# Patient Record
Sex: Female | Born: 1955
Health system: Southern US, Community
[De-identification: ages and names within clinical notes are randomized; demographics above are authoritative.]

## PROBLEM LIST (undated history)

## (undated) DIAGNOSIS — R569 Unspecified convulsions: Secondary | ICD-10-CM

## (undated) DIAGNOSIS — R0683 Snoring: Secondary | ICD-10-CM

## (undated) DIAGNOSIS — R112 Nausea with vomiting, unspecified: Secondary | ICD-10-CM

## (undated) DIAGNOSIS — Z98811 Dental restoration status: Secondary | ICD-10-CM

## (undated) DIAGNOSIS — I10 Essential (primary) hypertension: Secondary | ICD-10-CM

## (undated) DIAGNOSIS — F32A Depression, unspecified: Secondary | ICD-10-CM

## (undated) DIAGNOSIS — M26609 Unspecified temporomandibular joint disorder, unspecified side: Secondary | ICD-10-CM

## (undated) DIAGNOSIS — F329 Major depressive disorder, single episode, unspecified: Secondary | ICD-10-CM

## (undated) DIAGNOSIS — J302 Other seasonal allergic rhinitis: Secondary | ICD-10-CM

## (undated) DIAGNOSIS — Z9889 Other specified postprocedural states: Secondary | ICD-10-CM

## (undated) DIAGNOSIS — Z860100 Personal history of colon polyps, unspecified: Secondary | ICD-10-CM

## (undated) DIAGNOSIS — K219 Gastro-esophageal reflux disease without esophagitis: Secondary | ICD-10-CM

## (undated) DIAGNOSIS — F419 Anxiety disorder, unspecified: Secondary | ICD-10-CM

## (undated) DIAGNOSIS — G5601 Carpal tunnel syndrome, right upper limb: Secondary | ICD-10-CM

## (undated) DIAGNOSIS — Z973 Presence of spectacles and contact lenses: Secondary | ICD-10-CM

## (undated) DIAGNOSIS — M8580 Other specified disorders of bone density and structure, unspecified site: Secondary | ICD-10-CM

## (undated) HISTORY — DX: Personal history of colon polyps, unspecified: Z86.0100

## (undated) HISTORY — DX: Depression, unspecified: F32.A

## (undated) HISTORY — DX: Other specified disorders of bone density and structure, unspecified site: M85.80

## (undated) HISTORY — PX: LUMBAR DISC SURGERY: SHX700

## (undated) HISTORY — PX: TONSILLECTOMY AND ADENOIDECTOMY: SUR1326

## (undated) HISTORY — PX: CARDIAC CATHETERIZATION: SHX172

## (undated) HISTORY — PX: WISDOM TOOTH EXTRACTION: SHX21

## (undated) HISTORY — DX: Personal history of colonic polyps: Z86.010

## (undated) HISTORY — PX: SPINE SURGERY: SHX786

## (undated) HISTORY — DX: Gastro-esophageal reflux disease without esophagitis: K21.9

## (undated) HISTORY — DX: Major depressive disorder, single episode, unspecified: F32.9

## (undated) HISTORY — DX: Anxiety disorder, unspecified: F41.9

---

## 1992-07-31 HISTORY — PX: TUBAL LIGATION: SHX77

## 1998-05-03 ENCOUNTER — Other Ambulatory Visit: Admission: RE | Admit: 1998-05-03 | Discharge: 1998-05-03 | Payer: Self-pay | Admitting: Obstetrics and Gynecology

## 1999-07-07 ENCOUNTER — Other Ambulatory Visit: Admission: RE | Admit: 1999-07-07 | Discharge: 1999-07-07 | Payer: Self-pay | Admitting: Obstetrics and Gynecology

## 2000-10-22 ENCOUNTER — Other Ambulatory Visit: Admission: RE | Admit: 2000-10-22 | Discharge: 2000-10-22 | Payer: Self-pay | Admitting: Obstetrics and Gynecology

## 2001-12-06 ENCOUNTER — Other Ambulatory Visit: Admission: RE | Admit: 2001-12-06 | Discharge: 2001-12-06 | Payer: Self-pay | Admitting: Obstetrics and Gynecology

## 2002-07-17 ENCOUNTER — Emergency Department (HOSPITAL_COMMUNITY): Admission: EM | Admit: 2002-07-17 | Discharge: 2002-07-17 | Payer: Self-pay | Admitting: Emergency Medicine

## 2002-07-18 ENCOUNTER — Emergency Department (HOSPITAL_COMMUNITY): Admission: EM | Admit: 2002-07-18 | Discharge: 2002-07-19 | Payer: Self-pay | Admitting: Emergency Medicine

## 2002-11-29 ENCOUNTER — Encounter (INDEPENDENT_AMBULATORY_CARE_PROVIDER_SITE_OTHER): Payer: Self-pay | Admitting: Internal Medicine

## 2002-11-29 LAB — CONVERTED CEMR LAB

## 2002-12-08 ENCOUNTER — Other Ambulatory Visit: Admission: RE | Admit: 2002-12-08 | Discharge: 2002-12-08 | Payer: Self-pay | Admitting: Obstetrics and Gynecology

## 2003-04-27 ENCOUNTER — Encounter: Admission: RE | Admit: 2003-04-27 | Discharge: 2003-04-27 | Payer: Self-pay | Admitting: Family Medicine

## 2003-04-27 ENCOUNTER — Encounter: Payer: Self-pay | Admitting: Family Medicine

## 2003-05-15 ENCOUNTER — Ambulatory Visit (HOSPITAL_COMMUNITY): Admission: RE | Admit: 2003-05-15 | Discharge: 2003-05-15 | Payer: Self-pay | Admitting: Gastroenterology

## 2003-05-15 ENCOUNTER — Encounter (INDEPENDENT_AMBULATORY_CARE_PROVIDER_SITE_OTHER): Payer: Self-pay | Admitting: Specialist

## 2003-05-15 HISTORY — PX: ESOPHAGEAL DILATION: SHX303

## 2003-10-14 ENCOUNTER — Encounter: Admission: RE | Admit: 2003-10-14 | Discharge: 2003-10-14 | Payer: Self-pay | Admitting: Allergy and Immunology

## 2003-12-29 ENCOUNTER — Other Ambulatory Visit: Admission: RE | Admit: 2003-12-29 | Discharge: 2003-12-29 | Payer: Self-pay | Admitting: Obstetrics and Gynecology

## 2004-07-08 ENCOUNTER — Emergency Department (HOSPITAL_COMMUNITY): Admission: EM | Admit: 2004-07-08 | Discharge: 2004-07-08 | Payer: Self-pay | Admitting: Family Medicine

## 2004-07-14 ENCOUNTER — Ambulatory Visit: Payer: Self-pay | Admitting: Family Medicine

## 2004-09-12 ENCOUNTER — Ambulatory Visit: Payer: Self-pay | Admitting: Family Medicine

## 2004-10-19 ENCOUNTER — Ambulatory Visit: Payer: Self-pay | Admitting: Family Medicine

## 2004-10-25 ENCOUNTER — Encounter: Admission: RE | Admit: 2004-10-25 | Discharge: 2005-01-23 | Payer: Self-pay | Admitting: Family Medicine

## 2005-02-09 ENCOUNTER — Other Ambulatory Visit: Admission: RE | Admit: 2005-02-09 | Discharge: 2005-02-09 | Payer: Self-pay | Admitting: Obstetrics and Gynecology

## 2005-07-06 ENCOUNTER — Emergency Department (HOSPITAL_COMMUNITY): Admission: EM | Admit: 2005-07-06 | Discharge: 2005-07-06 | Payer: Self-pay | Admitting: Family Medicine

## 2005-07-07 ENCOUNTER — Encounter (INDEPENDENT_AMBULATORY_CARE_PROVIDER_SITE_OTHER): Payer: Self-pay | Admitting: Specialist

## 2005-07-07 ENCOUNTER — Inpatient Hospital Stay (HOSPITAL_COMMUNITY): Admission: EM | Admit: 2005-07-07 | Discharge: 2005-07-08 | Payer: Self-pay | Admitting: Emergency Medicine

## 2005-07-07 ENCOUNTER — Encounter: Payer: Self-pay | Admitting: Family Medicine

## 2005-07-07 HISTORY — PX: CHOLECYSTECTOMY: SHX55

## 2005-09-19 ENCOUNTER — Ambulatory Visit: Payer: Self-pay | Admitting: Family Medicine

## 2005-10-18 ENCOUNTER — Ambulatory Visit: Payer: Self-pay | Admitting: Family Medicine

## 2005-11-27 ENCOUNTER — Ambulatory Visit: Payer: Self-pay | Admitting: Family Medicine

## 2006-03-03 ENCOUNTER — Ambulatory Visit: Payer: Self-pay | Admitting: Internal Medicine

## 2006-03-03 ENCOUNTER — Observation Stay (HOSPITAL_COMMUNITY): Admission: EM | Admit: 2006-03-03 | Discharge: 2006-03-04 | Payer: Self-pay | Admitting: Emergency Medicine

## 2006-03-05 ENCOUNTER — Ambulatory Visit: Payer: Self-pay | Admitting: Family Medicine

## 2006-03-21 ENCOUNTER — Ambulatory Visit: Payer: Self-pay

## 2006-08-29 ENCOUNTER — Ambulatory Visit: Payer: Self-pay | Admitting: Family Medicine

## 2006-09-19 ENCOUNTER — Ambulatory Visit: Payer: Self-pay | Admitting: Family Medicine

## 2007-02-21 ENCOUNTER — Encounter (INDEPENDENT_AMBULATORY_CARE_PROVIDER_SITE_OTHER): Payer: Self-pay | Admitting: Internal Medicine

## 2007-02-21 DIAGNOSIS — F410 Panic disorder [episodic paroxysmal anxiety] without agoraphobia: Secondary | ICD-10-CM | POA: Insufficient documentation

## 2007-02-21 DIAGNOSIS — Z860100 Personal history of colon polyps, unspecified: Secondary | ICD-10-CM | POA: Insufficient documentation

## 2007-02-21 DIAGNOSIS — K219 Gastro-esophageal reflux disease without esophagitis: Secondary | ICD-10-CM | POA: Insufficient documentation

## 2007-02-21 DIAGNOSIS — F17200 Nicotine dependence, unspecified, uncomplicated: Secondary | ICD-10-CM | POA: Insufficient documentation

## 2007-02-27 ENCOUNTER — Ambulatory Visit: Payer: Self-pay | Admitting: Family Medicine

## 2007-02-27 DIAGNOSIS — I10 Essential (primary) hypertension: Secondary | ICD-10-CM | POA: Insufficient documentation

## 2007-02-28 LAB — CONVERTED CEMR LAB
BUN: 15 mg/dL (ref 6–23)
CO2: 33 meq/L — ABNORMAL HIGH (ref 19–32)
Calcium: 9.7 mg/dL (ref 8.4–10.5)
Chloride: 102 meq/L (ref 96–112)
Cholesterol: 186 mg/dL (ref 0–200)
Creatinine, Ser: 0.7 mg/dL (ref 0.4–1.2)
GFR calc Af Amer: 113 mL/min
GFR calc non Af Amer: 93 mL/min
Glucose, Bld: 92 mg/dL (ref 70–99)
HDL: 26.9 mg/dL — ABNORMAL LOW (ref >39.0)
LDL Cholesterol: 136 mg/dL — ABNORMAL HIGH (ref 0–99)
Potassium: 3.9 meq/L (ref 3.5–5.1)
Sodium: 140 meq/L (ref 135–145)
TSH: 1.17 microintl units/mL (ref 0.35–5.50)
Total CHOL/HDL Ratio: 6.9
Triglycerides: 115 mg/dL (ref 0–149)
VLDL: 23 mg/dL (ref 0–40)

## 2007-03-05 ENCOUNTER — Encounter (INDEPENDENT_AMBULATORY_CARE_PROVIDER_SITE_OTHER): Payer: Self-pay | Admitting: Internal Medicine

## 2007-03-14 ENCOUNTER — Telehealth (INDEPENDENT_AMBULATORY_CARE_PROVIDER_SITE_OTHER): Payer: Self-pay | Admitting: *Deleted

## 2007-05-08 HISTORY — PX: CARPAL TUNNEL RELEASE: SHX101

## 2007-05-22 ENCOUNTER — Encounter (INDEPENDENT_AMBULATORY_CARE_PROVIDER_SITE_OTHER): Payer: Self-pay | Admitting: Internal Medicine

## 2007-09-11 ENCOUNTER — Ambulatory Visit: Payer: Self-pay | Admitting: Family Medicine

## 2007-09-11 DIAGNOSIS — E782 Mixed hyperlipidemia: Secondary | ICD-10-CM | POA: Insufficient documentation

## 2007-09-12 LAB — CONVERTED CEMR LAB
AST: 19 units/L (ref 0–37)
BUN: 11 mg/dL (ref 6–23)
CO2: 32 meq/L (ref 19–32)
Calcium: 9.8 mg/dL (ref 8.4–10.5)
Chloride: 101 meq/L (ref 96–112)
Cholesterol: 175 mg/dL (ref 0–200)
Creatinine, Ser: 0.7 mg/dL (ref 0.4–1.2)
HDL: 35.7 mg/dL — ABNORMAL LOW (ref >39.0)
LDL Cholesterol: 121 mg/dL — ABNORMAL HIGH (ref 0–99)
Potassium: 3.9 meq/L (ref 3.5–5.1)
Sodium: 139 meq/L (ref 135–145)
Triglycerides: 92 mg/dL (ref 0–149)
VLDL: 18 mg/dL (ref 0–40)

## 2008-03-09 ENCOUNTER — Ambulatory Visit: Payer: Self-pay | Admitting: Family Medicine

## 2008-03-12 LAB — CONVERTED CEMR LAB
BUN: 12 mg/dL (ref 6–23)
Calcium: 9.4 mg/dL (ref 8.4–10.5)
Chloride: 104 meq/L (ref 96–112)
Cholesterol: 194 mg/dL (ref 0–200)
Creatinine, Ser: 0.7 mg/dL (ref 0.4–1.2)
GFR calc Af Amer: 112 mL/min
GFR calc non Af Amer: 93 mL/min
Glucose, Bld: 83 mg/dL (ref 70–99)
HDL: 33.3 mg/dL — ABNORMAL LOW (ref >39.0)
LDL Cholesterol: 145 mg/dL — ABNORMAL HIGH (ref 0–99)
Sodium: 141 meq/L (ref 135–145)
Total CHOL/HDL Ratio: 5.8
Triglycerides: 79 mg/dL (ref 0–149)

## 2008-09-01 ENCOUNTER — Ambulatory Visit: Payer: Self-pay | Admitting: Family Medicine

## 2008-09-03 ENCOUNTER — Telehealth (INDEPENDENT_AMBULATORY_CARE_PROVIDER_SITE_OTHER): Payer: Self-pay | Admitting: Internal Medicine

## 2008-09-03 LAB — CONVERTED CEMR LAB
Cholesterol: 158 mg/dL (ref 0–200)
LDL Cholesterol: 109 mg/dL — ABNORMAL HIGH (ref 0–99)
Total CHOL/HDL Ratio: 5.4
Triglycerides: 101 mg/dL (ref 0–149)
VLDL: 20 mg/dL (ref 0–40)

## 2008-09-15 ENCOUNTER — Ambulatory Visit: Payer: Self-pay | Admitting: Family Medicine

## 2008-09-15 DIAGNOSIS — H04129 Dry eye syndrome of unspecified lacrimal gland: Secondary | ICD-10-CM | POA: Insufficient documentation

## 2009-03-08 ENCOUNTER — Ambulatory Visit: Payer: Self-pay | Admitting: Family Medicine

## 2009-03-10 LAB — CONVERTED CEMR LAB
Cholesterol: 154 mg/dL (ref 0–200)
HDL: 30.9 mg/dL — ABNORMAL LOW (ref >39.00)
LDL Cholesterol: 107 mg/dL — ABNORMAL HIGH (ref 0–99)
Total CHOL/HDL Ratio: 5
Triglycerides: 79 mg/dL (ref 0.0–149.0)
VLDL: 15.8 mg/dL (ref 0.0–40.0)

## 2009-03-16 ENCOUNTER — Ambulatory Visit: Payer: Self-pay | Admitting: Family Medicine

## 2009-03-16 DIAGNOSIS — Z780 Asymptomatic menopausal state: Secondary | ICD-10-CM | POA: Insufficient documentation

## 2009-03-23 ENCOUNTER — Encounter: Payer: Self-pay | Admitting: Family Medicine

## 2009-05-06 LAB — HM MAMMOGRAPHY

## 2009-05-14 ENCOUNTER — Encounter (INDEPENDENT_AMBULATORY_CARE_PROVIDER_SITE_OTHER): Payer: Self-pay | Admitting: Internal Medicine

## 2009-05-14 LAB — HM COLONOSCOPY: HM Colonoscopy: NORMAL

## 2009-05-14 LAB — CONVERTED CEMR LAB: Pap Smear: NORMAL

## 2009-05-14 LAB — HM PAP SMEAR

## 2009-05-27 ENCOUNTER — Encounter (INDEPENDENT_AMBULATORY_CARE_PROVIDER_SITE_OTHER): Payer: Self-pay | Admitting: Internal Medicine

## 2009-07-31 HISTORY — PX: CERVICAL DISC SURGERY: SHX588

## 2009-09-06 ENCOUNTER — Ambulatory Visit: Payer: Self-pay | Admitting: Family Medicine

## 2009-09-06 ENCOUNTER — Encounter: Payer: Self-pay | Admitting: Family Medicine

## 2009-09-07 LAB — CONVERTED CEMR LAB
ALT: 26 units/L (ref 0–35)
AST: 22 units/L (ref 0–37)
CO2: 32 meq/L (ref 19–32)
Calcium: 9.3 mg/dL (ref 8.4–10.5)
Chloride: 106 meq/L (ref 96–112)
Cholesterol: 180 mg/dL (ref 0–200)
Creatinine, Ser: 0.7 mg/dL (ref 0.4–1.2)
GFR calc non Af Amer: 92 mL/min (ref >60)
Glucose, Bld: 86 mg/dL (ref 70–99)
HDL: 39.7 mg/dL (ref >39.00)
Potassium: 3.4 meq/L — ABNORMAL LOW (ref 3.5–5.1)
Sodium: 142 meq/L (ref 135–145)
TSH: 2.24 microintl units/mL (ref 0.35–5.50)
Triglycerides: 105 mg/dL (ref 0.0–149.0)
VLDL: 21 mg/dL (ref 0.0–40.0)
Vit D, 25-Hydroxy: 44 ng/mL (ref 30–89)

## 2009-09-08 ENCOUNTER — Ambulatory Visit: Payer: Self-pay | Admitting: Family Medicine

## 2009-11-23 ENCOUNTER — Ambulatory Visit: Payer: Self-pay | Admitting: Family Medicine

## 2009-12-03 ENCOUNTER — Emergency Department (HOSPITAL_COMMUNITY): Admission: EM | Admit: 2009-12-03 | Discharge: 2009-12-03 | Payer: Self-pay | Admitting: Emergency Medicine

## 2010-02-20 ENCOUNTER — Emergency Department (HOSPITAL_COMMUNITY): Admission: EM | Admit: 2010-02-20 | Discharge: 2010-02-21 | Payer: Self-pay | Admitting: Emergency Medicine

## 2010-02-21 ENCOUNTER — Ambulatory Visit: Payer: Self-pay | Admitting: Cardiology

## 2010-02-21 LAB — CONVERTED CEMR LAB: POC INR: 4.5

## 2010-02-24 ENCOUNTER — Ambulatory Visit: Payer: Self-pay | Admitting: Family Medicine

## 2010-02-24 DIAGNOSIS — M5412 Radiculopathy, cervical region: Secondary | ICD-10-CM | POA: Insufficient documentation

## 2010-02-26 ENCOUNTER — Encounter: Admission: RE | Admit: 2010-02-26 | Discharge: 2010-02-26 | Payer: Self-pay | Admitting: Family Medicine

## 2010-02-28 ENCOUNTER — Ambulatory Visit: Payer: Self-pay | Admitting: Cardiovascular Disease

## 2010-02-28 ENCOUNTER — Encounter: Payer: Self-pay | Admitting: Family Medicine

## 2010-02-28 LAB — CONVERTED CEMR LAB: POC INR: 2.8

## 2010-03-03 ENCOUNTER — Encounter (INDEPENDENT_AMBULATORY_CARE_PROVIDER_SITE_OTHER): Payer: Self-pay | Admitting: *Deleted

## 2010-03-07 ENCOUNTER — Ambulatory Visit: Payer: Self-pay | Admitting: Cardiovascular Disease

## 2010-03-07 LAB — CONVERTED CEMR LAB: POC INR: 3.1

## 2010-03-16 ENCOUNTER — Ambulatory Visit: Payer: Self-pay | Admitting: Cardiology

## 2010-03-16 LAB — CONVERTED CEMR LAB: POC INR: 2.6

## 2010-04-06 ENCOUNTER — Ambulatory Visit: Payer: Self-pay | Admitting: Cardiovascular Disease

## 2010-04-06 LAB — CONVERTED CEMR LAB: POC INR: 1.8

## 2010-04-20 ENCOUNTER — Ambulatory Visit: Payer: Self-pay

## 2010-04-20 ENCOUNTER — Ambulatory Visit: Payer: Self-pay | Admitting: Cardiology

## 2010-04-20 ENCOUNTER — Encounter: Payer: Self-pay | Admitting: Family Medicine

## 2010-04-20 LAB — CONVERTED CEMR LAB: POC INR: 2.4

## 2010-04-22 ENCOUNTER — Telehealth: Payer: Self-pay | Admitting: Family Medicine

## 2010-05-02 ENCOUNTER — Ambulatory Visit: Payer: Self-pay | Admitting: Family Medicine

## 2010-05-02 ENCOUNTER — Encounter: Payer: Self-pay | Admitting: Family Medicine

## 2010-05-23 ENCOUNTER — Ambulatory Visit: Payer: Self-pay | Admitting: Internal Medicine

## 2010-05-23 LAB — CONVERTED CEMR LAB: POC INR: 2.2

## 2010-05-30 ENCOUNTER — Encounter: Admission: RE | Admit: 2010-05-30 | Discharge: 2010-05-30 | Payer: Self-pay | Admitting: Neurosurgery

## 2010-06-13 ENCOUNTER — Ambulatory Visit: Payer: Self-pay | Admitting: Internal Medicine

## 2010-06-13 LAB — CONVERTED CEMR LAB: POC INR: 2.5

## 2010-07-11 ENCOUNTER — Ambulatory Visit: Payer: Self-pay | Admitting: Cardiology

## 2010-07-11 LAB — CONVERTED CEMR LAB: POC INR: 2.8

## 2010-08-02 ENCOUNTER — Telehealth: Payer: Self-pay | Admitting: Family Medicine

## 2010-08-08 ENCOUNTER — Ambulatory Visit: Admission: RE | Admit: 2010-08-08 | Discharge: 2010-08-08 | Payer: Self-pay | Source: Home / Self Care

## 2010-08-08 LAB — CONVERTED CEMR LAB: INR: 2.7

## 2010-09-01 NOTE — Progress Notes (Signed)
Summary: Off Coumadin for surgery   ---- Converted from flag ---- ---- 04/20/2010 9:34 AM, Ruthe Mannan MD wrote: From my standpoint she would be ok off coumadin, but please check with cards too. [name]  ---- 04/20/2010 9:30 AM, Weston Brass PharmD wrote: Dr. Dayton Martes, Taylor Stanley is scheduled to have neck surgery on 05/12/10 and needs to be off of Coumadin x 5 days prior to surgery. Pt is on Coumadin for DVT in June, 2011. Is pt cleared to be off of Coumadin? Does she need bridging with Lovenox? ------------------------------  Phone Note Outgoing Call   Call placed by: Weston Brass PharmD,  April 22, 2010 9:30 AM Call placed to: Patient Summary of Call: Spoke with pt.  She is aware that it is okay to hold her Coumadin.  She has instructions from neurologist.  Will restart Coumadin at MDs discretion at the normal dose.  She is scheduled for f/u INR testing on 10/18.  Initial call taken by: Weston Brass PharmD,  April 22, 2010 9:31 AM

## 2010-09-01 NOTE — Assessment & Plan Note (Signed)
Summary: 30 min apptROA XFER FROM [facility]   Vital Signs:  Patient profile:   55 year old female Height:      60.75 inches Weight:      136.25 pounds BMI:     26.05 Temp:     98.3 degrees F oral Pulse rate:   88 / minute Pulse rhythm:   regular BP sitting:   120 / 72  (left arm) Cuff size:   regular  Vitals Entered By: Delilah Shan CMA (AAMA) (September 08, 2009 10:01 AM) CC: 30 minute appointment (BDB)   History of Present Illness: Taylor yo here to establish care with me ([facility] of [facility]).  Tobacco abuse- has smoke 1 ppd for 35 years.  Quit cold [place] years ago, only lasted 5 weeks. Really ready to quit now, taking care of 4 year old grandson, knows its bad for him to be around smoke.  Tried Chantix, made her feel "crazy."  No SI or HI, had strange dreams and jitteryness. Has never tried patches or gums.  Anxiety/depression- on Zoloft 50 mg daily, feels like its working well, takes the edge off. Also trying to walk more to reduce stress.    HLD- FLP this week- LDL 119 (inc from 107), HDL improved to 39, TG stable. Still taking Lovaza 1 gm daily.  No issues with it, admits to eating very fatty foods over past several months during holidays.  Wants to do better.  wil start walking more as well.  Osteopenia- Dexa in 10/10 showed osteopenia (-1.1 in left femoral neck), otherwise normal. Vit D normal, continues to take Vit D 1000 units daily.  HTN- stable on Benicar- HCT 40-12.5 mg daily.  No CP, SOB, LE edema, blurred vision.  Well woman- UTD colonscopy. Mammogram, DEXA, pap all in 10/10 (Dr. Huntley Dec).  Current Medications (verified): 1)  Clarinex 5 Mg Tabs (Desloratadine) .... Take 1 Tablet By Mouth Once A Day 2)  Benicar Hct 40-12.5 Mg  Tabs (Olmesartan Medoxomil-Hctz) .... Take One By Mouth Once A Day 3)  Nitrostat 0.4 Mg  Subl (Nitroglycerin) .... As Needed 4)  Asmanex 60 Metered Doses 220 Mcg/inh  Aepb (Mometasone Furoate) .Marland Kitchen.. 1 Puff  Once Daily 5)  Nasonex 50 Mcg/act   Susp (Mometasone Furoate) .Marland Kitchen.. 1 Spray in Each Nostril Once Daily 6)  Nexium 40 Mg  Cpdr (Esomeprazole Magnesium) .... Take 1 Tablet By Mouth Once A Day 7)  Lovaza 1 Gm Caps (Omega-3-Acid Ethyl Esters) .... Take 4 Caps Once Daily--May Do in Divided Dose 8)  Zoloft 100 Mg Tabs (Sertraline Hcl) .... Take One Half  By Mouth Daily 9)  Restasis 0.05 % Emul (Cyclosporine) .Marland Kitchen.. 1 Drop Two Times A Day Both Eyes 10)  Vitamin E 600 Unit  Caps (Vitamin E) .... 200 International Units Daily 11)  Vitamin D 1000 Unit  Tabs (Cholecalciferol) .... Once Daily 12)  Wellbutrin Sr 150 Mg Xr12h-Tab (Bupropion Hcl) .Marland Kitchen.. 1 Tab Po Once Daily For 3 Days; Increase To 1 Tab Po  Twice Daily;  Continue For 7-12 Weeks  Allergies: 1)  ! Penicillin V Potassium (Penicillin V Potassium) 2)  Lodine  Review of Systems      See HPI General:  Denies chills, fever, and malaise. Eyes:  Denies blurring. ENT:  Denies difficulty swallowing. CV:  Denies chest pain or discomfort, difficulty breathing at night, difficulty breathing while lying down, leg cramps with exertion, and lightheadness. Resp:  Denies cough and shortness of breath. GI:  Denies abdominal pain,  bloody stools, and change in bowel habits. GU:  Denies abnormal vaginal bleeding and discharge. MS:  Denies joint pain, joint redness, and joint swelling. Derm:  Denies rash. Neuro:  Denies visual disturbances and weakness. Psych:  Denies anxiety and depression. Endo:  Denies cold intolerance and heat intolerance.  Physical Exam  General:  alert, well-developed, well-nourished, and well-hydrated.   Eyes:  pupils equal, pupils round, and no injection.   Mouth:  Oral mucosa and oropharynx without lesions or exudates.  Teeth in good repair. Lungs:  normal respiratory effort, no intercostal retractions, no accessory muscle use, and normal breath sounds.   Heart:  normal rate, regular rhythm, and no murmur.   Extremities:  no edema Skin:  turgor normal and color normal.    Psych:  normally interactive, flat affect, and subdued.     Impression & Recommendations:  Problem # 1:  HYPERLIPIDEMIA (ICD-272.2) Assessment Deteriorated Deteriorated slightly.  WIll not change meds at this time, still within goal of <130.  Discussed diet and exercise.  Recheck in August. Her updated medication list for this problem includes:    Lovaza 1 Gm Caps (Omega-3-acid ethyl esters) .Marland Kitchen... Take 4 caps once daily--may do in divided dose  Problem # 2:  TOBACCO ABUSE (ICD-305.1) Assessment: Unchanged Ready to quit!  Time spent with patient 35 minutes, more than 50% of this time was spent counseling patient on tobacco cessation. Will try Zyban in addition to gums/patches.  Set a quit date for 4 weeks from today.  She will f/u with me in 2 months.  Problem # 3:  OSTEOPENIA-DEXA (ICD-733.90) Assessment: Unchanged Advised to add 1000 mgs daily of Calcium to her current Vit D supplementation. Her updated medication list for this problem includes:    Vitamin D 1000 Unit Tabs (Cholecalciferol) ..... Once daily  Problem # 4:  HYPERTENSION, BENIGN ESSENTIAL, CONTROLLED (ICD-401.1) Assessment: Unchanged Controlled.  Continue current meds. Her updated medication list for this problem includes:    Benicar Hct 40-12.5 Mg Tabs (Olmesartan medoxomil-hctz) .Marland Kitchen... Take one by mouth once a day  Complete Medication List: 1)  Clarinex 5 Mg Tabs (Desloratadine) .... Take 1 tablet by mouth once a day 2)  Benicar Hct 40-12.5 Mg Tabs (Olmesartan medoxomil-hctz) .... Take one by mouth once a day 3)  Nitrostat 0.4 Mg Subl (Nitroglycerin) .... As needed 4)  Asmanex 60 Metered Doses 220 Mcg/inh Aepb (Mometasone furoate) .Marland Kitchen.. 1 puff  once daily 5)  Nasonex 50 Mcg/act Susp (Mometasone furoate) .Marland Kitchen.. 1 spray in each nostril once daily 6)  Nexium 40 Mg Cpdr (Esomeprazole magnesium) .... Take 1 tablet by mouth once a day 7)  Lovaza 1 Gm Caps (Omega-3-acid ethyl esters) .... Take 4 caps once daily--may do in  divided dose 8)  Zoloft 100 Mg Tabs (Sertraline hcl) .... Take one half  by mouth daily 9)  Restasis 0.05 % Emul (Cyclosporine) .Marland Kitchen.. 1 drop two times a day both eyes 10)  Vitamin E 600 Unit Caps (Vitamin e) .... 200 international units daily 11)  Vitamin D 1000 Unit Tabs (Cholecalciferol) .... Once daily 12)  Wellbutrin Sr 150 Mg Xr12h-tab (Bupropion hcl) .Marland Kitchen.. 1 tab po once daily for 3 days; increase to 1 tab po  twice daily;  continue for 7-12 weeks  Patient Instructions: 1)  Nice to meet you, Ms.Seckinger. 2)  Please come back to see me in 2 months to discuss your smoking. 3)  Stop smoking tips: Choose a quit date. Cut down before the quit date. Decide what  you will do as a substitute when you feel the urge to smoke(gum, toothpick, exercise).  4)  Make a lab appointment on the way out to recheck your cholesterol. Prescriptions: WELLBUTRIN SR 150 MG XR12H-TAB (BUPROPION HCL) 1 tab po once daily for 3 days; increase to 1 tab po  twice daily;  continue for 7-12 weeks  #60 x 3   Entered and Authorized by:   Ruthe Mannan MD   Signed by:   Ruthe Mannan MD on 09/08/2009   Method used:   [facility]        [facility] [address] [phone]* (retail)       [address]       [place], [place]  [address]       Ph: [phone] or [phone]       Fax: [phone]   RxID:   [id number]   Current Allergies (reviewed today): ! PENICILLIN V POTASSIUM (PENICILLIN V POTASSIUM) LODINE  Flex Sig Next Due:  Not Indicated Hemoccult Next Due:  Not Indicated Last PAP:  Done/GYN (11/29/2002 10:22:47 AM) PAP Result Date:  05/14/2009 PAP Result:  normal PAP Next Due:  2 yr Last Mammogram:  Done/GYN (01/28/2006 10:22:47 AM) Mammogram Result Date:  05/06/2009 Mammogram Result:  normal Mammogram Next Due:  1 yr

## 2010-09-01 NOTE — Progress Notes (Signed)
Summary: LOVAZA   Phone Note Refill Request Message from:  [phone] on August 02, 2010 2:18 PM  Refills Requested: Medication #1:  LOVAZA 1 GM CAPS take 4 caps once daily--may do in divided dose [BMN]   Last Refilled: 08/18/2009 ok to refill?    Method Requested: Electronic Initial call taken by: Mervin Hack CMA (AAMA),  August 02, 2010 2:18 PM    Prescriptions: LOVAZA 1 GM CAPS (OMEGA-3-ACID ETHYL ESTERS) take 4 caps once daily--may do in divided dose Brand medically necessary #360 x 1   Entered and Authorized by:   Ruthe Mannan MD   Signed by:   Ruthe Mannan MD on 08/02/2010   Method used:   [facility]        [facility] [address] [phone]* (retail)       [address]       [place], [place]  [address]       Ph: [phone] or [phone]       Fax: [phone]   RxID:   [id number]

## 2010-09-01 NOTE — Medication Information (Signed)
Summary: Taylor Stanley  Anticoagulant Therapy  Managed by: Bethena Midget, RN, BSN PCP: Clifton Custard MD, Virgilio Belling MD: Eden Emms MD,Peter Indication 1: DVT Lab Used: [facility] [place] Site: [address] INR POC 1.8 INR RANGE 2.0-3.0  Dietary changes: no    Health status changes: no    Bleeding/hemorrhagic complications: no    Recent/future hospitalizations: no    Any changes in medication regimen? no    Recent/future dental: no  Any missed doses?: yes     Details: missed 1/2 yesterday  Is patient compliant with meds? yes       Allergies: 1)  ! Penicillin V Potassium (Penicillin V Potassium) 2)  Lodine  Anticoagulation Management History:      The patient is taking warfarin and comes in today for a routine follow up visit.  Negative risk factors for bleeding include an age less than 71 years old.  The bleeding index is 'low risk'.  Positive CHADS2 values include History of HTN.  Negative CHADS2 values include Age > 29 years old.  Anticoagulation responsible provider: Eden Emms MD,Peter.  INR POC: 1.8.  Cuvette Lot#: [id number].  Exp: 05/2011.    Anticoagulation Management Assessment/Plan:      The patient's current anticoagulation dose is Coumadin 5 mg tabs: take as directed by [facility].  The target INR is 2.0-3.0.  The next INR is due 04/20/2010.  Anticoagulation instructions were given to patient.  Results were reviewed/authorized by Bethena Midget, RN, BSN.         Prior Anticoagulation Instructions: INR 2.6  Continue taking 1 tablet (5mg ) every day except take 1.5 tablets (7.5mg ) on Tuesdays, Thursdays, and Saturdays.  Recheck in 4 weeks.   Current Anticoagulation Instructions: INR 1.8 Since you missed the 1/2 tablet yesterday, take 1.5 tablets today. Then go back to regular schedule. We'll check again in 2 weeks to see if you're back in range.

## 2010-09-01 NOTE — Medication Information (Signed)
Summary: rov/ewj  Anticoagulant Therapy  Managed by: Lyna Poser, PharmD PCP: Clifton Custard MD, Virgilio Belling MD: Ladona Ridgel MD, Sharlot Gowda Indication 1: DVT Lab Used: [facility] [place] Site: [address] INR POC 2.5 INR RANGE 2.0-3.0  Dietary changes: no    Health status changes: yes       Details: back surgery on october 12th  Bleeding/hemorrhagic complications: no    Recent/future hospitalizations: no    Any changes in medication regimen? no    Recent/future dental: yes     Details: wisdom tooth needs to be taken out but hasn't scheduled   Any missed doses?: no       Is patient compliant with meds? yes       Allergies: 1)  ! Penicillin V Potassium (Penicillin V Potassium) 2)  Lodine  Anticoagulation Management History:      The patient is taking warfarin and comes in today for a routine follow up visit.  Negative risk factors for bleeding include an age less than 95 years old.  The bleeding index is 'low risk'.  Positive CHADS2 values include History of HTN.  Negative CHADS2 values include Age > 46 years old.  Anticoagulation responsible Derald Lorge: Ladona Ridgel MD, Sharlot Gowda.  INR POC: 2.5.  Cuvette Lot#: [id number].  Exp: 06/2011.    Anticoagulation Management Assessment/Plan:      The patient's current anticoagulation dose is Coumadin 5 mg tabs: take as directed by Coumadin Clinic.  The target INR is 2.0-3.0.  The next INR is due 07/11/2010.  Anticoagulation instructions were given to patient.  Results were reviewed/authorized by Lyna Poser, PharmD.         Prior Anticoagulation Instructions: INR 2.2  Continue on same dosage 1 tablet daily except 1/2 tablet on Tuesdays, Thursdays, and Saturdays.  Recheck in 3 weeks.    Current Anticoagulation Instructions: INR 2.5 continue taking a half tablet on tuesday, thursday, and saturday. Recheck in 4 weeks.

## 2010-09-01 NOTE — Medication Information (Signed)
Summary: rov/tm  Anticoagulant Therapy  Managed by: Bethena Midget, RN, BSN PCP: Clifton Custard MD, Virgilio Belling MD: Clifton James MD, Cristal Deer Indication 1: DVT Lab Used: [facility] [place] Site: [address] INR POC 3.1 INR RANGE 2.0-3.0  Dietary changes: no    Health status changes: no    Bleeding/hemorrhagic complications: no    Recent/future hospitalizations: no    Any changes in medication regimen? no    Recent/future dental: no  Any missed doses?: no       Is patient compliant with meds? yes       Allergies: 1)  ! Penicillin V Potassium (Penicillin V Potassium) 2)  Lodine  Anticoagulation Management History:      The patient is taking warfarin and comes in today for a routine follow up visit.  Negative risk factors for bleeding include an age less than 6 years old.  The bleeding index is 'low risk'.  Positive CHADS2 values include History of HTN.  Negative CHADS2 values include Age > 13 years old.  Anticoagulation responsible Jehu Mccauslin: Clifton James MD, Cristal Deer.  INR POC: 3.1.  Cuvette Lot#: [id number].  Exp: 05/2011.    Anticoagulation Management Assessment/Plan:      The patient's current anticoagulation dose is Coumadin 5 mg tabs: take as directed by [facility]in Clinic.  The target INR is 2.0-3.0.  The next INR is due 03/16/2010.  Anticoagulation instructions were given to patient.  Results were reviewed/authorized by Bethena Midget, RN, BSN.  She was notified by Bethena Midget, RN, BSN.         Prior Anticoagulation Instructions: INR 2.8 Continue 5mg s everyday except 2.5mg s on Tuesdays and Saturdays. Recheck in one week.   Current Anticoagulation Instructions: INR 3.1 Change dose 1 tablet everyday except 1/2 tablet on Tuesdays, Thursdays and Saturdays. Recheck in 10 days

## 2010-09-01 NOTE — Medication Information (Signed)
Summary: rov/tm  Anticoagulant Therapy  Managed by: Bethena Midget, RN, BSN PCP: Clifton Custard MD, Virgilio Belling MD: Juanda Chance MD, Bruce Indication 1: DVT Lab Used: [facility] [place] Site: [address] INR POC 2.6 INR RANGE 2.0-3.0  Dietary changes: no    Health status changes: no    Bleeding/hemorrhagic complications: no    Recent/future hospitalizations: no    Any changes in medication regimen? no    Recent/future dental: no  Any missed doses?: yes     Details: Might of taken a 1/2 instead of a whole tablet one day last Monday.    Is patient compliant with meds? yes       Allergies: 1)  ! Penicillin V Potassium (Penicillin V Potassium) 2)  Lodine  Anticoagulation Management History:      The patient is taking warfarin and comes in today for a routine follow up visit.  Negative risk factors for bleeding include an age less than 30 years old.  The bleeding index is 'low risk'.  Positive CHADS2 values include History of HTN.  Negative CHADS2 values include Age > 3 years old.  Anticoagulation responsible provider: Juanda Chance MD, Smitty Cords.  INR POC: 2.6.  Cuvette Lot#: [id number].  Exp: 05/2011.    Anticoagulation Management Assessment/Plan:      The patient's current anticoagulation dose is Coumadin 5 mg tabs: take as directed by [facility]in Clinic.  The target INR is 2.0-3.0.  The next INR is due 04/06/2010.  Anticoagulation instructions were given to patient.  Results were reviewed/authorized by Bethena Midget, RN, BSN.  She was notified by Gweneth Fritter, PharmD Candidate.         Prior Anticoagulation Instructions: INR 3.1 Change dose 1 tablet everyday except 1/2 tablet on Tuesdays, Thursdays and Saturdays. Recheck in 10 days  Current Anticoagulation Instructions: INR 2.6  Continue taking 1 tablet (5mg ) every day except take 1.5 tablets (7.5mg ) on Tuesdays, Thursdays, and Saturdays.  Recheck in 4 weeks.

## 2010-09-01 NOTE — Miscellaneous (Signed)
Summary: ER visit  Zeriyah, Wain MRN: [id number] Acct#: [id number] PHYSICIAN DOCUMENTATION SHEET Wed Jul 27 13:38:04 EDT 2011 Eligha Bridegroom. [facility] [address] [place], [place] [address] PHONE: [phone] MRN: [id number] Account #: [id number] Name: Taylor Stanley, Taylor Stanley Sex: F Age: 55 DOB: Jan 23, 1956 Complaint: Shoulder pain Primary Diagnosis: Cervical radiculopathy Arrival Time: 02/20/2010 22:37 Discharge Time: 02/21/2010 04:11 All Providers: Dr. Leonette Most "Italy" Bernette Mayers - MD PROVIDER: Dr. Leonette Most "Italy" Bernette Mayers - MD HPI: The patient is a 55 year old female who presents with a chief complaint of shoulder pain. The history was provided by the patient. Pt reports several days of worsening moderate to severe aching pain in R shoulder/neck and scapula radiating down her R arm and associated with numbness in that arm. She has known bulging disks in her neck but also recently diagnosed with DVT in R popliteal area. She has finished lovenox and now taking coumadin. Unsure what her last INR was. She reports some occasional mild chest pains and difficulty breathing. She is concerned about possible PE. 00:08 02/21/2010 by Charles "Italy" Sheldon - MD, Dr. Linus Orn: Statement: all systems negative except as marked or noted in the HPI 00:08 02/21/2010 by Leonette Most "Italy" Bernette Mayers - MD, Dr. [facility]: Documentation: physician reviewed/amended Historian: patient Patient's Current Physicians Patient's Current Physicians (please list PCP first) [facility], - General Internal [facility] Past medical history: hypertension, anxiety, DVT Surgical History: back surgery, caesarean section, cholecystectomy NOTE - L hand surgery Social History: non-drinker, no drug abuse, cigarette smoker Travel History: no recent air travel Contraception: nothing Immunization status: tetanus unknown Special Needs: no barriers to learning 1 Akaisha, Truman  MRN: [id number] Acct#: [id number] Allergies Drug Reaction Allergy Note Penicillins localized swelling 00:08 02/21/2010 by Charles "Italy" Sheldon - MD, Dr. Physical examination: Vital signs and O2 SAT: reviewed Constitutional: well developed, well nourished, in no acute distress Head and Face: normocephalic, atraumatic Eyes: EOMI, PERRL ENMT: ears, nose and throat normal, mouth and pharynx normal Neck: supple, full range of motion, no lymphadenopathy Spine: cervical spine non-tender, cervical paraspinal tenderness Cardiovascular: regular rate and rhythm Respiratory: no rales, rhonchi, wheezes, or rub, normal respiratory effort/excursion, breath sounds clear and equal bilaterally Abdomen: soft, nontender, nondistended, normal bowel sounds Extremities: full range of motion, pulses normal, no edema Neuro: AA&Ox3, Cranial Nerves II-XII intact, motor intact in all extremities, sensation normal Skin: no rash Psychiatric: no abnormalities of mood or affect, AA&Ox3 00:08 02/21/2010 by Charles "Italy" Sheldon - MD, Dr. Reviewed result: Result Type: Cleda Daub: [id number] Step Type: LAB Procedure Name: I STAT CHM 8 PANEL Procedure: I STAT CHM 8 PANEL Result: SODIUM 141 mEq/L [135-145] POTASSIUM 3.4 mEq/L [3.5-5.1] L CHLORIDE 104 mEq/L [96-112] BUN 16 mg/dL [6-43] CREATININE 0.8 mg/dL [3.2-9.5] GLUCOSE 188 mg/dL [41-66] H CALCIUM, IONIZED 1.15 mmol/L [1.12-1.32] TCO2 28 mmol/L [0-100] HEMOGLOBIN 12.6 g/dL [06.3-01.6] HEMATOCRIT 37.0 % [36.0-46.0] 00:28 02/21/2010 by Charles "Italy" Sheldon - MD, Dr. Reviewed result: Result Type: Cleda Daub: [id number] Step Type: LAB Procedure Name: PROTHROMBIN TIME 2 Tinia, Oravec MRN: [id number] Acct#: [id number] Procedure: PROTHROMBIN TIME Result: PROTHROMBIN TIME 33.7 seconds [11.6-15.2] H INR 3.35 [0.00-1.49] H 00:46 02/21/2010 by Charles "Italy" Sheldon - MD, Dr. Reviewed result: Result Type: Cleda Daub:  [id number] Step Type: XRAY Procedure Name: CT ANGIO CHEST W/CM &/OR WO/CM Procedure: CT ANGIO CHEST W/CM &/OR WO/CM Result: Clinical Data: Shortness of breath, back pain. History of deep venous thrombosis diagnosed 2 weeks ago. CT ANGIOGRAPHY CHEST WITH CONTRAST  Technique: Multidetector CT imaging of the chest was performed using the standard protocol during bolus administration of intravenous contrast. Multiplanar CT image reconstructions including MIPs were obtained to evaluate the vascular anatomy. Contrast: 100 ml Omniscan 350 IV contrast Comparison: Chest radiograph 03/03/2006 Findings: Apparent peripheral filling defect within a left upper lobe subsegmental pulmonary artery is compatible with volume averaging with adjacent bronchus with bronchial wall thickening. The study is of adequate technical quality for evaluation for pulmonary embolism up to and including the 3rd order pulmonary arteries. No focal filling defect is seen to suggest acute pulmonary embolism. Areas of central bronchial wall thinning and subsegmental bronchial mucoid impaction noted. Heart size is normal. No pericardial or pleural effusion. No lymphadenopathy. Several small right hilar nodes are identified, representative right hilar lymph node measuring 0.7 cm on image 38. Mild motion artifact is noted at the bases with subpleural atelectasis noted. Central airways are patent. No acute bony finding. Review of the MIP images confirms the above findings. 3 Sicilia, Killough MRN: [id number] Acct#: [id number] IMPRESSION: No CT evidence for acute pulmonary embolism. Mild central bronchial wall thickening and a few tiny areas of mucoid impaction may indicate bronchitis, reactive airways disease, or atypical infection. Borderline prominent right hilar lymph nodes, possibly reactive. 02:21 02/21/2010 by Charles "Italy" Sheldon - MD, Dr. ED Course: Comments: CTA neg for PE. Likely cervical radiculopathy.  Neurosurgery followup. 02:30 02/21/2010 by Charles "Italy" Sheldon - MD, Dr. Patient disposition: Patient disposition: Disch - Home Primary Diagnosis: cervical radiculopathy 02:30 02/21/2010 by Charles "Italy" Sheldon - MD, Dr. Prescriptions: Prescription Medication Dispense Sig Line Flexeril 10 mg Tab #15 1 by mouth three times a day as needed for muscle spasm Percocet 5 mg-325 mg Tab Twenty (20) One-two every six hours as needed for pain. Drug interactions: Drug interactions Moderate Zoloft Oral Flexeril Oral SSRI'S; SELECTED SNRIS/TRICYCLIC COMPOUNDS; TRAZODONE Moderate Coumadin Oral Percocet Oral SELECTED ANTICOAGULANTS/ ACETAMINOPHEN 02:30 02/21/2010 by Charles "Italy" Sheldon - MD, Dr. Medication disposition: 4 Jyssica, Rief MRN: [id number] Acct#: [id number] Medications Medication [Medication] Dosage Frequency Last Dose Medication disposition PCP contact Asmanex Twisthaler Inhl continue Benicar Oral continue Claritin Oral continue Coumadin Oral continue Lovaza Oral continue NexIUM Oral continue Wellbutrin Oral continue Zoloft Oral continue 02:30 02/21/2010 by Charles "Italy" Sheldon - MD, Dr. Discharge: Discharge Instructions: herniated disk Referral/Appointment Refer Patient To: Phone Number: Follow-up in Appointment Details: Mila [facility], [facility] [phone] Doctor, Your Own Drug Instructions: pain acetaminophen oxycodone, pain muscle relaxant flexeril 02:31 02/21/2010 by Charles "Italy" Sheldon - MD, Dr. Chart electronically signed by Responsible Physician 23:19 02/21/2010 by Charles "Italy" Sheldon - MD, Dr. REVIEWER: Karleen Hampshire - Reviewer Review completed: Documentation completed 13:38 02/23/2010 by Karleen Hampshire - Reviewer 5Clinical Lists Changes

## 2010-09-01 NOTE — Assessment & Plan Note (Signed)
Summary: 2 M F/U DLO   Vital Signs:  Patient profile:   55 year old female Height:      60.75 inches Weight:      139.50 pounds BMI:     26.67 Temp:     98.2 degrees F oral Pulse rate:   80 / minute Pulse rhythm:   regular BP sitting:   146 / 90  (left arm) Cuff size:   regular  Vitals Entered By: Delilah Shan CMA Duncan Dull) (November 23, 2009 9:11 AM) CC: 2 months follow up   History of Present Illness: 55 yo here for follow up smoking cessation.   Tobacco abuse- has smoke 1 ppd for 35 years, started Buproprion 150 mg bid 2 months ago.  Has cut back to 6 cigs a day, she really thinks it is helping.  Often forgets to take the second dose.  No issues with insomnia since starting bid dosing.  Quit cold [place] years ago, only lasted 5 weeks. Really ready to quit now, taking care of 37 year old grandson all summer, knows its bad for him to be around smoke.  Tried Chantix, made her feel "crazy."  No SI or HI, had strange dreams and jitteryness. Has never tried patches or gums.    Current Medications (verified): 1)  Clarinex 5 Mg Tabs (Desloratadine) .... Take 1 Tablet By Mouth Once A Day 2)  Benicar Hct 40-12.5 Mg  Tabs (Olmesartan Medoxomil-Hctz) .... Take One By Mouth Once A Day 3)  Nitrostat 0.4 Mg  Subl (Nitroglycerin) .... As Needed 4)  Asmanex 60 Metered Doses 220 Mcg/inh  Aepb (Mometasone Furoate) .Marland Kitchen.. 1 Puff  Once Daily 5)  Nasonex 50 Mcg/act  Susp (Mometasone Furoate) .Marland Kitchen.. 1 Spray in Each Nostril Once Daily 6)  Nexium 40 Mg  Cpdr (Esomeprazole Magnesium) .... Take 1 Tablet By Mouth Once A Day 7)  Lovaza 1 Gm Caps (Omega-3-Acid Ethyl Esters) .... Take 4 Caps Once Daily--May Do in Divided Dose 8)  Zoloft 100 Mg Tabs (Sertraline Hcl) .... Take One Half  By Mouth Daily 9)  Vitamin E 600 Unit  Caps (Vitamin E) .... 200 International Units Daily 10)  Vitamin D 1000 Unit  Tabs (Cholecalciferol) .... Once Daily 11)  Wellbutrin Sr 150 Mg Xr12h-Tab (Bupropion Hcl) .Marland Kitchen.. 1 Tab Po  Twice  Daily  Allergies: 1)  ! Penicillin V Potassium (Penicillin V Potassium) 2)  Lodine  Review of Systems      See HPI Psych:  Denies anxiety and depression.  Physical Exam  General:  alert, well-developed, well-nourished, and well-hydrated.   Psych:  normally interactive, flat affect, and subdued.     Impression & Recommendations:  Problem # 1:  TOBACCO ABUSE (ICD-305.1) Assessment Improved Time spent with patient 25 minutes, more than 50% of this time was spent counseling patient on smoking cessation. She is really motivated now, often forgets second dose of Wellbutrin. discussed strategies to help, she will try taking it at lunch instead of before bedtime. Also will start walking in the evenings since she finds she smokes the most in the evening. Follow up in 1-2 months.  Complete Medication List: 1)  Clarinex 5 Mg Tabs (Desloratadine) .... Take 1 tablet by mouth once a day 2)  Benicar Hct 40-12.5 Mg Tabs (Olmesartan medoxomil-hctz) .... Take one by mouth once a day 3)  Nitrostat 0.4 Mg Subl (Nitroglycerin) .... As needed 4)  Asmanex 60 Metered Doses 220 Mcg/inh Aepb (Mometasone furoate) .Marland Kitchen.. 1 puff  once daily 5)  Nasonex 50 Mcg/act Susp (Mometasone furoate) .Marland Kitchen.. 1 spray in each nostril once daily 6)  Nexium 40 Mg Cpdr (Esomeprazole magnesium) .... Take 1 tablet by mouth once a day 7)  Lovaza 1 Gm Caps (Omega-3-acid ethyl esters) .... Take 4 caps once daily--may do in divided dose 8)  Zoloft 100 Mg Tabs (Sertraline hcl) .... Take one half  by mouth daily 9)  Vitamin E 600 Unit Caps (Vitamin e) .... 200 international units daily 10)  Vitamin D 1000 Unit Tabs (Cholecalciferol) .... Once daily 11)  Wellbutrin Sr 150 Mg Xr12h-tab (Bupropion hcl) .Marland Kitchen.. 1 tab po  twice daily Prescriptions: WELLBUTRIN SR 150 MG XR12H-TAB (BUPROPION HCL) 1 tab po  twice daily  #90 x 3   Entered and Authorized by:   Ruthe Mannan MD   Signed by:   Ruthe Mannan MD on 11/23/2009   Method used:    Electronically to        [facility] [address] [phone]* (retail)       [address]       [place], [place]  [address]       Ph: [phone] or [phone]       Fax: [phone]   RxID:   [id number]   Current Allergies (reviewed today): ! PENICILLIN V POTASSIUM (PENICILLIN V POTASSIUM) LODINE

## 2010-09-01 NOTE — Medication Information (Signed)
Summary: rov/sp  Anticoagulant Therapy  Managed by: Louann Sjogren, PharmD PCP: Clifton Custard MD, Virgilio Belling MD: Jens Som MD, Arlys John Indication 1: DVT Lab Used: [facility] [place] Site: [address] INR POC 2.7 INR RANGE 2.0-3.0  Dietary changes: no    Health status changes: no    Bleeding/hemorrhagic complications: no    Recent/future hospitalizations: no    Any changes in medication regimen? no    Recent/future dental: no  Any missed doses?: no       Is patient compliant with meds? yes       Allergies: 1)  ! Penicillin V Potassium (Penicillin V Potassium) 2)  Lodine  Anticoagulation Management History:      The patient is taking warfarin and comes in today for a routine follow up visit.  Negative risk factors for bleeding include an age less than 28 years old.  The bleeding index is 'low risk'.  Positive CHADS2 values include History of HTN.  Negative CHADS2 values include Age > 46 years old.  Today's INR is 2.7.  Anticoagulation responsible provider: Jens Som MD, Arlys John.  INR POC: 2.7.  Cuvette Lot#: [id number].  Exp: 08/2011.    Anticoagulation Management Assessment/Plan:      The patient's current anticoagulation dose is Coumadin 5 mg tabs: take as directed by [facility]in Clinic.  The target INR is 2.0-3.0.  The next INR is due 09/05/2010.  Anticoagulation instructions were given to patient.  Results were reviewed/authorized by Louann Sjogren, PharmD.  She was notified by Louann Sjogren PharmD.         Prior Anticoagulation Instructions: INR 2.8  Continue same dose of 1 tablet every day except 1/2 tablet on Tuesday, Thursday and Saturday.  Recheck INR in 4 weeks.   Current Anticoagulation Instructions: INR 2.7 (goal 2-3) Continue taking same dosing schedule below. Next appointment:  Monday, Feb. 6th at 9:00AM.

## 2010-09-01 NOTE — Medication Information (Signed)
Summary: new to coumadin/right calf dvt  Anticoagulant Therapy  Managed by: Bethena Midget, RN, BSN PCP: Clifton Custard MD, Virgilio Belling MD: Riley Kill MD, Maisie Fus Indication 1: DVT Lab Used: [facility] [place] Site: [address] INR POC 4.5 INR RANGE 2.0-3.0  Dietary changes: no    Health status changes: no    Bleeding/hemorrhagic complications: no    Recent/future hospitalizations: no    Any changes in medication regimen? no    Recent/future dental: no  Any missed doses?: yes     Details: started coumadin approximately 3 weeks ago  Is patient compliant with meds? yes      Comments: Dx with DVT at [facility] by Dr Milus Glazier. Took 10 Lovenox injections two times a day along with coumadin, started apporx 3  weeks ago has had it checked at [facility].  Pt to be on coumadin for 6 months. Pt was educated on coumadin use, drug interactions, dietary interactions, and complications to watch for.  Current Medications (verified): 1)  Clarinex 5 Mg Tabs (Desloratadine) .... Take 1 Tablet By Mouth Once A Day 2)  Benicar Hct 40-12.5 Mg  Tabs (Olmesartan Medoxomil-Hctz) .... Take One By Mouth Once A Day 3)  Nitrostat 0.4 Mg  Subl (Nitroglycerin) .... As Needed 4)  Asmanex 60 Metered Doses 220 Mcg/inh  Aepb (Mometasone Furoate) .Marland Kitchen.. 1 Puff  Once Daily 5)  Nasonex 50 Mcg/act  Susp (Mometasone Furoate) .Marland Kitchen.. 1 Spray in Each Nostril Once Daily 6)  Nexium 40 Mg  Cpdr (Esomeprazole Magnesium) .... Take 1 Tablet By Mouth Once A Day 7)  Lovaza 1 Gm Caps (Omega-3-Acid Ethyl Esters) .... Take 4 Caps Once Daily--May Do in Divided Dose 8)  Zoloft 100 Mg Tabs (Sertraline Hcl) .... Take One Half  By Mouth Daily 9)  Vitamin E 600 Unit  Caps (Vitamin E) .... 200 International Units Daily 10)  Vitamin D 1000 Unit  Tabs (Cholecalciferol) .... Once Daily 11)  Wellbutrin Sr 150 Mg Xr12h-Tab (Bupropion Hcl) .Marland Kitchen.. 1 Tab Po  Twice Daily 12)  Oxycodone-Acetaminophen 5-500 Mg Caps  (Oxycodone-Acetaminophen) .... As Needed  Allergies (verified): 1)  ! Penicillin V Potassium (Penicillin V Potassium) 2)  Lodine  Anticoagulation Management History:      The patient comes in today for her initial visit for anticoagulation therapy.  Negative risk factors for bleeding include an age less than 71 years old.  The bleeding index is 'low risk'.  Positive CHADS2 values include History of HTN.  Negative CHADS2 values include Age > 42 years old.  Anticoagulation responsible provider: Riley Kill MD, Maisie Fus.  INR POC: 4.5.  Cuvette Lot#: [id number].  Exp: 05/2011.    Anticoagulation Management Assessment/Plan:      The target INR is 2.0-3.0.  The next INR is due 02/28/2010.  Results were reviewed/authorized by Bethena Midget, RN, BSN.  She was notified by Dillard Cannon.         Current Anticoagulation Instructions: INR 4.5  Hold coumadin today.  Then decrease to 1 tab daily except for 1/2 tab on Tuesday and Saturday.  Re-check in 1 week.  Appended Document: new to coumadin/right calf dvt Apparently started on coumadin and followed in coumadin clinic.  TS

## 2010-09-01 NOTE — Medication Information (Signed)
Summary: rov/mw  Anticoagulant Therapy  Managed by: Weston Brass, PharmD PCP: Clifton Custard MD, Virgilio Belling MD: Shirlee Latch MD, Dalton Indication 1: DVT Lab Used: [facility] [place] Site: [address] INR POC 2.4 INR RANGE 2.0-3.0  Dietary changes: no    Health status changes: no    Bleeding/hemorrhagic complications: no    Recent/future hospitalizations: no    Any changes in medication regimen? no    Recent/future dental: no  Any missed doses?: no       Is patient compliant with meds? yes      Comments: Pt has scheduled back surgery on 10/13 and needs to be off of Coumadin for 5 days (last dose will be 10/7). Dr. Gerlene Fee is doing the back surgery.   Allergies: 1)  ! Penicillin V Potassium (Penicillin V Potassium) 2)  Lodine  Anticoagulation Management History:      The patient is taking warfarin and comes in today for a routine follow up visit.  Negative risk factors for bleeding include an age less than 61 years old.  The bleeding index is 'low risk'.  Positive CHADS2 values include History of HTN.  Negative CHADS2 values include Age > 81 years old.  Anticoagulation responsible provider: Shirlee Latch MD, Dalton.  INR POC: 2.4.  Cuvette Lot#: [id number].  Exp: 06/2011.    Anticoagulation Management Assessment/Plan:      The patient's current anticoagulation dose is Coumadin 5 mg tabs: take as directed by Coumadin Clinic.  The target INR is 2.0-3.0.  The next INR is due 05/17/2010.  Anticoagulation instructions were given to patient.  Results were reviewed/authorized by Weston Brass, PharmD.  She was notified by Harrel Carina, PharmD candidate.         Prior Anticoagulation Instructions: INR 1.8 Since you missed the 1/2 tablet yesterday, take 1.5 tablets today. Then go back to regular schedule. We'll check again in 2 weeks to see if you're back in range.  Current Anticoagulation Instructions: INR 2.4  Continue taking 1 tablet everyday except 1/2 tablet on Tuesdays,  Thursdays, and Saturdays. We will let you know if you are cleared to be off of Coumadin by Dr. Clifton Custard and if she decides to do Lovenox injections while you are off of the Coumadin. If so, your last dose of Coumadin prior to surgery will be 05/06/10. We will call you with further instructions about the Lovenox and how to restart the Coumadin following surgery. Re-check INR on 05/17/10

## 2010-09-01 NOTE — Medication Information (Signed)
Summary: rov/ln  Anticoagulant Therapy  Managed by: Bethena Midget, RN, BSN PCP: Clifton Custard MD, Virgilio Belling MD: Eden Emms MD, Theron Arista Indication 1: DVT Lab Used: [facility] [place] Site: [address] INR POC 2.8 INR RANGE 2.0-3.0  Dietary changes: no    Health status changes: no    Bleeding/hemorrhagic complications: no    Recent/future hospitalizations: no    Any changes in medication regimen? no    Recent/future dental: no  Any missed doses?: no       Is patient compliant with meds? yes       Allergies: 1)  ! Penicillin V Potassium (Penicillin V Potassium) 2)  Lodine  Anticoagulation Management History:      The patient is taking warfarin and comes in today for a routine follow up visit.  Negative risk factors for bleeding include an age less than 60 years old.  The bleeding index is 'low risk'.  Positive CHADS2 values include History of HTN.  Negative CHADS2 values include Age > 58 years old.  Anticoagulation responsible provider: Eden Emms MD, Theron Arista.  INR POC: 2.8.  Cuvette Lot#: [id number].  Exp: 05/2011.    Anticoagulation Management Assessment/Plan:      The patient's current anticoagulation dose is Coumadin 5 mg tabs: take as directed by Coumadin Clinic.  The target INR is 2.0-3.0.  The next INR is due 03/07/2010.  Anticoagulation instructions were given to patient.  Results were reviewed/authorized by Bethena Midget, RN, BSN.  She was notified by Bethena Midget, RN, BSN.         Prior Anticoagulation Instructions: INR 4.5  Hold coumadin today.  Then decrease to 1 tab daily except for 1/2 tab on Tuesday and Saturday.  Re-check in 1 week.  Current Anticoagulation Instructions: INR 2.8 Continue 5mg s everyday except 2.5mg s on Tuesdays and Saturdays. Recheck in one week.

## 2010-09-01 NOTE — Miscellaneous (Signed)
Summary: Orders Update  Clinical Lists Changes  Orders: Added new Referral order of Neurosurgeon Referral (Neurosurgeon) - Signed 

## 2010-09-01 NOTE — Medication Information (Signed)
Summary: Taylor Stanley  Anticoagulant Therapy  Managed by: Cloyde Reams, RN, BSN PCP: Clifton Custard MD, Virgilio Belling MD: Johney Frame MD, Fayrene Fearing Indication 1: DVT Lab Used: [facility] [place] Site: [address] INR POC 2.2 INR RANGE 2.0-3.0  Dietary changes: no    Health status changes: no    Bleeding/hemorrhagic complications: no    Recent/future hospitalizations: yes       Details: Sugery on 05/11/10, off coumadin 6 days prior to.   Any changes in medication regimen? no    Recent/future dental: no  Any missed doses?: yes     Details: Resumed Coumadin post surgery on 05/14/10.    Is patient compliant with meds? yes       Allergies: 1)  ! Penicillin V Potassium (Penicillin V Potassium) 2)  Lodine  Anticoagulation Management History:      Negative risk factors for bleeding include an age less than 72 years old.  The bleeding index is 'low risk'.  Positive CHADS2 values include History of HTN.  Negative CHADS2 values include Age > 57 years old.  Anticoagulation responsible provider: Kaisen Ackers MD, Fayrene Fearing.  INR POC: 2.2.  Exp: 06/2011.    Anticoagulation Management Assessment/Plan:      The patient's current anticoagulation dose is Coumadin 5 mg tabs: take as directed by Coumadin Clinic.  The target INR is 2.0-3.0.  The next INR is due 06/13/2010.  Anticoagulation instructions were given to patient.  Results were reviewed/authorized by Cloyde Reams, RN, BSN.  She was notified by Cloyde Reams RN.         Prior Anticoagulation Instructions: INR 2.4  Continue taking 1 tablet everyday except 1/2 tablet on Tuesdays, Thursdays, and Saturdays. We will let you know if you are cleared to be off of Coumadin by Dr. Clifton Custard and if she decides to do Lovenox injections while you are off of the Coumadin. If so, your last dose of Coumadin prior to surgery will be 05/06/10. We will call you with further instructions about the Lovenox and how to restart the Coumadin following surgery. Re-check INR  on 05/17/10  Current Anticoagulation Instructions: INR 2.2  Continue on same dosage 1 tablet daily except 1/2 tablet on Tuesdays, Thursdays, and Saturdays.  Recheck in 3 weeks.

## 2010-09-01 NOTE — Letter (Signed)
Summary: Taylor Stanley letter  [facility]  [address] [place], [place] [address]   Phone: [phone]  Fax: [phone]       03/03/2010 MRN: [id number]  [facility] [address] [place], [place]  [address]  Dear Ms. Mellone,  [facility], and [place] announce the retirement of Arta Silence, M.D., from full-time practice at the [facility] office effective January 27, 2010 and his plans of returning part-time.  It is important to Dr. Hetty Ely and to our practice that you understand that [facility] has seven physicians in our office for your health care needs.  We will continue to offer the same exceptional care that you have today.    Dr. Hetty Ely has spoken to many of you about his plans for retirement and returning part-time in the fall.   We will continue to work with you through the transition to schedule appointments for you in the office and meet the high standards that [facility] is committed to.   Again, it is with great pleasure that we share the news that Dr. Hetty Ely will return to [facility] at [facility] in October of 2011 with a reduced schedule.    If you have any questions, or would like to request an appointment with one of our physicians, please call us at [phone] and press the option for Scheduling an appointment.  We take pleasure in providing you with excellent patient care and look forward to seeing you at your next office visit.  Our [facility] Physicians are:  Tillman Abide, M.D. Laurita Quint, M.D. Roxy Manns, M.D. Kerby Nora, M.D. Hannah Beat, M.D. Ruthe Mannan, M.D. We proudly welcomed Raechel Ache, M.D. and Eustaquio Boyden, M.D. to the practice in July/August 2011.  Sincerely,  [facility]

## 2010-09-01 NOTE — Medication Information (Signed)
Summary: rov/mw  Anticoagulant Therapy  Managed by: Weston Brass, PharmD PCP: Clifton Custard MD, Virgilio Belling MD: Jens Som MD, Arlys John Indication 1: DVT Lab Used: [facility] [place] Site: [address] INR POC 2.8 INR RANGE 2.0-3.0  Dietary changes: no    Health status changes: no    Bleeding/hemorrhagic complications: no    Recent/future hospitalizations: no    Any changes in medication regimen? no    Recent/future dental: no  Any missed doses?: no       Is patient compliant with meds? yes       Allergies: 1)  ! Penicillin V Potassium (Penicillin V Potassium) 2)  Lodine  Anticoagulation Management History:      The patient is taking warfarin and comes in today for a routine follow up visit.  Negative risk factors for bleeding include an age less than 37 years old.  The bleeding index is 'low risk'.  Positive CHADS2 values include History of HTN.  Negative CHADS2 values include Age > 36 years old.  Anticoagulation responsible provider: Jens Som MD, Arlys John.  INR POC: 2.8.  Cuvette Lot#: [id number].  Exp: 08/2011.    Anticoagulation Management Assessment/Plan:      The patient's current anticoagulation dose is Coumadin 5 mg tabs: take as directed by [facility]in Clinic.  The target INR is 2.0-3.0.  The next INR is due 08/08/2010.  Anticoagulation instructions were given to patient.  Results were reviewed/authorized by Weston Brass, PharmD.  She was notified by Weston Brass PharmD.         Prior Anticoagulation Instructions: INR 2.5 continue taking a half tablet on tuesday, thursday, and saturday. Recheck in 4 weeks.    Current Anticoagulation Instructions: INR 2.8  Continue same dose of 1 tablet every day except 1/2 tablet on Tuesday, Thursday and Saturday.  Recheck INR in 4 weeks.

## 2010-09-01 NOTE — Assessment & Plan Note (Signed)
Summary: hospital follow up/alc   Vital Signs:  Patient profile:   55 year old female Height:      60.75 inches Weight:      137.13 pounds BMI:     26.22 Temp:     98.1 degrees F oral Pulse rate:   68 / minute Pulse rhythm:   regular BP sitting:   140 / 92  (left arm) Cuff size:   regular  Vitals Entered By: Linde Gillis CMA Duncan Dull) (February 24, 2010 12:11 PM) CC: hospital follow up   History of Present Illness: 55 yo here for ER visit follow up.  Went to ER on 02/20/2010 for worsening right sided neck pain with radiulopathy down arm into finger tips.  Right arm is also becoming much weaker since this started. Went to ER because she was diagnosed wtih a right popliteal DVT weeks before and wanted to make sure she was not having a PE. CT angio neg for PE.  Has h/o L5 surgery for herniated disc in past.  Current Medications (verified): 1)  Clarinex 5 Mg Tabs (Desloratadine) .... Take 1 Tablet By Mouth Once A Day 2)  Benicar Hct 40-12.5 Mg  Tabs (Olmesartan Medoxomil-Hctz) .... Take One By Mouth Once A Day 3)  Nitrostat 0.4 Mg  Subl (Nitroglycerin) .... As Needed 4)  Asmanex 60 Metered Doses 220 Mcg/inh  Aepb (Mometasone Furoate) .Marland Kitchen.. 1 Puff  Once Daily 5)  Nasonex 50 Mcg/act  Susp (Mometasone Furoate) .Marland Kitchen.. 1 Spray in Each Nostril Once Daily 6)  Nexium 40 Mg  Cpdr (Esomeprazole Magnesium) .... Take 1 Tablet By Mouth Once A Day 7)  Lovaza 1 Gm Caps (Omega-3-Acid Ethyl Esters) .... Take 4 Caps Once Daily--May Do in Divided Dose 8)  Zoloft 100 Mg Tabs (Sertraline Hcl) .... Take One Half  By Mouth Daily 9)  Vitamin E 600 Unit  Caps (Vitamin E) .... 200 International Units Daily 10)  Vitamin D 1000 Unit  Tabs (Cholecalciferol) .... Once Daily 11)  Wellbutrin Sr 150 Mg Xr12h-Tab (Bupropion Hcl) .Marland Kitchen.. 1 Tab Po  Twice Daily 12)  Oxycodone-Acetaminophen 5-500 Mg Caps (Oxycodone-Acetaminophen) .Marland Kitchen.. 1 Tab Every 6 Hours As Needed For Pain 13)  Coumadin 5 Mg Tabs (Warfarin Sodium) .... Take As  Directed By Coumadin Clinic  Allergies: 1)  ! Penicillin V Potassium (Penicillin V Potassium) 2)  Lodine  Past History:  Past Medical History: Last updated: 02/21/2007 Colonic polyps, hx of GERD Osteopenia  Past Surgical History: Last updated: 05/27/2009 BTL, S/P C-Section 10/94 Lumbar disc 0990's MRI, DDD, Spondylosis, bulging C4/5, C5/6, C6/7, mild compromise 03/25/01 Carotid U/S, wnl, 03/25/01,  negative RAS via aortic U/S 01/04 Cardiolite wnl, EF 58%, + C. P., negative O/W 01/04,--  08/07 Negative Cholecystectomy, lap 07/07/05 EMG 05/05 MRI neck, T spine, L/S spine- degenerative, changes C + T spine, mild disc bulg L4/5 EGD/Colonoscopy 10/04 24h urine--5HAA, metas - L carpal tunnel surg... colonoscopy--05/14/2009--neg  Family History: Last updated: 09/11/2007 Father: 78--Alzheimers, HBP, hypothyroid, pacemaker, hypothyroid Mother: 77--colon Ca, HBP, obese, boarderline DM, DVT, pacemaker, emphysemia, thyroid disease Siblings: 3 sis--1 DM, obese, HBP, murmur, COPD, hypothyroid                         1--thyroid disease, fibromyalgia                          1--bipolar/suicidal and hypothyroid  1 br--L&W  Social History: Last updated: 09/11/2007 Marital Status: Married Children: 2--14, 39 Occupation: hairdresser  Risk Factors: Caffeine Use: 1 (03/16/2009) Exercise: no (03/16/2009)  Risk Factors: Smoking Status: current (03/16/2009) Packs/Day: 0.5 (03/16/2009) Cigars/wk: 0 (03/16/2009) Pipe Use/wk: 0 (03/16/2009) Cans of tobacco/wk: 0 (03/16/2009) Passive Smoke Exposure: no (03/16/2009)  Review of Systems      See HPI CV:  Denies chest pain or discomfort. MS:  Complains of muscle weakness.  Physical Exam  General:  alert, well-developed, well-nourished, and well-hydrated.   Msk:  Neck:  FROM and strength. Arc sign negative, neg empty can test. mildly decreased grip strength on right. Normal sensation Neurologic:  alert & oriented X3 and  gait normal.     Impression & Recommendations:  Problem # 1:  CERVICAL RADICULOPATHY, RIGHT (ICD-723.4) Assessment New Consistent with cervical pathology. Will refer for immediate MRI, this is not consistent with rotator cuff or impingement syndrome. Percocet as needed pain. Orders: Radiology Referral (Radiology)  Complete Medication List: 1)  Clarinex 5 Mg Tabs (Desloratadine) .... Take 1 tablet by mouth once a day 2)  Benicar Hct 40-12.5 Mg Tabs (Olmesartan medoxomil-hctz) .... Take one by mouth once a day 3)  Nitrostat 0.4 Mg Subl (Nitroglycerin) .... As needed 4)  Asmanex 60 Metered Doses 220 Mcg/inh Aepb (Mometasone furoate) .Marland Kitchen.. 1 puff  once daily 5)  Nasonex 50 Mcg/act Susp (Mometasone furoate) .Marland Kitchen.. 1 spray in each nostril once daily 6)  Nexium 40 Mg Cpdr (Esomeprazole magnesium) .... Take 1 tablet by mouth once a day 7)  Lovaza 1 Gm Caps (Omega-3-acid ethyl esters) .... Take 4 caps once daily--may do in divided dose 8)  Zoloft 100 Mg Tabs (Sertraline hcl) .... Take one half  by mouth daily 9)  Vitamin E 600 Unit Caps (Vitamin e) .... 200 international units daily 10)  Vitamin D 1000 Unit Tabs (Cholecalciferol) .... Once daily 11)  Wellbutrin Sr 150 Mg Xr12h-tab (Bupropion hcl) .Marland Kitchen.. 1 tab po  twice daily 12)  Oxycodone-acetaminophen 5-500 Mg Caps (Oxycodone-acetaminophen) .Marland Kitchen.. 1 tab every 6 hours as needed for pain 13)  Coumadin 5 Mg Tabs (Warfarin sodium) .... Take as directed by coumadin clinic  Patient Instructions: 1)  Please stop by to see Aram Beecham on your way out. Prescriptions: OXYCODONE-ACETAMINOPHEN 5-500 MG CAPS (OXYCODONE-ACETAMINOPHEN) 1 tab every 6 hours as needed for pain  #60 x 0   Entered and Authorized by:   Ruthe Mannan MD   Signed by:   Ruthe Mannan MD on 02/24/2010   Method used:   Print then Give to Patient   RxID:   [id number]   Current Allergies (reviewed today): ! PENICILLIN V POTASSIUM (PENICILLIN V POTASSIUM) LODINE

## 2010-09-01 NOTE — Assessment & Plan Note (Signed)
Summary: CONSULT BEFORE SURGERY/CLE   Vital Signs:  Patient profile:   55 year old female Height:      60.75 inches Weight:      138 pounds BMI:     26.38 Temp:     98.2 degrees F oral Pulse rate:   76 / minute Pulse rhythm:   regular BP sitting:   120 / 80  (left arm) Cuff size:   regular  Vitals Entered By: Linde Gillis CMA Duncan Dull) (May 02, 2010 12:19 PM) CC: consult before surgery   History of Present Illness: 55 yo here for consult before surgery.  Scheduled to have cervical fusion next week by Dr. Aliene Beams. Has been on general anesthesia multiple times in past, never had any issues with sedation or other aspects of anesthesia.  Still smoking but has cut back to 5-8 cig/day.  H/o calf DVT on coumadin.  Will plan on stopping her coumadin 6 days prior to surgery.  Current Medications (verified): 1)  Clarinex 5 Mg Tabs (Desloratadine) .... Take 1 Tablet By Mouth Once A Day 2)  Benicar Hct 40-12.5 Mg  Tabs (Olmesartan Medoxomil-Hctz) .... Take One By Mouth Once A Day 3)  Nitrostat 0.4 Mg  Subl (Nitroglycerin) .... As Needed 4)  Asmanex 60 Metered Doses 220 Mcg/inh  Aepb (Mometasone Furoate) .Marland Kitchen.. 1 Puff  Once Daily 5)  Nasonex 50 Mcg/act  Susp (Mometasone Furoate) .Marland Kitchen.. 1 Spray in Each Nostril Once Daily 6)  Nexium 40 Mg  Cpdr (Esomeprazole Magnesium) .... Take 1 Tablet By Mouth Once A Day 7)  Lovaza 1 Gm Caps (Omega-3-Acid Ethyl Esters) .... Take 4 Caps Once Daily--May Do in Divided Dose 8)  Zoloft 100 Mg Tabs (Sertraline Hcl) .... Take One Half  By Mouth Daily 9)  Vitamin E 600 Unit  Caps (Vitamin E) .... 200 International Units Daily 10)  Vitamin D 1000 Unit  Tabs (Cholecalciferol) .... Once Daily 11)  Wellbutrin Sr 150 Mg Xr12h-Tab (Bupropion Hcl) .Marland Kitchen.. 1 Tab Po  Twice Daily 12)  Coumadin 5 Mg Tabs (Warfarin Sodium) .... Take As Directed By Coumadin Clinic  Allergies: 1)  ! Penicillin V Potassium (Penicillin V Potassium) 2)  Lodine  Past History:  Past  Medical History: Last updated: 02/21/2007 Colonic polyps, hx of GERD Osteopenia  Past Surgical History: Last updated: 05/27/2009 BTL, S/P C-Section 10/94 Lumbar disc 0990's MRI, DDD, Spondylosis, bulging C4/5, C5/6, C6/7, mild compromise 03/25/01 Carotid U/S, wnl, 03/25/01,  negative RAS via aortic U/S 01/04 Cardiolite wnl, EF 58%, + C. P., negative O/W 01/04,--  08/07 Negative Cholecystectomy, lap 07/07/05 EMG 05/05 MRI neck, T spine, L/S spine- degenerative, changes C + T spine, mild disc bulg L4/5 EGD/Colonoscopy 10/04 24h urine--5HAA, metas - L carpal tunnel surg... colonoscopy--05/14/2009--neg  Family History: Last updated: 09/11/2007 Father: 78--Alzheimers, HBP, hypothyroid, pacemaker, hypothyroid Mother: 77--colon Ca, HBP, obese, boarderline DM, DVT, pacemaker, emphysemia, thyroid disease Siblings: 3 sis--1 DM, obese, HBP, murmur, COPD, hypothyroid                         1--thyroid disease, fibromyalgia                          1--bipolar/suicidal and hypothyroid               1 br--L&W  Social History: Last updated: 09/11/2007 Marital Status: Married Children: 2--14, 29 Occupation: hairdresser  Risk Factors: Caffeine Use: 1 (03/16/2009) Exercise: no (03/16/2009)  Risk Factors: Smoking Status: current (03/16/2009) Packs/Day: 0.5 (03/16/2009) Cigars/wk: 0 (03/16/2009) Pipe Use/wk: 0 (03/16/2009) Cans of tobacco/wk: 0 (03/16/2009) Passive Smoke Exposure: no (03/16/2009)  Review of Systems      See HPI General:  Denies malaise. Eyes:  Denies blurring. CV:  Denies chest pain or discomfort, palpitations, and shortness of breath with exertion. Resp:  Denies shortness of breath. GI:  Denies abdominal pain and change in bowel habits.  Physical Exam  General:  alert, well-developed, well-nourished, and well-hydrated.   Mouth:  Oral mucosa and oropharynx without lesions or exudates.  Teeth in good repair. Lungs:  normal respiratory effort, no intercostal  retractions, no accessory muscle use, and normal breath sounds.   Heart:  normal rate, regular rhythm, and no murmur.   Extremities:  no edema Psych:  normally interactive, flat affect, and subdued.     Impression & Recommendations:  Problem # 1:  UNSPECIFIED PRE-OPERATIVE EXAMINATION (ICD-V72.84) EKG- NSR, no acute changes from prior. Cleared medically for surgery from my standpoint.  I did strongly encourage her to quit smoking prior to surgery. Orders: EKG w/ Interpretation (93000)  Complete Medication List: 1)  Clarinex 5 Mg Tabs (Desloratadine) .... Take 1 tablet by mouth once a day 2)  Benicar Hct 40-12.5 Mg Tabs (Olmesartan medoxomil-hctz) .... Take one by mouth once a day 3)  Nitrostat 0.4 Mg Subl (Nitroglycerin) .... As needed 4)  Asmanex 60 Metered Doses 220 Mcg/inh Aepb (Mometasone furoate) .Marland Kitchen.. 1 puff  once daily 5)  Nasonex 50 Mcg/act Susp (Mometasone furoate) .Marland Kitchen.. 1 spray in each nostril once daily 6)  Nexium 40 Mg Cpdr (Esomeprazole magnesium) .... Take 1 tablet by mouth once a day 7)  Lovaza 1 Gm Caps (Omega-3-acid ethyl esters) .... Take 4 caps once daily--may do in divided dose 8)  Zoloft 100 Mg Tabs (Sertraline hcl) .... Take one half  by mouth daily 9)  Vitamin E 600 Unit Caps (Vitamin e) .... 200 international units daily 10)  Vitamin D 1000 Unit Tabs (Cholecalciferol) .... Once daily 11)  Wellbutrin Sr 150 Mg Xr12h-tab (Bupropion hcl) .Marland Kitchen.. 1 tab po  twice daily 12)  Coumadin 5 Mg Tabs (Warfarin sodium) .... Take as directed by coumadin clinic  Other Orders: Flu Vaccine 37yrs + (52841) Admin 1st Vaccine (32440)  Current Allergies (reviewed today): ! PENICILLIN V POTASSIUM (PENICILLIN V POTASSIUM) LODINE Last HDL:  39.70 (09/06/2009 10:32:24 AM) HDL Next Due:  6 mo Last LDL:  119 (09/06/2009 10:32:24 AM) LDL Next Due:  6 mo   Immunizations Administered:  Influenza Vaccine # 1:    Vaccine Type: Fluvax 3+    Site: right deltoid    Mfr: GlaxoSmithKline     Dose: 0.5 ml    Route: IM    Given by: Linde Gillis CMA (AAMA)    Exp. Date: 01/28/2011    Lot #: [id number]    VIS given: 02/22/10 version given May 02, 2010.

## 2010-09-05 ENCOUNTER — Encounter (INDEPENDENT_AMBULATORY_CARE_PROVIDER_SITE_OTHER): Payer: BC Managed Care – PPO

## 2010-09-05 ENCOUNTER — Encounter: Payer: Self-pay | Admitting: Cardiology

## 2010-09-05 DIAGNOSIS — I80299 Phlebitis and thrombophlebitis of other deep vessels of unspecified lower extremity: Secondary | ICD-10-CM

## 2010-09-05 DIAGNOSIS — Z7901 Long term (current) use of anticoagulants: Secondary | ICD-10-CM

## 2010-09-05 LAB — CONVERTED CEMR LAB: POC INR: 1.9

## 2010-09-13 DIAGNOSIS — I82409 Acute embolism and thrombosis of unspecified deep veins of unspecified lower extremity: Secondary | ICD-10-CM

## 2010-09-15 NOTE — Medication Information (Signed)
Summary: Coumadin Clinic  Anticoagulant Therapy  Managed by: Windell Hummingbird, RN PCP: Clifton Custard MD, Virgilio Belling MD: Myrtis Ser MD, Tinnie Gens Indication 1: DVT Lab Used: [facility] [place] Site: [address] INR POC 1.9 INR RANGE 2.0-3.0  Dietary changes: yes       Details: Has been eating a lot of lettuce  Health status changes: no    Bleeding/hemorrhagic complications: no    Recent/future hospitalizations: no    Any changes in medication regimen? no    Recent/future dental: no  Any missed doses?: no       Is patient compliant with meds? yes       Allergies: 1)  ! Penicillin V Potassium (Penicillin V Potassium) 2)  Lodine  Anticoagulation Management History:      The patient is taking warfarin and comes in today for a routine follow up visit.  Negative risk factors for bleeding include an age less than 67 years old.  The bleeding index is 'low risk'.  Positive CHADS2 values include History of HTN.  Negative CHADS2 values include Age > 35 years old.  Her last INR was 2.7.  Anticoagulation responsible provider: Myrtis Ser MD, Tinnie Gens.  INR POC: 1.9.  Cuvette Lot#: [id number].  Exp: 08/2011.    Anticoagulation Management Assessment/Plan:      The patient's current anticoagulation dose is Coumadin 5 mg tabs: take as directed by Coumadin Clinic.  The target INR is 2.0-3.0.  The next INR is due 10/03/2010.  Anticoagulation instructions were given to patient.  Results were reviewed/authorized by Windell Hummingbird, RN.  She was notified by Windell Hummingbird, RN.         Prior Anticoagulation Instructions: INR 2.7 (goal 2-3) Continue taking same dosing schedule below. Next appointment:  Monday, Feb. 6th at 9:00AM.  Current Anticoagulation Instructions: INR 1.9 Today take 1.5 tablets, then resume taking 1 tablet every day except take .5 tablet on Tuesdays, Thursdays, and Saturdays. Recheck in 4 weeks.

## 2010-09-29 ENCOUNTER — Telehealth: Payer: Self-pay | Admitting: Family Medicine

## 2010-10-03 ENCOUNTER — Encounter: Payer: Self-pay | Admitting: Cardiology

## 2010-10-03 ENCOUNTER — Encounter (INDEPENDENT_AMBULATORY_CARE_PROVIDER_SITE_OTHER): Payer: BC Managed Care – PPO

## 2010-10-03 DIAGNOSIS — I8010 Phlebitis and thrombophlebitis of unspecified femoral vein: Secondary | ICD-10-CM

## 2010-10-03 DIAGNOSIS — Z7901 Long term (current) use of anticoagulants: Secondary | ICD-10-CM

## 2010-10-06 NOTE — Progress Notes (Signed)
Summary: regarding coumadin  Phone Note Call from Patient Call back at Home Phone [phone]   Caller: Patient Call For: Ruthe Mannan MD Summary of Call: Pt is asking when she should have her lipids checked again, she says Billie usually checked it every 6 months.  Also, she is on coumadin for a clot she had last June.  She is asking when she can come off coumadin, she has this checked at the clinic.                Lowella Petties CMA, AAMA  September 29, 2010 4:00 PM   Follow-up for Phone Call        Let's have her come in for a complete physical as it looks like she is do.  WE can recheck lipids prior to her visit and discuss her coumadin. Ruthe Mannan MD  September 29, 2010 4:28 PM  Patient advised as instructed via telephone, CPX scheduled for 10/10/10 at 8:30.    Follow-up by: Linde Gillis CMA Duncan Dull),  September 29, 2010 4:38 PM

## 2010-10-10 ENCOUNTER — Encounter: Payer: Self-pay | Admitting: Family Medicine

## 2010-10-10 ENCOUNTER — Encounter (INDEPENDENT_AMBULATORY_CARE_PROVIDER_SITE_OTHER): Payer: BC Managed Care – PPO | Admitting: Family Medicine

## 2010-10-10 ENCOUNTER — Other Ambulatory Visit: Payer: Self-pay | Admitting: Family Medicine

## 2010-10-10 DIAGNOSIS — R5383 Other fatigue: Secondary | ICD-10-CM | POA: Insufficient documentation

## 2010-10-10 DIAGNOSIS — Z0000 Encounter for general adult medical examination without abnormal findings: Secondary | ICD-10-CM

## 2010-10-10 DIAGNOSIS — I10 Essential (primary) hypertension: Secondary | ICD-10-CM

## 2010-10-10 DIAGNOSIS — I82409 Acute embolism and thrombosis of unspecified deep veins of unspecified lower extremity: Secondary | ICD-10-CM | POA: Insufficient documentation

## 2010-10-10 DIAGNOSIS — E039 Hypothyroidism, unspecified: Secondary | ICD-10-CM

## 2010-10-10 DIAGNOSIS — R5381 Other malaise: Secondary | ICD-10-CM | POA: Insufficient documentation

## 2010-10-10 DIAGNOSIS — E782 Mixed hyperlipidemia: Secondary | ICD-10-CM

## 2010-10-10 LAB — BASIC METABOLIC PANEL
CO2: 28 mEq/L (ref 19–32)
Calcium: 8.9 mg/dL (ref 8.4–10.5)
GFR: 118.56 mL/min (ref >60.00)
Potassium: 4 mEq/L (ref 3.5–5.1)
Sodium: 139 mEq/L (ref 135–145)

## 2010-10-10 LAB — HEPATIC FUNCTION PANEL
ALT: 29 U/L (ref 0–35)
Bilirubin, Direct: 0 mg/dL (ref 0.0–0.3)
Total Bilirubin: 0.3 mg/dL (ref 0.3–1.2)

## 2010-10-10 LAB — LIPID PANEL
HDL: 34.6 mg/dL — ABNORMAL LOW (ref >39.00)
Total CHOL/HDL Ratio: 5

## 2010-10-11 LAB — CONVERTED CEMR LAB: Vit D, 25-Hydroxy: 28 ng/mL — ABNORMAL LOW (ref 30–89)

## 2010-10-11 NOTE — Medication Information (Signed)
Summary: rov/tm  Anticoagulant Therapy  Managed by: Bayard Hugger, PharmD PCP: Clifton Custard MD, Virgilio Belling MD: Daleen Squibb MD, Maisie Fus Indication 1: DVT Lab Used: [facility] [place] Site: [address] INR POC 2.2 INR RANGE 2.0-3.0  Dietary changes: no    Health status changes: no    Bleeding/hemorrhagic complications: no    Recent/future hospitalizations: no    Any changes in medication regimen? no    Recent/future dental: no  Any missed doses?: yes     Details: Missed Saturday's dose (1/2 tablet)  Is patient compliant with meds? yes      Comments: Pt will see PCP next week to determine length of coumadin therapy. No rov scheduled for coumadin clinic today. If pt continue on coumadin, she will call to make an appointment  Allergies: 1)  ! Penicillin V Potassium (Penicillin V Potassium) 2)  Lodine  Anticoagulation Management History:      Negative risk factors for bleeding include an age less than 35 years old.  The bleeding index is 'low risk'.  Positive CHADS2 values include History of HTN.  Negative CHADS2 values include Age > 80 years old.  Her last INR was 2.7.  Anticoagulation responsible provider: Daleen Squibb MD, Maisie Fus.  INR POC: 2.2.  Cuvette Lot#: [id number].  Exp: 08/2011.    Anticoagulation Management Assessment/Plan:      The patient's current anticoagulation dose is Coumadin 5 mg tabs: take as directed by Coumadin Clinic.  The target INR is 2.0-3.0.  The next INR is due 10/03/2010.  Anticoagulation instructions were given to patient.  Results were reviewed/authorized by Bayard Hugger, PharmD.         Prior Anticoagulation Instructions: INR 1.9 Today take 1.5 tablets, then resume taking 1 tablet every day except take .5 tablet on Tuesdays, Thursdays, and Saturdays. Recheck in 4 weeks.   Current Anticoagulation Instructions: INR 2.2 continue current dose: 1 tablet Sun, Mon, Wed, Fri 1/2 tablet Tue, Tue, Sat

## 2010-10-15 LAB — POCT I-STAT, CHEM 8
Chloride: 104 mEq/L (ref 96–112)
HCT: 37 % (ref 36.0–46.0)
Potassium: 3.4 mEq/L — ABNORMAL LOW (ref 3.5–5.1)

## 2010-10-15 LAB — PROTIME-INR
INR: 3.35 — ABNORMAL HIGH (ref 0.00–1.49)
Prothrombin Time: 33.7 seconds — ABNORMAL HIGH (ref 11.6–15.2)

## 2010-10-18 NOTE — Assessment & Plan Note (Signed)
Summary: CPX, discuss coumadin/nt   Vital Signs:  Patient profile:   55 year old female Height:      60.75 inches Weight:      143.50 pounds BMI:     27.44 Temp:     98.0 degrees F oral Pulse rate:   61 / minute Pulse rhythm:   regular BP sitting:   130 / 70  (left arm) Cuff size:   regular  Vitals Entered By: Linde Gillis CMA Duncan Dull) (October 10, 2010 8:19 AM) CC: complete physicial no pap, discuss coumadin   History of Present Illness: 55 yo here for CPX.  Had her cervical fusion surgery,  Dr. Aliene Beams. Doing well.  Quit smoking the day of her surgery and feels so much better. A little tired, otherwise doing well.    H/o right popliteal DVT in 01/2010.  Has been on coumadin since. No h/o bleeding. Pt has no h/o cancer. No h/o previous DVTs.  Current Medications (verified): 1)  Clarinex 5 Mg Tabs (Desloratadine) .... Take 1 Tablet By Mouth Once A Day 2)  Benicar Hct 40-12.5 Mg  Tabs (Olmesartan Medoxomil-Hctz) .... Take One By Mouth Once A Day 3)  Nitrostat 0.4 Mg  Subl (Nitroglycerin) .... As Needed 4)  Asmanex 60 Metered Doses 220 Mcg/inh  Aepb (Mometasone Furoate) .Marland Kitchen.. 1 Puff  Once Daily 5)  Nasonex 50 Mcg/act  Susp (Mometasone Furoate) .Marland Kitchen.. 1 Spray in Each Nostril Once Daily 6)  Nexium 40 Mg  Cpdr (Esomeprazole Magnesium) .... Take 1 Tablet By Mouth Once A Day 7)  Lovaza 1 Gm Caps (Omega-3-Acid Ethyl Esters) .... Take 4 Caps Once Daily--May Do in Divided Dose 8)  Zoloft 100 Mg Tabs (Sertraline Hcl) .... Take One Half  By Mouth Daily  Allergies: 1)  ! Penicillin V Potassium (Penicillin V Potassium) 2)  Lodine  Past History:  Past Medical History: Last updated: 02/21/2007 Colonic polyps, hx of GERD Osteopenia  Past Surgical History: Last updated: 05/27/2009 BTL, S/P C-Section 10/94 Lumbar disc 0990's MRI, DDD, Spondylosis, bulging C4/5, C5/6, C6/7, mild compromise 03/25/01 Carotid U/S, wnl, 03/25/01,  negative RAS via aortic U/S 01/04 Cardiolite wnl, EF  58%, + C. P., negative O/W 01/04,--  08/07 Negative Cholecystectomy, lap 07/07/05 EMG 05/05 MRI neck, T spine, L/S spine- degenerative, changes C + T spine, mild disc bulg L4/5 EGD/Colonoscopy 10/04 24h urine--5HAA, metas - L carpal tunnel surg... colonoscopy--05/14/2009--neg  Family History: Last updated: 09/11/2007 Father: 78--Alzheimers, HBP, hypothyroid, pacemaker, hypothyroid Mother: 77--colon Ca, HBP, obese, boarderline DM, DVT, pacemaker, emphysemia, thyroid disease Siblings: 3 sis--1 DM, obese, HBP, murmur, COPD, hypothyroid                         1--thyroid disease, fibromyalgia                          1--bipolar/suicidal and hypothyroid               1 br--L&W  Social History: Last updated: 09/11/2007 Marital Status: Married Children: 2--14, 29 Occupation: hairdresser  Risk Factors: Caffeine Use: 1 (03/16/2009) Exercise: no (03/16/2009)  Risk Factors: Smoking Status: current (03/16/2009) Packs/Day: 0.5 (03/16/2009) Cigars/wk: 0 (03/16/2009) Pipe Use/wk: 0 (03/16/2009) Cans of tobacco/wk: 0 (03/16/2009) Passive Smoke Exposure: no (03/16/2009)  Review of Systems      See HPI General:  Complains of fatigue; denies malaise. Eyes:  Denies blurring. ENT:  Denies difficulty swallowing. CV:  Denies chest pain or  discomfort. Resp:  Denies shortness of breath. GI:  Denies abdominal pain, bloody stools, and change in bowel habits. GU:  Denies dysuria. MS:  Denies joint pain, joint redness, and joint swelling. Derm:  Denies rash. Neuro:  Denies headaches. Psych:  Denies anxiety and depression. Endo:  Denies cold intolerance and heat intolerance. Heme:  Denies abnormal bruising and bleeding.  Physical Exam  General:  alert, well-developed, well-nourished, and well-hydrated.   Head:  normocephalic and atraumatic.  normocephalic and atraumatic.   Eyes:  vision grossly intact, pupils equal, pupils round, and pupils reactive to light.  vision grossly intact, pupils  equal, pupils round, and pupils reactive to light.   Ears:  R ear normal and L ear normal.  R ear normal and L ear normal.   Nose:  nose piercing noted.  nose piercing noted.   Mouth:  good dentition.  good dentition.   Neck:  well healed surgical scar Lungs:  normal respiratory effort, no intercostal retractions, no accessory muscle use, and normal breath sounds.   Heart:  normal rate, regular rhythm, and no murmur.   Abdomen:  Bowel sounds positive,abdomen soft and non-tender without masses, organomegaly or hernias noted. Msk:  No deformity or scoliosis noted of thoracic or lumbar spine.   Extremities:  no edema Neurologic:  alert & oriented X3 and gait normal.  alert & oriented X3 and gait normal.   Skin:  Intact without suspicious lesions or rashes Psych:  Cognition and judgment appear intact. Alert and cooperative with normal attention span and concentration. No apparent delusions, illusions, hallucinations   Impression & Recommendations:  Problem # 1:  Preventive Health Care (ICD-V70.0) Reviewed preventive care protocols, scheduled due services, and updated immunizations Discussed nutrition, exercise, diet, and healthy lifestyle.  Pap smear and mammogram scheduled. Stool cards, FLP, BMET today.  Problem # 2:  DEEP VENOUS THROMBOPHLEBITIS, LEG, RIGHT (ICD-453.40) Assessment: New Low risk, will d/c coumadin. Start ASA 81 mg daily.  Complete Medication List: 1)  Clarinex 5 Mg Tabs (Desloratadine) .... Take 1 tablet by mouth once a day 2)  Benicar Hct 40-12.5 Mg Tabs (Olmesartan medoxomil-hctz) .... Take one by mouth once a day 3)  Nitrostat 0.4 Mg Subl (Nitroglycerin) .... As needed 4)  Asmanex 60 Metered Doses 220 Mcg/inh Aepb (Mometasone furoate) .Marland Kitchen.. 1 puff  once daily 5)  Nasonex 50 Mcg/act Susp (Mometasone furoate) .Marland Kitchen.. 1 spray in each nostril once daily 6)  Nexium 40 Mg Cpdr (Esomeprazole magnesium) .... Take 1 tablet by mouth once a day 7)  Lovaza 1 Gm Caps (Omega-3-acid  ethyl esters) .... Take 4 caps once daily--may do in divided dose 8)  Zoloft 100 Mg Tabs (Sertraline hcl) .... Take one half  by mouth daily  Other Orders: Venipuncture (95284) TLB-Lipid Panel (80061-LIPID) TLB-BMP (Basic Metabolic Panel-BMET) (80048-METABOL) TLB-TSH (Thyroid Stimulating Hormone) (84443-TSH) Specimen Handling (13244) T-Vitamin D (25-Hydroxy) (01027-25366) TLB-Hepatic/Liver Function Pnl (80076-HEPATIC)  Patient Instructions: 1)  Great to see you. 2)  Please STOP taking your Coumadin. 3)  Start taking an aspirin 81 mg daily.  4)  You can restart your Vitamin E and Vitamin D. Prescriptions: BENICAR HCT 40-12.5 MG  TABS (OLMESARTAN MEDOXOMIL-HCTZ) Take one by mouth once a day  #90 x 3   Entered and Authorized by:   Ruthe Mannan MD   Signed by:   Ruthe Mannan MD on 10/10/2010   Method used:   Electronically to        [facility] [address] [phone]* (retail)  [address]       [place], [place]  [address]       Ph: [phone] or [phone]       Fax: [phone]   RxID:   [id number]    Orders Added: 1)  Venipuncture [64403] 2)  TLB-Lipid Panel [80061-LIPID] 3)  TLB-BMP (Basic Metabolic Panel-BMET) [80048-METABOL] 4)  TLB-TSH (Thyroid Stimulating Hormone) [84443-TSH] 5)  Specimen Handling [99000] 6)  T-Vitamin D (25-Hydroxy) [47425-95638] 7)  TLB-Hepatic/Liver Function Pnl [80076-HEPATIC] 8)  Est. Patient 40-64 years [75643]    Current Allergies (reviewed today): ! PENICILLIN V POTASSIUM (PENICILLIN V POTASSIUM) LODINE

## 2010-10-18 NOTE — Letter (Signed)
Summary: [facility]: Immunoassay Fecal Occult Blood (iFOB) Order Form  [place] [facility]  [address] [place], [place] [address]   Phone: [phone]  Fax: [phone]      [facility]: Immunoassay Fecal Occult Blood (iFOB) Order Form   October 10, 2010 MRN: [id number]   NORENE Spieth 08-22-1955   Physicican Name:__________[name] Aron_______________  Diagnosis Code:__________792.1________________      Ruthe Mannan MD

## 2010-10-25 ENCOUNTER — Other Ambulatory Visit (INDEPENDENT_AMBULATORY_CARE_PROVIDER_SITE_OTHER): Payer: BC Managed Care – PPO | Admitting: Family Medicine

## 2010-10-25 ENCOUNTER — Other Ambulatory Visit: Payer: BC Managed Care – PPO

## 2010-10-25 DIAGNOSIS — Z1211 Encounter for screening for malignant neoplasm of colon: Secondary | ICD-10-CM

## 2010-10-26 ENCOUNTER — Encounter: Payer: Self-pay | Admitting: *Deleted

## 2010-11-30 ENCOUNTER — Ambulatory Visit: Payer: Self-pay | Admitting: Cardiovascular Disease

## 2010-12-16 NOTE — H&P (Signed)
NAME:  ADDIS, Taylor Stanley   MEDICAL RECORD NO.:  [id number]          PATIENT TYPE:  INP   LOCATION:  1830                         FACILITY:  [facility]   PHYSICIAN:  Angelia Mould. Derrell Lolling, M.D.DATE OF BIRTH:  1955-11-28   DATE OF ADMISSION:  07/07/2005  DATE OF DISCHARGE:                                HISTORY & PHYSICAL   HISTORY OF PRESENT ILLNESS:  Ms. Ebrahim is a 55 year old female who  complains of a several-year-history of right upper quadrant pain.  Several  years ago she was told that she had a cloudy gallbladder.  She has now had  increased pain and was seen at the Urgent Center yesterday and had an  ultrasound.  The ultrasound showed a 1 cm stone in the gallbladder neck.  She is now being admitted for a cholecystectomy.   REVIEW OF SYSTEMS:  As above.  She is nauseated.  She has had shortness of  breath, otherwise negative.   ALLERGIES:  none listed   MEDICATIONS:  1.  Zoloft.  2.  Clarinex.  3.  Nexium.  4.  Pulmicort.  5.  Benicar.   FAMILY HISTORY:  non contributory.   SOCIAL HISTORY:  She is a Social worker.  She is married with two kids.  Smokes cigarettes.  No alcohol use.   PAST MEDICAL/SURGICAL HISTORY:  1.  Anxiety.  2.  Reflux.  3.  Hypertension.  4.  History of back surgery.   LABORATORY DATA:  Electrocardiogram and chest x-ray are pending.   PHYSICAL EXAMINATION:  VITAL SIGNS:  Blood pressure 19/87, pulse 60,  respirations 20.  HEENT:  Grossly normal.  Sclerae clear.  Conjunctivae normal.  Nares without  drainage.  NECK:  No carotid or supraclavicular bruits.  No jugular venous distention  or thyromegaly.  CHEST:  Clear to auscultation bilaterally.  No rales or rhonchi.  HEART:  A regular rate and rhythm.  No gross murmur.  ABDOMEN:  Positive for right upper quadrant tenderness.  Good bowel sounds.  EXTREMITIES:  No peripheral edema.  NEUROLOGIC:  Grossly intact.  SKIN:  Warm and dry.   ASSESSMENT:  1.  Symptomatic  cholelithiasis.  2.  Hypertension.  3.  Anxiety.  4.  Smoker.   PLAN:  This patient will be kept n.p.o.  Start her on IV fluids.  Place her  on IV antibiotics.  Plans for the gallbladder to be removed today by Dr.  Angelia Mould. Lorelle Formosa, P.A.      Angelia Mould. Derrell Lolling, M.D.  Electronically Signed    LB/MEDQ  D:  07/07/2005  T:  07/07/2005  Job:  [id number]

## 2010-12-16 NOTE — Discharge Summary (Signed)
NAME:  Taylor Stanley, SPONSEL NO.:  [id number]   MEDICAL RECORD NO.:  [id number]          PATIENT TYPE:  INP   LOCATION:  5735                         FACILITY:  [facility]   PHYSICIAN:  Angelia Mould. Derrell Lolling, M.D.DATE OF BIRTH:  14-Aug-1955   DATE OF ADMISSION:  07/07/2005  DATE OF DISCHARGE:  07/08/2005                                 DISCHARGE SUMMARY   DISCHARGE DIAGNOSES:  1.  Symptomatic cholelithiasis/cholecystectomy.  2.  Hypertension.  3.  Anxiety.  4.  Gastroesophageal reflux disease.  5.  History of microscopic tubal ligation.   HOSPITAL COURSE:  [name] is a 55 year old female who has a long history  of intermittent right upper quadrant pain.  She has recently had increase in  pain, and, her ultrasound in the emergency room shows 1 cm stone in the  gallbladder neck.  She was then taken to the operating for laparoscopic  cholecystectomy.  This was performed by Dr. Claud Kelp without  difficulty.  She remained in the hospital overnight and the next day she was  felt to be ready for discharge to home.   MEDICATIONS:  She was discharged to home on her current medications  including Zoloft, Clarinex, Nexium, Pulmicort, and Benicar.   FOLLOW UP:  She was instructed to return to the office in three weeks to see  Dr. Derrell Lolling.  She was given Vicodin for pain.  She was given a home care  instruction sheet regarding laparoscopic cholecystectomy.   CONDITION ON DISCHARGE:  The patient was discharged home in stable  condition.      Guy Franco, P.A.      Angelia Mould. Derrell Lolling, M.D.  Electronically Signed    LB/MEDQ  D:  09/11/2005  T:  09/11/2005  Job:  [id number]   cc:   Angelia Mould. Derrell Lolling, M.D.  [address], [address]  [place]  [place] [address]

## 2010-12-16 NOTE — Op Note (Signed)
   NAME:  Taylor Stanley, Taylor Stanley                        ACCOUNT NO.:  [id number]   MEDICAL RECORD NO.:  [id number]                   PATIENT TYPE:  AMB   LOCATION:  ENDO                                 FACILITY:  [facility]   PHYSICIAN:  John C. Madilyn Fireman, M.D.                 DATE OF BIRTH:  11-13-1955   DATE OF PROCEDURE:  05/15/2003  DATE OF DISCHARGE:                                 OPERATIVE REPORT   PROCEDURE:  Esophagogastroduodenoscopy with esophageal dilatation.   INDICATIONS FOR PROCEDURE:  Chronic reflux symptoms and intermittent solid  food dysphagia.   DESCRIPTION OF PROCEDURE:  The patient was placed in the left lateral  decubitus position then placed on the pulse monitor with continuous low flow  oxygen delivered by nasal cannula. She was sedated with 62.5 mcg IV fentanyl  and 7 mg IV Versed. The Olympus video endoscope was advanced under direct  vision into the oropharynx and esophagus. The esophagus was straight and of  normal caliber with the squamocolumnar line at 38 cm. There was no visible  hiatal hernia, ring, stricture or other abnormality of the GE junction. The  stomach was entered and a small amount of liquid secretions were suctioned  from the fundus. Retroflexed view of the cardia was unremarkable. The  fundus, body, antrum and pylorus all appeared normal. The duodenum was  entered and both the bulb and second portion are well inspected and appear  to be within normal limits. The Savary guidewire was placed through the  endoscope channel and the scope withdrawn. A single 17 mm Savary dilator was  passed over the guidewire with mild resistance and no blood seen on  withdrawal. The dilator was removed together with the wire and the patient  returned to the recovery room in stable condition.  She tolerated the  procedure well and there were no immediate complications.   IMPRESSION:  Basically normal endoscopy status post empiric esophageal  dilatation due to dysphagia.   PLAN:  Advance diet and observe response to dilatation.                                               John C. Madilyn Fireman, M.D.    JCH/MEDQ  D:  05/15/2003  T:  05/15/2003  Job:  [id number]   cc:   Guy Sandifer. Arleta Creek, M.D.  [address]  [place]  [place] [address]  Fax: [phone]

## 2010-12-16 NOTE — Discharge Summary (Signed)
NAME:  Taylor Stanley, Taylor Stanley NO.:  [id number]   MEDICAL RECORD NO.:  [id number]          PATIENT TYPE:  INP   LOCATION:  3707                         FACILITY:  [facility]   PHYSICIAN:  Willow Ora, MD           DATE OF BIRTH:  [birth year]   DATE OF ADMISSION:  03/03/2006  DATE OF DISCHARGE:  03/04/2006                                 DISCHARGE SUMMARY   ADMITTING DIAGNOSES:  Chest pain.   DISCHARGE DIAGNOSES:  Chest pain with negative enzymes and normal EKG.  The  patient to be discharged for outpatient Cardiolite.   HOSPITAL COURSE:  The patient was admitted to [facility] with  atypical chest pain.  Her hospital stay was uneventful.  EKGs were stable.  All cardiac enzymes were negative.  She will be discharged with the  following instructions;  1. Resume all home medications including Ativan, Benicar, Clarinex.  2. Start aspirin 325 mg one p.o. daily.  3. Start nitroglycerin sublingual p.r.n. chest pain.  4. Go to the emergency room if the chest pain is severe or prolonged.  5. See primary care physician within 10 days.  6. Will arrange for outpatient Cardiolite with the cardiology service.      Willow Ora, MD  Electronically Signed     JP/MEDQ  D:  03/04/2006  T:  03/04/2006  Job:  [id number]

## 2010-12-16 NOTE — Op Note (Signed)
NAME:  Taylor Stanley, Taylor Stanley NO.:  [id number]   MEDICAL RECORD NO.:  [id number]          PATIENT TYPE:  INP   LOCATION:  5735                         FACILITY:  [facility]   PHYSICIAN:  Angelia Mould. Derrell Lolling, M.D.DATE OF BIRTH:  August 15, 1955   DATE OF PROCEDURE:  07/07/2005  DATE OF DISCHARGE:  07/08/2005                                 OPERATIVE REPORT   PREOPERATIVE DIAGNOSIS:  Symptomatic gallstones.   POSTOPERATIVE DIAGNOSIS:  Symptomatic gallstones.   OPERATION PERFORMED:  Laparoscopic cholecystectomy with intraoperative  cholangiogram.   SURGEON:  Angelia Mould. Derrell Lolling, M.D.   FIRST ASSISTANT:  Gita Kudo, M.D.   OPERATIVE INDICATIONS:  This is a 55 year old white female who has had  repeated bouts of epigastric and right upper quadrant pain over the past few  months.  Last night she had a severe episode, although she did not vomit.  She had to come to the emergency room.  She she returned this morning and  had an ultrasound, which showed gallstones.  She did not have any wall  thickening and did not have any dilatation of her bile ducts.  Her lipase,  white blood cell count liver function tests were normal.  When evaluated  this afternoon, she was still tender in the right upper quadrant.  We  offered cholecystectomy at this time and she said that she wanted to go  ahead and have that done.  She is operated upon semi-urgently.   OPERATIVE FINDINGS:  The gallbladder was a little bit edematous but not  severely inflamed.  It contained a large gallstone.  The liver, stomach,  duodenum, small intestine, large intestine and peritoneal surfaces were  normal.  There were some omental adhesions the lower abdomen from her  previous laparoscopic tubal ligation.  The cholangiogram was normal, showing  normal intrahepatic and extrahepatic biliary anatomy, no filling defects,  and good flow of contrast into the duodenum.   OPERATIVE TECHNIQUE:  Following induction of general  endotracheal  anesthesia, the patient's abdomen was prepped and draped in a sterile  fashion.  Marcaine 0.5% with epinephrine was used as a local infiltration  anesthetic.  A vertically-oriented incision was made at the lower rim of the  umbilicus.  The fascia was incised in the midline and the abdominal cavity  entered under direct vision.  A 10 mm Hasson trocar was inserted and secured  with a pursestring suture of 0 Vicryl.  Pneumoperitoneum was created.  The  video camera was inserted with visualization and findings as described  above.  A 10 mm trocar was placed in the subxiphoid region and two 5 mm  trocars placed in the right midabdomen.  The gallbladder fundus was  identified and elevated.  There were moderate adhesions to the lower body  and infundibulum of the gallbladder, and these were dissected away  carefully.  We dissected out the cystic duct and the cystic artery.  I  isolated the cystic artery as it went onto the wall of the gallbladder,  secured it with multiple metal clips and divided it.  This created a nice  window behind the  gallbladder and allowed a critical view of the anatomy.  A  cholangiogram catheter was inserted into the cystic duct and a cholangiogram  obtained using the C-arm.  This showed normal anatomy, no filling defect and  no obstruction.  The cholangiogram catheter was removed.  The cystic duct  was secured with multiple metal clips and divided.  The gallbladder was  dissected from its bed with electrocautery and was placed in a specimen bag  and removed.  The operative field was copiously irrigated with saline.  Hemostasis was excellent.  At the completion of the case there was no  bleeding and no bile leak whatsoever.   The trocars were removed under direct vision and there was no bleeding from  the trocar sites.  Pneumoperitoneum was released.  The fascia at the  umbilicus was closed with 0 Vicryl sutures.  The skin incision closed with   subcuticular suture of 4-0 Monocryl and Steri-Strips.  Clean bandages were  placed.  The patient was taken to the recovery room in stable condition.  Estimated blood loss was 10 mL.  Complications:  None.  Sponge, needle and  instrument counts were correct.      Angelia Mould. Derrell Lolling, M.D.  Electronically Signed     HMI/MEDQ  D:  07/07/2005  T:  07/08/2005  Job:  [id number]   cc:   Corinda Gubler at [facility]

## 2010-12-16 NOTE — Op Note (Signed)
   NAME:  Taylor Stanley, Taylor Stanley                        ACCOUNT NO.:  [id number]   MEDICAL RECORD NO.:  [id number]                   PATIENT TYPE:  AMB   LOCATION:  ENDO                                 FACILITY[facility]  [facility]   PHYSICIAN:  John C. Madilyn Fireman, M.D.                 DATE OF BIRTH:  09-02-1955   DATE OF PROCEDURE:  05/15/2003  DATE OF DISCHARGE:                                 OPERATIVE REPORT   PROCEDURE:  Colonoscopy.   INDICATIONS FOR PROCEDURE:  Family history of colon cancer in a first degree  relative.   DESCRIPTION OF PROCEDURE:  The patient was placed in the left lateral  decubitus position then placed on the pulse monitor with continuous low flow  oxygen delivered by nasal cannula. She was sedated with 25 mcg IV fentanyl  and 2 mg IV Versed in addition to the medicine given for the previous EGD.  The Olympus video colonoscope was inserted into the rectum and advanced to  the cecum, confirmed by transillumination at McBurney's point and  visualization of the ileocecal valve and appendiceal orifice. The prep was  excellent. The cecum, ascending, transverse, descending and sigmoid colon  all appeared normal with no masses, polyps, diverticula or other mucosal  abnormalities. Within the rectum there was seen a small 8 mm sessile polyp  which was fulgurated by hot biopsy. The remainder of the rectum appeared  normal. The scope was then withdrawn and the patient returned to the  recovery room in stable condition. She tolerated the procedure well and  there were no immediate complications.   IMPRESSION:  Small rectal polyp otherwise normal study.   PLAN:  Await biopsy results to determine interval for next colonoscopy.                                               John C. Madilyn Fireman, M.D.    JCH/MEDQ  D:  05/15/2003  T:  05/15/2003  Job:  [id number]   cc:   Guy Sandifer. Arleta Creek, M.D.  [address]  [place]  [place] [address]  Fax: [phone]

## 2010-12-16 NOTE — H&P (Signed)
NAME:  Taylor, Stanley NO.:  [id number]   MEDICAL RECORD NO.:  [id number]          PATIENT TYPE:  EMS   LOCATION:  MAJO                         FACILITY:  [facility]   PHYSICIAN:  Willow Ora, MD           DATE OF BIRTH:  06/18/1956   DATE OF ADMISSION:  03/03/2006  DATE OF DISCHARGE:                                HISTORY & PHYSICAL   CHIEF COMPLAINT:  Chest pain.   HISTORY OF PRESENT ILLNESS:  Taylor Stanley is a 55 year old white female who  came to the ER due to chest pain.  She woke up today at 7:30 and felt  tightening  in the anterior lower chest.  At the same time, she felt hot  and flushed, had some nausea.  This episode lasted about 15 minutes.  EMS  was called and on her way to the hospital, she received nitroglycerin with  some relief of the pain.  There was a questionable radiation to the left  shoulder.  The patient really cannot comment, states if there was any clear  radiation.  She says that she has had similar feelings before, but they were  not as intense, and they did not have any nausea associated with it.  At  this point, the tightness is gone.  She is slightly sore at the left  shoulder.   PAST MEDICAL HISTORY:  1. Cholecystectomy in December 2006.  2. Hypertension.  3. Anxiety.  4. GERD status post EGD on October 2004 which was negative and a      colonoscopy at the same time that showed a rectal polyp.      Gastroenterology, Dr. Madilyn Fireman.  5. Tubal ligation.  6. She was told her cholesterol was a little off.  Apparently the good      cholesterol was low, and the triglyceride was high.   FAMILY HISTORY:  1. Sister has obesity and CHF.  No history of MI.  2. Father has a pacemaker , dementia,but no acute MI.  3. Mother had prior diagnosis of angina, and she has a pacemaker as well.   SOCIAL HISTORY:  The patient is married, has 2 children.  One of her  daughters is at bedside.  There is no drink and smoke a pack a day.   REVIEW OF SYSTEMS:   Denies any fever, admits to some cough, occasionally has  nausea and diarrhea.  No recent panic attacks, particularly no panicky  symptoms this morning.  They recently changed her Zoloft to generic  sertraline, and it seems at that time, she does not feel the same  emotionally; however, she cannot be more specific on that aspect.   MEDICATIONS:  1. Ativan p.r.n.  2. Benicar CT 40/12.5, 1 p.o. daily.  3. Plavix.  4. Sertraline 50, 1 p.o. daily.   ALLERGIES:  PENICILLIN.   PHYSICAL EXAMINATION:  GENERAL:  The patient is alert, oriented, in no  apparent distress.  VITAL SIGNS:  Blood pressure is 107/63, respiration 20, pulse initially 62,  repeated pulse is 50.  She is afebrile.  LUNGS:  Clear  to auscultation bilaterally.  CARDIOVASCULAR:  Regular rate and rhythm without murmur.  Chest wall is  nontender to palpation.  ABDOMEN:  Soft, nondistended, and nontender.  EXTREMITIES:  No edema.  NEUROLOGICAL EXAM:  Speech and motor is intact.  She is only slightly  anxious.   LABORATORY AND X-RAYS:  Hemoglobin is 12.6, white count 5.3, platelets 206,  potassium 3.6, creatinine  0.7, LFTs are normal; lipase is normal; cardiac  enzymes x1 is negative.  EKG is normal and convert to previous EKG in that  there are no changes.   ASSESSMENT/PLAN:  Taylor Stanley is admitted with chest pain.  Her risk factors  include tobacco abuse and a family history with her mother having angina.  She also has hypertension and apparently cholesterol is slightly elevated.  Her symptoms are not completely typical for heart disease and, at this  point, she will be admitted to telemetry.  We will cycle enzymes, recheck  her EKG in the morning.  If any of them come back abnormal or if she has  further chest pain, I will consult cardiology.  Otherwise, I believe that it  will be fine to discharge her home in the morning with soon a Cardiolite as  an outpatient.      Willow Ora, MD  Electronically Signed      JP/MEDQ  D:  03/03/2006  T:  03/03/2006  Job:  [id number]

## 2010-12-30 ENCOUNTER — Inpatient Hospital Stay (INDEPENDENT_AMBULATORY_CARE_PROVIDER_SITE_OTHER)
Admission: RE | Admit: 2010-12-30 | Discharge: 2010-12-30 | Disposition: A | Discharge status: Home / Self Care | Payer: BC Managed Care – PPO | Source: Ambulatory Visit | Attending: Emergency Medicine | Admitting: Emergency Medicine

## 2010-12-30 DIAGNOSIS — L259 Unspecified contact dermatitis, unspecified cause: Secondary | ICD-10-CM

## 2011-03-05 ENCOUNTER — Other Ambulatory Visit: Payer: Self-pay | Admitting: Family Medicine

## 2011-03-17 ENCOUNTER — Encounter: Payer: Self-pay | Admitting: Family Medicine

## 2011-03-20 ENCOUNTER — Encounter: Payer: Self-pay | Admitting: Family Medicine

## 2011-03-20 ENCOUNTER — Ambulatory Visit (INDEPENDENT_AMBULATORY_CARE_PROVIDER_SITE_OTHER): Payer: BC Managed Care – PPO | Admitting: Family Medicine

## 2011-03-20 DIAGNOSIS — R109 Unspecified abdominal pain: Secondary | ICD-10-CM | POA: Insufficient documentation

## 2011-03-20 DIAGNOSIS — M129 Arthropathy, unspecified: Secondary | ICD-10-CM

## 2011-03-20 NOTE — Progress Notes (Signed)
Subjective:    Patient ID: Taylor Stanley, female    DOB: 1956/01/24, 55 y.o.   MRN: [id number]  HPI  55 yo here for:  1. RUQ and epigastric discomfort for past month. Comes and goes.  Not aggrevated or alleviated by anything. Remote h/o cholecystectomy. UTD colonscopy- 04/2009. Some loose stools, no blood in stool. No nausea or vomiting.  2.  Bilateral hand pain- has a h/o carpal tunnel.  S/p release of left carpal tunnel. Hair dresser- wakes up, hands feel achy and "locked up." No erythema or warmth. No FH of RA but mom has Psoriasis.  Patient Active Problem List  Diagnoses  . HYPERLIPIDEMIA  . PANIC ATTACK  . Tobacco Use Disorder  . DRY EYE SYNDROME  . HYPERTENSION, BENIGN ESSENTIAL, CONTROLLED  . GERD  . CERVICAL RADICULOPATHY, RIGHT  . COLONIC POLYPS, HX OF  . CLIMACTERIC STATE, FEMALE  . DVT (deep venous thrombosis)  . DEEP VENOUS THROMBOPHLEBITIS, LEG, RIGHT  . FATIGUE  . Abdominal pain  . Arthropathy   Past Medical History  Diagnosis Date  . Hx of colonic polyps   . GERD (gastroesophageal reflux disease)   . Osteopenia    Past Surgical History  Procedure Date  . Cesarean section 10-94    BTL  . Lumbar disc surgery 1990's  . Cholecystectomy   . Carpal tunnel release    History  Substance Use Topics  . Smoking status: Never Smoker   . Smokeless tobacco: Not on file  . Alcohol Use: Not on file   Family History  Problem Relation Age of Onset  . Colon cancer Mother   . Obesity Mother   . Diabetes Mother   . Deep vein thrombosis Mother   . Hypertension Mother     pacemaker  . Emphysema Mother   . Thyroid disease Mother   . Hypertension Father     pacemaker  . Alzheimer's disease Father   . Hypothyroidism Father   . Diabetes Sister   . Obesity Sister   . Hypertension Sister   . COPD Sister   . Hypothyroidism Sister   . Thyroid disease Sister   . Fibromyalgia Sister   . Bipolar disorder Sister   . Hypothyroidism Sister   . Suicidality  Sister    Allergies  Allergen Reactions  . Etodolac     REACTION: Raw tongue  . Penicillins     REACTION: Swelling   Current Outpatient Prescriptions on File Prior to Visit  Medication Sig Dispense Refill  . BENICAR HCT 40-12.5 MG per tablet TAKE 1 TABLET EVERY DAY  90 tablet  2  . desloratadine (CLARINEX) 5 MG tablet Take 5 mg by mouth daily.        Marland Kitchen esomeprazole (NEXIUM) 40 MG capsule Take 40 mg by mouth daily.        . mometasone (ASMANEX 60 METERED DOSES) 220 MCG/INH inhaler Inhale 2 puffs into the lungs daily.        . mometasone (NASONEX) 50 MCG/ACT nasal spray Place 2 sprays into the nose daily.        . nitroGLYCERIN (NITROSTAT) 0.4 MG SL tablet Place 0.4 mg under the tongue as needed.        Marland Kitchen omega-3 acid ethyl esters (LOVAZA) 1 G capsule 2 g. Take 4 caps once daily - may do in divided doses       . sertraline (ZOLOFT) 100 MG tablet 100 mg. Take one half daily       . warfarin (  COUMADIN) 5 MG tablet Take by mouth as directed.         The PMH, PSH, Social History, Family History, Medications, and allergies have been reviewed in [facility], and have been updated if relevant.   Review of Systems See HPI    Objective:   Physical Exam BP 104/72  Pulse 71  Temp(Src) 98.6 F (37 C) (Oral)  General:  Well-developed,well-nourished,in no acute distress; alert,appropriate and cooperative throughout examination Head:  normocephalic and atraumatic.   Eyes:  vision grossly intact, pupils equal, pupils round, and pupils reactive to light.   Ears:  R ear normal and L ear normal.   Nose:  no external deformity.   Mouth:  good dentition.   Neck:  No deformities, masses, or tenderness noted. Lungs:  Normal respiratory effort, chest expands symmetrically. Lungs are clear to auscultation, no crackles or wheezes. Heart:  Normal rate and regular rhythm. S1 and S2 normal without gallop, murmur, click, rub or other extra sounds. Abdomen:  Bowel sounds positive,abdomen soft and non-tender  without masses, organomegaly or hernias noted. Msk:  No deformity or scoliosis noted of thoracic or lumbar spine.   Extremities:  Swollen dips and pips bilaterally, neg tinnels sign Neurologic:  alert & oriented X3 and gait normal.   Skin:  Intact without suspicious lesions or rashes Psych:  Cognition and judgment appear intact. Alert and cooperative with normal attention span and concentration. No apparent delusions, illusions, hallucinations        Assessment & Plan:   1. Abdominal pain   Etiology unclear at this point.  Exam unremarkable. Will check labs, pt taking  Nexium. If labs unremarkable, will send back to GI for endoscopy and possible repeat colonoscopy. Hepatic function panel, Lipase, Basic Metabolic Panel (BMET)  2. Arthropathy   New.  Likely component of OA and possibly carpal tunnel. Check labs to rule out rheumatological disease. CBC w/Diff, Rheumatoid Factor, Sed Rate (ESR), CRP High sensitivity, Basic Metabolic Panel (BMET)

## 2011-03-21 LAB — HEPATIC FUNCTION PANEL
ALT: 42 U/L — ABNORMAL HIGH (ref 0–35)
AST: 32 U/L (ref 0–37)
Albumin: 4.3 g/dL (ref 3.5–5.2)
Total Bilirubin: 0.4 mg/dL (ref 0.3–1.2)

## 2011-03-21 LAB — CBC WITH DIFFERENTIAL/PLATELET
Basophils Absolute: 0.2 10*3/uL — ABNORMAL HIGH (ref 0.0–0.1)
Eosinophils Absolute: 0.1 10*3/uL (ref 0.0–0.7)
HCT: 37.7 % (ref 36.0–46.0)
Hemoglobin: 12.9 g/dL (ref 12.0–15.0)
Lymphocytes Relative: 31.9 % (ref 12.0–46.0)
Lymphs Abs: 2 10*3/uL (ref 0.7–4.0)
MCHC: 34.2 g/dL (ref 30.0–36.0)
Monocytes Relative: 6.1 % (ref 3.0–12.0)
Neutro Abs: 3.6 10*3/uL (ref 1.4–7.7)
Platelets: 216 10*3/uL (ref 150.0–400.0)
RDW: 13.3 % (ref 11.5–14.6)

## 2011-03-21 LAB — BASIC METABOLIC PANEL
Calcium: 9.7 mg/dL (ref 8.4–10.5)
Creatinine, Ser: 0.8 mg/dL (ref 0.4–1.2)
GFR: 80.76 mL/min (ref >60.00)
Sodium: 142 mEq/L (ref 135–145)

## 2011-03-21 LAB — RHEUMATOID FACTOR: Rhuematoid fact SerPl-aCnc: <10 IU/mL (ref <=14)

## 2011-03-21 LAB — LIPASE: Lipase: 33 U/L (ref 11.0–59.0)

## 2011-03-22 NOTE — Progress Notes (Signed)
Addended by: Dianne Dun on: 03/22/2011 01:35 PM   Modules accepted: Orders

## 2011-03-28 ENCOUNTER — Ambulatory Visit
Admission: RE | Admit: 2011-03-28 | Discharge: 2011-03-28 | Disposition: A | Discharge status: Home / Self Care | Payer: BC Managed Care – PPO | Source: Ambulatory Visit | Attending: Family Medicine | Admitting: Family Medicine

## 2011-07-06 ENCOUNTER — Ambulatory Visit: Payer: BC Managed Care – PPO | Attending: Specialist

## 2011-07-06 DIAGNOSIS — IMO0001 Reserved for inherently not codable concepts without codable children: Secondary | ICD-10-CM | POA: Insufficient documentation

## 2011-07-06 DIAGNOSIS — M25619 Stiffness of unspecified shoulder, not elsewhere classified: Secondary | ICD-10-CM | POA: Insufficient documentation

## 2011-07-06 DIAGNOSIS — R293 Abnormal posture: Secondary | ICD-10-CM | POA: Insufficient documentation

## 2011-07-06 DIAGNOSIS — M25519 Pain in unspecified shoulder: Secondary | ICD-10-CM | POA: Insufficient documentation

## 2011-07-06 DIAGNOSIS — R5381 Other malaise: Secondary | ICD-10-CM | POA: Insufficient documentation

## 2011-07-17 ENCOUNTER — Ambulatory Visit: Payer: BC Managed Care – PPO

## 2011-07-19 ENCOUNTER — Ambulatory Visit: Payer: BC Managed Care – PPO

## 2011-07-24 ENCOUNTER — Ambulatory Visit: Payer: BC Managed Care – PPO

## 2011-07-27 ENCOUNTER — Ambulatory Visit: Payer: BC Managed Care – PPO

## 2011-08-10 ENCOUNTER — Ambulatory Visit (INDEPENDENT_AMBULATORY_CARE_PROVIDER_SITE_OTHER): Payer: BC Managed Care – PPO

## 2011-08-10 DIAGNOSIS — R05 Cough: Secondary | ICD-10-CM

## 2011-08-10 DIAGNOSIS — J019 Acute sinusitis, unspecified: Secondary | ICD-10-CM

## 2011-08-10 DIAGNOSIS — R5381 Other malaise: Secondary | ICD-10-CM

## 2011-08-10 DIAGNOSIS — R059 Cough, unspecified: Secondary | ICD-10-CM

## 2011-10-04 ENCOUNTER — Other Ambulatory Visit: Payer: Self-pay | Admitting: Neurosurgery

## 2011-10-04 DIAGNOSIS — M79602 Pain in left arm: Secondary | ICD-10-CM

## 2011-10-10 ENCOUNTER — Ambulatory Visit
Admission: RE | Admit: 2011-10-10 | Discharge: 2011-10-10 | Disposition: A | Discharge status: Home / Self Care | Payer: BC Managed Care – PPO | Source: Ambulatory Visit | Attending: Neurosurgery | Admitting: Neurosurgery

## 2011-10-10 DIAGNOSIS — M79602 Pain in left arm: Secondary | ICD-10-CM

## 2011-11-29 DIAGNOSIS — G5601 Carpal tunnel syndrome, right upper limb: Secondary | ICD-10-CM

## 2011-11-29 HISTORY — DX: Carpal tunnel syndrome, right upper limb: G56.01

## 2011-12-05 ENCOUNTER — Other Ambulatory Visit: Payer: Self-pay | Admitting: Orthopedic Surgery

## 2011-12-08 ENCOUNTER — Encounter (HOSPITAL_BASED_OUTPATIENT_CLINIC_OR_DEPARTMENT_OTHER): Payer: Self-pay | Admitting: *Deleted

## 2011-12-08 NOTE — Pre-Procedure Instructions (Signed)
To come for BMET and EKG 

## 2011-12-11 ENCOUNTER — Encounter (HOSPITAL_BASED_OUTPATIENT_CLINIC_OR_DEPARTMENT_OTHER)
Admission: RE | Admit: 2011-12-11 | Discharge: 2011-12-11 | Disposition: A | Discharge status: Home / Self Care | Payer: BC Managed Care – PPO | Source: Ambulatory Visit | Attending: Orthopedic Surgery | Admitting: Orthopedic Surgery

## 2011-12-11 LAB — BASIC METABOLIC PANEL
CO2: 27 mEq/L (ref 19–32)
Chloride: 100 mEq/L (ref 96–112)
Glucose, Bld: 116 mg/dL — ABNORMAL HIGH (ref 70–99)
Sodium: 138 mEq/L (ref 135–145)

## 2011-12-11 NOTE — H&P (Signed)
Taylor Stanley is an 56 y.o. female.   Chief Complaint: c/o chronic and progressive right hand numbness and tingling HPI: . 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 She was diagnosed to have bilateral carpal tunnel syndrome in 2009. She had left carpal tunnel release. She did not have surgery on the right. She has had progressive symptoms and now seeks an upper extremity orthopaedic consult.   Past Medical History  Diagnosis Date  . Hx of colonic polyps   . GERD (gastroesophageal reflux disease)   . Osteopenia   . PONV (postoperative nausea and vomiting)   . Seizure as an infant    x 1 - unknown cause  . TMJ disease   . Dental crowns present   . Seasonal allergies   . Carpal tunnel syndrome of right wrist 11/2011  . Asthma     triggered by allergies; does not use inhaler daily  . Hypertension     under control; has been on med. x 5 yrs.  . Snores     denies apnea    Past Surgical History  Procedure Date  . Cesarean section 10-94    BTL  . Lumbar disc surgery early 1990's  . Carpal tunnel release 05/08/2007    left  . Tubal ligation 1994  . Tonsillectomy and adenoidectomy as a child  . Wisdom tooth extraction   . Cholecystectomy 07/07/2005    lap. chole.  . Esophageal dilation 05/15/2003  . Cervical disc surgery 2011    Family History  Problem Relation Age of Onset  . Colon cancer Mother   . Obesity Mother   . Diabetes Mother   . Deep vein thrombosis Mother   . Hypertension Mother     pacemaker  . Emphysema Mother   . Thyroid disease Mother   . Hypertension Father     pacemaker  . Alzheimer's disease Father   . Hypothyroidism Father   . Diabetes Sister   . Obesity Sister   . Hypertension Sister   . COPD Sister   . Hypothyroidism Sister   . Thyroid disease Sister   . Fibromyalgia Sister   . Bipolar disorder Sister   . Hypothyroidism Sister   . Suicidality Sister    Social History:  reports that she has never smoked. She has never used smokeless  tobacco. She reports that she does not drink alcohol or use illicit drugs.  Allergies:  Allergies  Allergen Reactions  . Penicillins Hives and Swelling    No prescriptions prior to admission    Results for orders placed during the hospital encounter of 12/12/11 (from the past 48 hour(s))  BASIC METABOLIC PANEL     Status: Abnormal   Collection Time   12/11/11  8:30 AM      Component Value Range Comment   Sodium 138  135 - 145 (mEq/L)    Potassium 3.6  3.5 - 5.1 (mEq/L)    Chloride 100  96 - 112 (mEq/L)    CO2 27  19 - 32 (mEq/L)    Glucose, Bld 116 (*) 70 - 99 (mg/dL)    BUN 14  6 - 23 (mg/dL)    Creatinine, Ser 6.29  0.50 - 1.10 (mg/dL)    Calcium 9.5  8.4 - 10.5 (mg/dL)    GFR calc non Af Amer >90  >90 (mL/min)    GFR calc Af Amer >90  >90 (mL/min)     No results found.   Pertinent items are noted in HPI.  Height 5' (1.524 m), weight 68.04 kg (150 lb).  General appearance: alert Head: Normocephalic, without obvious abnormality Neck: supple, symmetrical, trachea midline Resp: clear to auscultation bilaterally Cardio: regular rate and rhythm GI: normal findings: bowel sounds normal Extremities:Physical exam reveals early Dupuytren's palmar fibromatosis of the pretendinous fibers of the left ring finger. She does not have active triggering in her fingers or thumbs bilaterally. She has no sign of stenosing tenosynovitis of the first dorsal compartments. Her pulses and capillary refill are intact. She has a positive wrist flexion test on the left at 1 minute. Her Tinel's sign is equivocal.  Pulses: 2+ and symmetric Skin: normal Neurologic: Grossly normal   Assessment/Plan Impression: Right CTS  Plan: To the OR for Right CTR. The procedure, risks and post-op course were discussed at length and she was in agreement with the plan.  DASNOIT,Twylah Bennetts J 12/11/2011, 2:44 PM    H&P documentation: 12/12/2011  -History and Physical Reviewed  -Patient has been  re-examined  -No change in the plan of care  Wyn Forster, MD

## 2011-12-12 ENCOUNTER — Ambulatory Visit (HOSPITAL_BASED_OUTPATIENT_CLINIC_OR_DEPARTMENT_OTHER): Payer: BC Managed Care – PPO | Admitting: Anesthesiology

## 2011-12-12 ENCOUNTER — Encounter (HOSPITAL_BASED_OUTPATIENT_CLINIC_OR_DEPARTMENT_OTHER): Payer: Self-pay | Admitting: Anesthesiology

## 2011-12-12 ENCOUNTER — Encounter (HOSPITAL_BASED_OUTPATIENT_CLINIC_OR_DEPARTMENT_OTHER): Payer: Self-pay | Admitting: *Deleted

## 2011-12-12 ENCOUNTER — Ambulatory Visit (HOSPITAL_BASED_OUTPATIENT_CLINIC_OR_DEPARTMENT_OTHER)
Admission: RE | Admit: 2011-12-12 | Discharge: 2011-12-12 | Disposition: A | Discharge status: Home / Self Care | Payer: BC Managed Care – PPO | Source: Ambulatory Visit | Attending: Orthopedic Surgery | Admitting: Orthopedic Surgery

## 2011-12-12 ENCOUNTER — Encounter (HOSPITAL_BASED_OUTPATIENT_CLINIC_OR_DEPARTMENT_OTHER)
Admission: RE | Disposition: A | Discharge status: Home / Self Care | Payer: Self-pay | Source: Ambulatory Visit | Attending: Orthopedic Surgery

## 2011-12-12 DIAGNOSIS — K219 Gastro-esophageal reflux disease without esophagitis: Secondary | ICD-10-CM | POA: Insufficient documentation

## 2011-12-12 DIAGNOSIS — G5600 Carpal tunnel syndrome, unspecified upper limb: Secondary | ICD-10-CM | POA: Insufficient documentation

## 2011-12-12 DIAGNOSIS — J45909 Unspecified asthma, uncomplicated: Secondary | ICD-10-CM | POA: Insufficient documentation

## 2011-12-12 DIAGNOSIS — Z01810 Encounter for preprocedural cardiovascular examination: Secondary | ICD-10-CM | POA: Insufficient documentation

## 2011-12-12 DIAGNOSIS — I10 Essential (primary) hypertension: Secondary | ICD-10-CM | POA: Insufficient documentation

## 2011-12-12 HISTORY — PX: CARPAL TUNNEL RELEASE: SHX101

## 2011-12-12 HISTORY — DX: Other seasonal allergic rhinitis: J30.2

## 2011-12-12 HISTORY — DX: Dental restoration status: Z98.811

## 2011-12-12 HISTORY — DX: Snoring: R06.83

## 2011-12-12 HISTORY — DX: Unspecified convulsions: R56.9

## 2011-12-12 HISTORY — DX: Carpal tunnel syndrome, right upper limb: G56.01

## 2011-12-12 HISTORY — DX: Nausea with vomiting, unspecified: R11.2

## 2011-12-12 HISTORY — DX: Other specified postprocedural states: Z98.890

## 2011-12-12 HISTORY — DX: Essential (primary) hypertension: I10

## 2011-12-12 HISTORY — DX: Unspecified temporomandibular joint disorder, unspecified side: M26.609

## 2011-12-12 LAB — POCT HEMOGLOBIN-HEMACUE: Hemoglobin: 13.5 g/dL (ref 12.0–15.0)

## 2011-12-12 SURGERY — CARPAL TUNNEL RELEASE
Anesthesia: General | Site: Wrist | Laterality: Right | Wound class: Clean

## 2011-12-12 MED ORDER — OXYCODONE-ACETAMINOPHEN 5-325 MG PO TABS: 1.0000 | ORAL_TABLET | ORAL | Status: AC | PRN

## 2011-12-12 MED ORDER — FENTANYL CITRATE 0.05 MG/ML IJ SOLN
INTRAMUSCULAR | Status: DC | PRN
  Administered 2011-12-12: 50 ug via INTRAVENOUS

## 2011-12-12 MED ORDER — LIDOCAINE HCL (CARDIAC) 20 MG/ML IV SOLN
INTRAVENOUS | Status: DC | PRN
  Administered 2011-12-12: 60 mg via INTRAVENOUS

## 2011-12-12 MED ORDER — LIDOCAINE HCL 2 % IJ SOLN
INTRAMUSCULAR | Status: DC | PRN
  Administered 2011-12-12: 3.5 mL

## 2011-12-12 MED ORDER — CHLORHEXIDINE GLUCONATE 4 % EX LIQD: 60.0000 mL | Freq: Once | CUTANEOUS | Status: DC

## 2011-12-12 MED ORDER — DEXAMETHASONE SODIUM PHOSPHATE 10 MG/ML IJ SOLN
INTRAMUSCULAR | Status: DC | PRN
  Administered 2011-12-12: 10 mg via INTRAVENOUS

## 2011-12-12 MED ORDER — PROPOFOL 10 MG/ML IV EMUL
INTRAVENOUS | Status: DC | PRN
  Administered 2011-12-12: 200 mg via INTRAVENOUS

## 2011-12-12 MED ORDER — MIDAZOLAM HCL 5 MG/5ML IJ SOLN
INTRAMUSCULAR | Status: DC | PRN
  Administered 2011-12-12: 2 mg via INTRAVENOUS

## 2011-12-12 MED ORDER — ONDANSETRON HCL 4 MG/2ML IJ SOLN
INTRAMUSCULAR | Status: DC | PRN
  Administered 2011-12-12: 4 mg via INTRAVENOUS

## 2011-12-12 MED ORDER — LACTATED RINGERS IV SOLN
INTRAVENOUS | Status: DC
  Administered 2011-12-12: 08:00:00 via INTRAVENOUS

## 2011-12-12 SURGICAL SUPPLY — 39 items
BANDAGE ADHESIVE 1X3 (GAUZE/BANDAGES/DRESSINGS) IMPLANT
BANDAGE ELASTIC 3 VELCRO ST LF (GAUZE/BANDAGES/DRESSINGS) ×2 IMPLANT
BLADE SURG 15 STRL LF DISP TIS (BLADE) ×1 IMPLANT
BLADE SURG 15 STRL SS (BLADE) ×2
BNDG CMPR 9X4 STRL LF SNTH (GAUZE/BANDAGES/DRESSINGS) ×1
BNDG ESMARK 4X9 LF (GAUZE/BANDAGES/DRESSINGS) ×1 IMPLANT
BRUSH SCRUB EZ PLAIN DRY (MISCELLANEOUS) ×2 IMPLANT
CLOTH BEACON ORANGE TIMEOUT ST (SAFETY) ×2 IMPLANT
CORDS BIPOLAR (ELECTRODE) ×1 IMPLANT
COVER MAYO STAND STRL (DRAPES) ×2 IMPLANT
COVER TABLE BACK 60X90 (DRAPES) ×2 IMPLANT
CUFF TOURNIQUET SINGLE 18IN (TOURNIQUET CUFF) ×1 IMPLANT
DECANTER SPIKE VIAL GLASS SM (MISCELLANEOUS) IMPLANT
DRAPE EXTREMITY T 121X128X90 (DRAPE) ×2 IMPLANT
DRAPE SURG 17X23 STRL (DRAPES) ×2 IMPLANT
GLOVE BIO SURGEON STRL SZ 6.5 (GLOVE) ×1 IMPLANT
GLOVE BIOGEL M STRL SZ7.5 (GLOVE) ×2 IMPLANT
GLOVE BIOGEL PI IND STRL 7.0 (GLOVE) IMPLANT
GLOVE BIOGEL PI INDICATOR 7.0 (GLOVE) ×1
GLOVE ORTHO TXT STRL SZ7.5 (GLOVE) ×2 IMPLANT
GOWN PREVENTION PLUS XLARGE (GOWN DISPOSABLE) ×2 IMPLANT
GOWN PREVENTION PLUS XXLARGE (GOWN DISPOSABLE) ×4 IMPLANT
NEEDLE 27GAX1X1/2 (NEEDLE) ×1 IMPLANT
PACK BASIN DAY SURGERY FS (CUSTOM PROCEDURE TRAY) ×2 IMPLANT
PAD CAST 3X4 CTTN HI CHSV (CAST SUPPLIES) ×1 IMPLANT
PADDING CAST ABS 4INX4YD NS (CAST SUPPLIES) ×1
PADDING CAST ABS COTTON 4X4 ST (CAST SUPPLIES) ×1 IMPLANT
PADDING CAST COTTON 3X4 STRL (CAST SUPPLIES) ×2
SPLINT PLASTER CAST XFAST 3X15 (CAST SUPPLIES) ×5 IMPLANT
SPLINT PLASTER XTRA FASTSET 3X (CAST SUPPLIES) ×5
SPONGE GAUZE 4X4 12PLY (GAUZE/BANDAGES/DRESSINGS) ×2 IMPLANT
STOCKINETTE 4X48 STRL (DRAPES) ×2 IMPLANT
STRIP CLOSURE SKIN 1/2X4 (GAUZE/BANDAGES/DRESSINGS) ×2 IMPLANT
SUT PROLENE 3 0 PS 2 (SUTURE) ×2 IMPLANT
SYR 3ML 23GX1 SAFETY (SYRINGE) IMPLANT
SYR CONTROL 10ML LL (SYRINGE) ×1 IMPLANT
TRAY DSU PREP LF (CUSTOM PROCEDURE TRAY) ×2 IMPLANT
UNDERPAD 30X30 INCONTINENT (UNDERPADS AND DIAPERS) ×2 IMPLANT
WATER STERILE IRR 1000ML POUR (IV SOLUTION) ×2 IMPLANT

## 2011-12-12 NOTE — Anesthesia Preprocedure Evaluation (Signed)
Anesthesia Evaluation  Patient identified by MRN, date of birth, ID band Patient awake    Reviewed: Allergy & Precautions, H&P , NPO status , Patient's Chart, lab work & pertinent test results, reviewed documented beta blocker date and time   History of Anesthesia Complications (+) PONV  Airway Mallampati: II TM Distance: >3 FB Neck ROM: full    Dental   Pulmonary asthma ,          Cardiovascular hypertension,     Neuro/Psych Seizures -,  PSYCHIATRIC DISORDERS  Neuromuscular disease    GI/Hepatic Neg liver ROS, GERD-  Medicated and Controlled,  Endo/Other  negative endocrine ROS  Renal/GU negative Renal ROS  negative genitourinary   Musculoskeletal   Abdominal   Peds  Hematology negative hematology ROS (+)   Anesthesia Other Findings See surgeon's H&P   Reproductive/Obstetrics negative OB ROS                           Anesthesia Physical Anesthesia Plan  ASA: II  Anesthesia Plan: General   Post-op Pain Management:    Induction: Intravenous  Airway Management Planned: LMA  Additional Equipment:   Intra-op Plan:   Post-operative Plan: Extubation in OR  Informed Consent: I have reviewed the patients History and Physical, chart, labs and discussed the procedure including the risks, benefits and alternatives for the proposed anesthesia with the patient or authorized representative who has indicated his/her understanding and acceptance.   Dental Advisory Given  Plan Discussed with: CRNA and Surgeon  Anesthesia Plan Comments:         Anesthesia Quick Evaluation

## 2011-12-12 NOTE — Op Note (Signed)
Op note dictated [id number]

## 2011-12-12 NOTE — Anesthesia Procedure Notes (Signed)
Procedure Name: LMA Insertion Date/Time: 12/12/2011 8:47 AM Performed by: Marris Frontera D Pre-anesthesia Checklist: Patient identified, Emergency Drugs available, Suction available and Patient being monitored Patient Re-evaluated:Patient Re-evaluated prior to inductionOxygen Delivery Method: Circle System Utilized Preoxygenation: Pre-oxygenation with 100% oxygen Intubation Type: IV induction Ventilation: Mask ventilation without difficulty LMA: LMA inserted LMA Size: 4.0 Number of attempts: 1 Placement Confirmation: positive ETCO2 Tube secured with: Tape Dental Injury: Teeth and Oropharynx as per pre-operative assessment

## 2011-12-12 NOTE — Discharge Instructions (Signed)
[facility] Instructions Hand Surgery  Wound Care: Keep your hand elevated above the level of your heart.  Do not allow it to dangle  by your side.  Keep the dressing dry and do not remove it unless your doctor advises you to do so.  He will usually change it at the time of your post-op visit.  Moving your fingers is advised to stimulate circulation but will depend on the site of your surgery.  If you have a splint applied, your doctor will advise you regarding movement.  Activity: Do not drive or operate machinery today.  Rest today and then you may return to your normal activity and work as indicated by your physician.  Diet:  Drink liquids today or eat a light diet.  You may resume a regular diet tomorrow.    General expectations: Pain for two to three days. Fingers may become slightly swollen.  Call your doctor if any of the following occur: Severe pain not relieved by pain medication. Elevated temperature. Dressing soaked with blood. Inability to move fingers. White or bluish color to fingers.  Post Anesthesia Home Care Instructions  Activity: Get plenty of rest for the remainder of the day. A responsible adult should stay with you for 24 hours following the procedure.  For the next 24 hours, DO NOT: -Drive a car -Advertising copywriter -Drink alcoholic beverages -Take any medication unless instructed by your physician -Make any legal decisions or sign important papers.  Meals: Start with liquid foods such as gelatin or soup. Progress to regular foods as tolerated. Avoid greasy, spicy, heavy foods. If nausea and/or vomiting occur, drink only clear liquids until the nausea and/or vomiting subsides. Call your physician if vomiting continues.  Special Instructions/Symptoms: Your throat may feel dry or sore from the anesthesia or the breathing tube placed in your throat during surgery. If this causes discomfort, gargle with warm salt water. The discomfort should disappear within 24  hours.        HAND SURGERY    HOME CARE INSTRUCTIONS    The following instructions have been prepared to help you care for yourself upon your return home today.  Wound Care:  Keep your hand elevated above the level of your heart. Do not allow it to dangle by your side. Keep the dressing dry and do not remove it unless your doctor advises you to do so. He will usually change it at the time of you post-op visit. Moving your fingers is advised to stimulate circulation but will depend on the site of your surgery. Of course, if you have a splint applied your doctor will advise you about movement.  Activity:  Do not drive or operate machinery today. Rest today and then you may return to your normal activity and work as indicated by your physician.  Diet: Drink liquids today or eat a light diet. You may resume a regular diet tomorrow.  General expectations: Pain for two or three days. Fingers may become slightly swollen.   Unexpected Observations- Call your doctor if any of these occur: Severe pain not relieved by pain medication. Elevated temperature. Dressing soaked with blood. Inability to move fingers. White or bluish color to fingers.  Return to office: ***

## 2011-12-12 NOTE — Op Note (Signed)
NAME:  Taylor Stanley, Taylor Stanley NO.:  [id number]  MEDICAL RECORD NO.:  [id number]  LOCATION:                                 FACILITY:  PHYSICIAN:  Taylor Fitch. Kaydi Kley, M.D. DATE OF BIRTH:  Jan 07, 1956  DATE OF PROCEDURE:  12/12/2011 DATE OF DISCHARGE:                              OPERATIVE REPORT   PREOPERATIVE DIAGNOSIS:  Significant right carpal tunnel syndrome.  POSTOPERATIVE DIAGNOSIS:  Significant right carpal tunnel syndrome.  OPERATION:  Release of right transverse carpal ligament.  OPERATING SURGEON:  Taylor Fitch. Kharter Sestak, M.D.  ASSISTANT:  Marveen Reeks. Dasnoit,  PA-C.  ANESTHESIA:  General by LMA.  SUPERVISED ANESTHESIOLOGIST:  Janetta Hora. Gelene Mink, M.D.  INDICATIONS:  Taylor Stanley is a 56 year old woman who referred for evaluation and management of significant hand numbness.  Clinical examination revealed signs of carpal tunnel syndrome.  Electrodiagnostic studies confirmed significant right median neuropathy.  Due to failure to respond to nonoperative measures, she was brought to the operating room at this time for release of her right transverse carpal ligament.  PROCEDURE:  Taylor Stanley was brought to room 2 of the [facility] and placed supine position on the operating table.  Following induction of general anesthesia by LMA technique, the right arm was prepped with Betadine soap and solution, sterilely draped. Following exsanguination of right arm with Esmarch bandage, arterial tourniquet was inflated to 220 mmHg.  Following routine surgical time-out, we proceeded with a short incision in the line of the ring finger in the palm.  Subcutaneous tissues were carefully divided to reveal the palmar fascia.  This split longitudinally to reveal the common sensory branch of the median nerve. These were followed back to the transcarpal ligament, which was gently isolated the median nerve.  The median nerve was separated with a Penfield 4 elevator,  creating a space between the median nerve in the ulnar bursa deep to the transverse carpal ligament.  Scissors were used to release the transverse carpal ligament along its ulnar border extending into the distal forearm.  This widely opened carpal canal.  Fibrous bands overlying the nerve distally were released with scissors.  Bleeding points along the margin of the released ligament were electrocauterized with bipolar current followed by repair of the skin with intradermal 3-0 Prolene suture.  A compressive dressing was applied with a volar plaster splint maintaining the wrist in 10 degrees of dorsiflexion.  For aftercare, Taylor Stanley is provided prescription for Percocet 5 mg 1 p.o. q.4 hours p.r.n. pain, 20 tablets, without refill.     Taylor Stanley, M.D.     RVS/MEDQ  D:  12/12/2011  T:  12/12/2011  Job:  [id number]  cc:   [facility]

## 2011-12-12 NOTE — Transfer of Care (Signed)
Immediate Anesthesia Transfer of Care Note  Patient: Taylor Stanley  Procedure(s) Performed: Procedure(s) (LRB): CARPAL TUNNEL RELEASE (Right)  Patient Location: PACU  Anesthesia Type: General  Level of Consciousness: awake and alert   Airway & Oxygen Therapy: Patient Spontanous Breathing and Patient connected to face mask oxygen  Post-op Assessment: Report given to PACU RN and Post -op Vital signs reviewed and stable  Post vital signs: Reviewed and stable  Complications: No apparent anesthesia complications

## 2011-12-12 NOTE — Brief Op Note (Signed)
12/12/2011  9:06 AM  PATIENT:  Joeseph Amor  56 y.o. female  PRE-OPERATIVE DIAGNOSIS:  right carpal tunnel syndrome  POST-OPERATIVE DIAGNOSIS:  right carpal tunnel syndrome  PROCEDURE:  Procedure(s) (LRB): CARPAL TUNNEL RELEASE (Right)  SURGEON:  Wyn Forster., MD   PHYSICIAN ASSISTANT:   ASSISTANTS: Mallory Shirk.A-C   ANESTHESIA:   general  EBL:  Total I/O In: 600 [I.V.:600] Out: -   BLOOD ADMINISTERED:none  DRAINS: none   LOCAL MEDICATIONS USED:  XYLOCAINE   SPECIMEN:  No Specimen  DISPOSITION OF SPECIMEN:  N/A  COUNTS:  YES  TOURNIQUET:  * Missing tourniquet times found for documented tourniquets in log:  [id number] *  DICTATION: .Other Dictation: Dictation Number [id number]  PLAN OF CARE: Discharge to home after PACU  PATIENT DISPOSITION:  PACU - hemodynamically stable.

## 2011-12-12 NOTE — Anesthesia Postprocedure Evaluation (Signed)
Anesthesia Post Note  Patient: Taylor Stanley  Procedure(s) Performed: Procedure(s) (LRB): CARPAL TUNNEL RELEASE (Right)  Anesthesia type: General  Patient location: PACU  Post pain: Pain level controlled  Post assessment: Patient's Cardiovascular Status Stable  Last Vitals:  Filed Vitals:   12/12/11 1000  BP: 134/79  Pulse: 57  Temp:   Resp: 10    Post vital signs: Reviewed and stable  Level of consciousness: alert  Complications: No apparent anesthesia complications

## 2011-12-15 ENCOUNTER — Encounter (HOSPITAL_BASED_OUTPATIENT_CLINIC_OR_DEPARTMENT_OTHER): Payer: Self-pay | Admitting: Orthopedic Surgery

## 2012-04-29 ENCOUNTER — Ambulatory Visit (INDEPENDENT_AMBULATORY_CARE_PROVIDER_SITE_OTHER): Payer: BC Managed Care – PPO | Admitting: Family Medicine

## 2012-04-29 ENCOUNTER — Encounter: Payer: Self-pay | Admitting: Family Medicine

## 2012-04-29 VITALS — BP 140/84 | HR 68 | Temp 98.1°F | Wt 148.0 lb

## 2012-04-29 DIAGNOSIS — R5383 Other fatigue: Secondary | ICD-10-CM

## 2012-04-29 DIAGNOSIS — R5381 Other malaise: Secondary | ICD-10-CM

## 2012-04-29 DIAGNOSIS — R1013 Epigastric pain: Secondary | ICD-10-CM | POA: Insufficient documentation

## 2012-04-29 LAB — COMPREHENSIVE METABOLIC PANEL
AST: 34 U/L (ref 0–37)
Albumin: 4.2 g/dL (ref 3.5–5.2)
Alkaline Phosphatase: 129 U/L — ABNORMAL HIGH (ref 39–117)
Calcium: 9.6 mg/dL (ref 8.4–10.5)
Chloride: 103 mEq/L (ref 96–112)
Glucose, Bld: 92 mg/dL (ref 70–99)
Potassium: 4.7 mEq/L (ref 3.5–5.1)
Sodium: 138 mEq/L (ref 135–145)
Total Protein: 7.6 g/dL (ref 6.0–8.3)

## 2012-04-29 LAB — CBC WITH DIFFERENTIAL/PLATELET
Basophils Relative: 0.6 % (ref 0.0–3.0)
Eosinophils Absolute: 0.2 10*3/uL (ref 0.0–0.7)
Lymphocytes Relative: 26.2 % (ref 12.0–46.0)
MCHC: 33.6 g/dL (ref 30.0–36.0)
Monocytes Relative: 7.6 % (ref 3.0–12.0)
Neutrophils Relative %: 63.1 % (ref 43.0–77.0)
RBC: 4.22 Mil/uL (ref 3.87–5.11)
WBC: 6.8 10*3/uL (ref 4.5–10.5)

## 2012-04-29 LAB — LIPASE: Lipase: 32 U/L (ref 11.0–59.0)

## 2012-04-29 NOTE — Progress Notes (Signed)
  Subjective:    Patient ID: Taylor Stanley, female    DOB: [birth year], 56 y.o.   MRN: [id number]  HPI  56 yo very pleasant female with a h/o GERD and esophageal stricture with remote h/o dilatation here for feeling sluggish and epigastric pain and belching for past several weeks. She is sleeping ok.  Has had some intermittent nausea, no vomiting.  Stools have been looser. Does not feel like she is under more stress.  Colonoscopy neg in 2010- sees Dr. Madilyn Fireman.  No blood or mucous in her stool.  No black stools.  Takes Nexium daily.  Has noticed liquids getting stuck from time to time when she swallows- food has not gotten stuck yet.  No fevers or chills. Does not drink alcohol.  Gaining weight which she is not happy about.  Wt Readings from Last 3 Encounters:  04/29/12 148 lb (67.132 kg)  12/08/11 150 lb (68.04 kg)  12/08/11 150 lb (68.04 kg)    Review of Systems    See HPI Pos for fatigue Neg for palpitations, changes in hair or skin Denies feeling depressed.  Objective:   Physical Exam BP 140/84  Pulse 68  Temp 98.1 F (36.7 C)  Wt 148 lb (67.132 kg)  General:  Well-developed,well-nourished,in no acute distress; alert,appropriate and cooperative throughout examination Head:  normocephalic and atraumatic.   Nose:  no external deformity.   Mouth:  good dentition.   Lungs:  Normal respiratory effort, chest expands symmetrically. Lungs are clear to auscultation, no crackles or wheezes. Heart:  Normal rate and regular rhythm. S1 and S2 normal without gallop, murmur, click, rub or other extra sounds. Abdomen:  Bowel sounds positive,abdomen soft  Mildly TTP over epigastrium Msk:  No deformity or scoliosis noted of thoracic or lumbar spine.   Extremities:  No clubbing, cyanosis, edema, or deformity noted with normal full range of motion of all joints.   Neurologic:  alert & oriented X3 and gait normal.   Skin:  Intact without suspicious lesions or rashes Psych:   Cognition and judgment appear intact. Alert and cooperative with normal attention span and concentration. No apparent delusions, illusions, hallucinations      Assessment & Plan:   1. Epigastric pain  Wide differential but symptoms seems consistent with GERD/PUD and possible IBS. Will check labs to rule out other possible contributing factors.   Start Pepcid, cut out gas producing foods. Also advised trial of Align (see pt instructions for details). Recommend f/u EGD with Dr. Madilyn Fireman- referral placed. The patient indicates understanding of these issues and agrees with the plan.  Comprehensive metabolic panel, Lipase, Ambulatory referral to Gastroenterology  2. Fatigue  Likely multifactorial but will check labs to rule out anemia, hypothyroidisim. CBC with Differential, TSH

## 2012-04-29 NOTE — Patient Instructions (Addendum)
Good to see you. Please stop by to see Taylor Stanley on your way out.  Pepcid 20 mg daily for next 2 weeks.  Exclude gas producing foods (beans, onions, celery, carrots, raisins, bananas, apricots, prunes, brussel sprouts, wheat germ, pretzels)   Trial of align for bloating/IBS symptoms (OTC).

## 2012-06-02 ENCOUNTER — Ambulatory Visit (INDEPENDENT_AMBULATORY_CARE_PROVIDER_SITE_OTHER): Payer: BC Managed Care – PPO | Admitting: Family Medicine

## 2012-06-02 ENCOUNTER — Ambulatory Visit: Payer: BC Managed Care – PPO

## 2012-06-02 VITALS — BP 131/80 | HR 69 | Temp 98.1°F | Resp 18 | Ht 61.0 in | Wt 147.6 lb

## 2012-06-02 DIAGNOSIS — M25552 Pain in left hip: Secondary | ICD-10-CM

## 2012-06-02 DIAGNOSIS — M79609 Pain in unspecified limb: Secondary | ICD-10-CM

## 2012-06-02 NOTE — Progress Notes (Signed)
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  Pain is worsened by hip flexion or hip internal rotation.  Also, the hip is painful when lying on left side.    No past hx of trauma or pain like this  H/O cervical disc surgery, lumbar disc surgery  Aleve afforded no relief  No fever  Objective:  Appears uncomfortable  Antalgic gait Hip ROM:  Exquisite pain with active hip flexion or passive Internal rotation.   Mildly tender inguinal ligament and greater trochanter area No hernia palpated.  [facility] reading (PRIMARY) by  Dr. Milus Glazier Left hip: loose body at upper lip of acetabulum.  Assessment:  Acute arthritis syndrome with loose body left hip  Plan:  Ortho referral.

## 2012-06-06 ENCOUNTER — Ambulatory Visit
Admission: RE | Admit: 2012-06-06 | Discharge: 2012-06-06 | Disposition: A | Discharge status: Home / Self Care | Payer: BC Managed Care – PPO | Source: Ambulatory Visit | Attending: Specialist | Admitting: Specialist

## 2012-06-06 ENCOUNTER — Other Ambulatory Visit: Payer: Self-pay | Admitting: Specialist

## 2012-06-06 ENCOUNTER — Other Ambulatory Visit: Payer: Self-pay | Admitting: Family Medicine

## 2012-06-06 DIAGNOSIS — M25559 Pain in unspecified hip: Secondary | ICD-10-CM

## 2012-06-06 DIAGNOSIS — I10 Essential (primary) hypertension: Secondary | ICD-10-CM

## 2012-06-06 DIAGNOSIS — E782 Mixed hyperlipidemia: Secondary | ICD-10-CM

## 2012-06-17 ENCOUNTER — Other Ambulatory Visit (INDEPENDENT_AMBULATORY_CARE_PROVIDER_SITE_OTHER): Payer: BC Managed Care – PPO

## 2012-06-17 DIAGNOSIS — I10 Essential (primary) hypertension: Secondary | ICD-10-CM

## 2012-06-17 DIAGNOSIS — E782 Mixed hyperlipidemia: Secondary | ICD-10-CM

## 2012-06-17 LAB — COMPREHENSIVE METABOLIC PANEL
CO2: 30 mEq/L (ref 19–32)
Creatinine, Ser: 0.8 mg/dL (ref 0.4–1.2)
GFR: 84.13 mL/min (ref >60.00)
Glucose, Bld: 110 mg/dL — ABNORMAL HIGH (ref 70–99)
Total Bilirubin: 0.4 mg/dL (ref 0.3–1.2)

## 2012-06-17 LAB — LIPID PANEL
Cholesterol: 187 mg/dL (ref 0–200)
HDL: 30.7 mg/dL — ABNORMAL LOW (ref >39.00)
VLDL: 27 mg/dL (ref 0.0–40.0)

## 2012-06-19 ENCOUNTER — Other Ambulatory Visit: Payer: Self-pay | Admitting: Family Medicine

## 2012-06-24 ENCOUNTER — Ambulatory Visit (INDEPENDENT_AMBULATORY_CARE_PROVIDER_SITE_OTHER): Payer: BC Managed Care – PPO | Admitting: Family Medicine

## 2012-06-24 ENCOUNTER — Encounter: Payer: Self-pay | Admitting: Family Medicine

## 2012-06-24 VITALS — BP 130/94 | HR 64 | Temp 97.9°F | Ht 61.0 in | Wt 147.0 lb

## 2012-06-24 DIAGNOSIS — R739 Hyperglycemia, unspecified: Secondary | ICD-10-CM

## 2012-06-24 DIAGNOSIS — E782 Mixed hyperlipidemia: Secondary | ICD-10-CM

## 2012-06-24 DIAGNOSIS — R7309 Other abnormal glucose: Secondary | ICD-10-CM

## 2012-06-24 DIAGNOSIS — Z23 Encounter for immunization: Secondary | ICD-10-CM

## 2012-06-24 DIAGNOSIS — Z0000 Encounter for general adult medical examination without abnormal findings: Secondary | ICD-10-CM | POA: Insufficient documentation

## 2012-06-24 DIAGNOSIS — I10 Essential (primary) hypertension: Secondary | ICD-10-CM

## 2012-06-24 NOTE — Progress Notes (Signed)
  Subjective:    Patient ID: Taylor Stanley, female    DOB: 20-May-1956, 56 y.o.   MRN: [id number]  HPI  55 yo very pleasant female here for CPX.  Doing well.  Has OBGYN- Taylor Stanley.  Last saw him in May.  Has gained weight lately, feels she is not keeping well to her diet.  She does like sugar. Wt Readings from Last 3 Encounters:  06/24/12 147 lb (66.679 kg)  06/02/12 147 lb 9.6 oz (66.951 kg)  04/29/12 148 lb (67.132 kg)   Lab Results  Component Value Date   TSH 3.12 06/17/2012      Fasting glucose 110.  Taylor Stanley has diabetes but per pt, she is morbidly obese.  Taylor Stanley no longer smokes and has no h/o CAD.  Lab Results  Component Value Date   CHOL 187 06/17/2012   HDL 30.70* 06/17/2012   LDLCALC 129* 06/17/2012   TRIG 135.0 06/17/2012   CHOLHDL 6 06/17/2012     Review of Systems    See HPI Patient reports no  vision/ hearing changes,anorexia, weight change, fever ,adenopathy, persistant / recurrent hoarseness, swallowing issues, chest pain, edema,persistant / recurrent cough, hemoptysis, dyspnea(rest, exertional, paroxysmal nocturnal), gastrointestinal  bleeding (melena, rectal bleeding), abdominal pain, excessive heart burn, GU symptoms(dysuria, hematuria, pyuria, voiding/incontinence  Issues) syncope, focal weakness, severe memory loss, concerning skin lesions, depression, anxiety, abnormal bruising/bleeding, major joint swelling, breast masses or abnormal vaginal bleeding.     Objective:   Physical Exam BP 130/94  Pulse 64  Temp 97.9 F (36.6 C)  Ht 5\' 1"  (1.549 m)  Wt 147 lb (66.679 kg)  BMI 27.78 kg/m2  General:  Well-developed,well-nourished,in no acute distress; alert,appropriate and cooperative throughout examination Head:  normocephalic and atraumatic.   Nose:  no external deformity.   Mouth:  good dentition.   Lungs:  Normal respiratory effort, chest expands symmetrically. Lungs are clear to auscultation, no crackles or wheezes. Heart:  Normal rate  and regular rhythm. S1 and S2 normal without gallop, murmur, click, rub or other extra sounds. Abdomen:  Bowel sounds positive,abdomen soft  Msk:  No deformity or scoliosis noted of thoracic or lumbar spine.   Extremities:  No clubbing, cyanosis, edema, or deformity noted with normal full range of motion of all joints.   Neurologic:  alert & oriented X3 and gait normal.   Skin:  Intact without suspicious lesions or rashes Psych:  Cognition and judgment appear intact. Alert and cooperative with normal attention span and concentration. No apparent delusions, illusions, hallucinations      Assessment & Plan:   1. HYPERLIPIDEMIA  Reasonable control on current meds. Did advise increase physical activity, decrease transfats and carbohydrates.   2. HYPERTENSION, BENIGN ESSENTIAL, CONTROLLED  Stable on current meds.   3. Routine general medical examination at a health care facility  Reviewed preventive care protocols, scheduled due services, and updated immunizations Discussed nutrition, exercise, diet, and healthy lifestyle.    4. Hyperglycemia  Check a1c- cbg normal 1 month ago. Hemoglobin A1c

## 2012-06-24 NOTE — Patient Instructions (Signed)
Good to see you. Have a Happy Thanksgiving.  We will call you with your lab results.

## 2012-06-24 NOTE — Addendum Note (Signed)
Addended by: Eliezer Bottom on: 06/24/2012 03:36 PM   Modules accepted: Orders

## 2012-08-26 ENCOUNTER — Other Ambulatory Visit: Payer: Self-pay | Admitting: *Deleted

## 2012-08-26 MED ORDER — OMEGA-3-ACID ETHYL ESTERS 1 G PO CAPS: ORAL_CAPSULE | ORAL | Status: DC

## 2012-10-12 ENCOUNTER — Other Ambulatory Visit: Payer: Self-pay | Admitting: Family Medicine

## 2012-12-12 IMAGING — CT CT PELVIS W/O CM
2 of 4 series · 13 of 32 positions shown, 18 images · non-contrast
Comparison: Radiographs dated 06/02/2012

CLINICAL DATA: Left hip pain.  Two falls on the last 4 weeks.

CT PELVIS WITHOUT CONTRAST
TECHNIQUE: Multidetector CT imaging of the pelvis was performed
following the standard protocol without intravenous contrast.
TECHNIQUE: 3-dimensional CT images were rendered by post-
processing of the original CT data at independent workstation.  The
3-dimensional CT images were interpreted, and findings were
reported in the accompanying complete CT report for this study.

[Series 3: pelvis standard · axial · 0.70mm/px · z∈[-184,-22]mm · 6 of 93 slices shown, 11 images]
[im 14/93  soft-tissue]
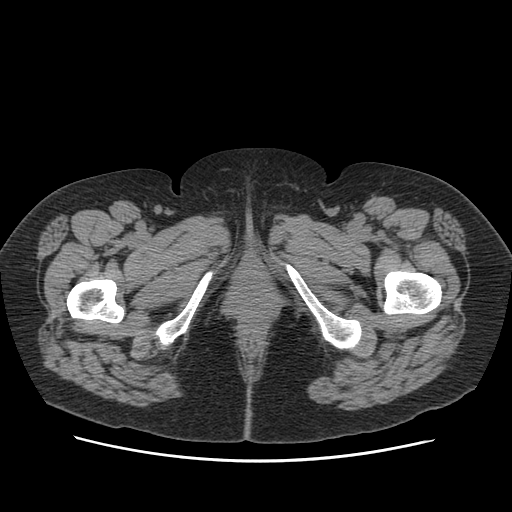
[im 14/93  bone]
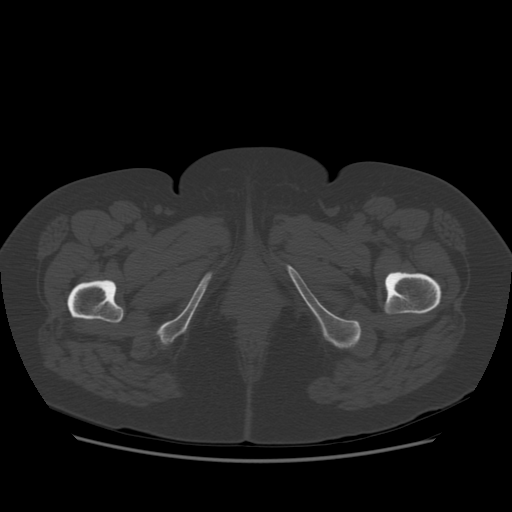
[im 27/93  soft-tissue]
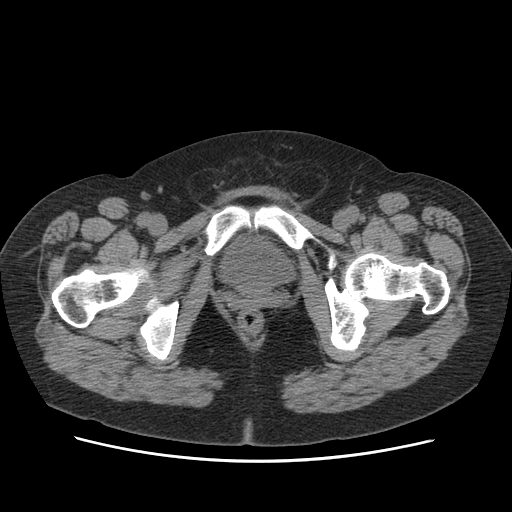
[im 40/93  soft-tissue]
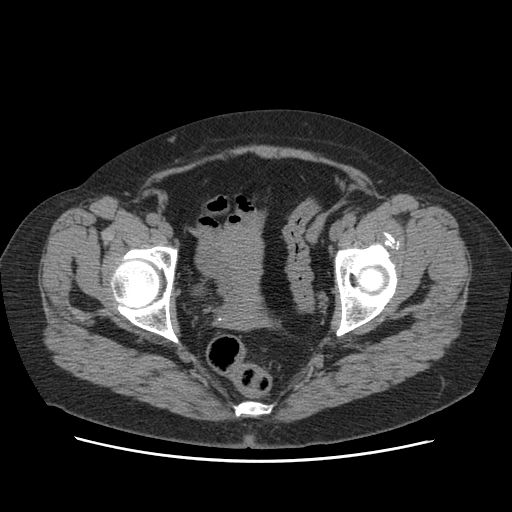
[im 40/93  lung]
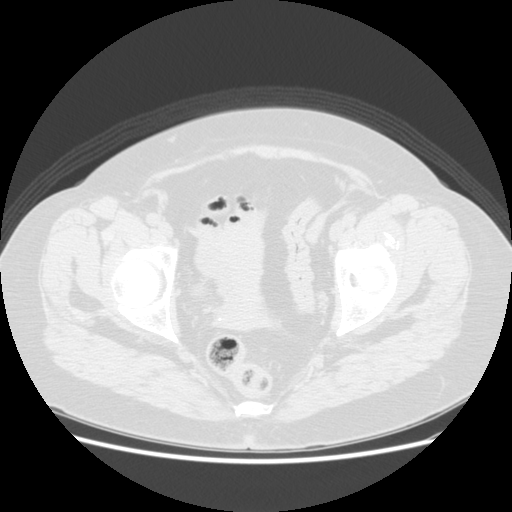
[im 53/93  soft-tissue]
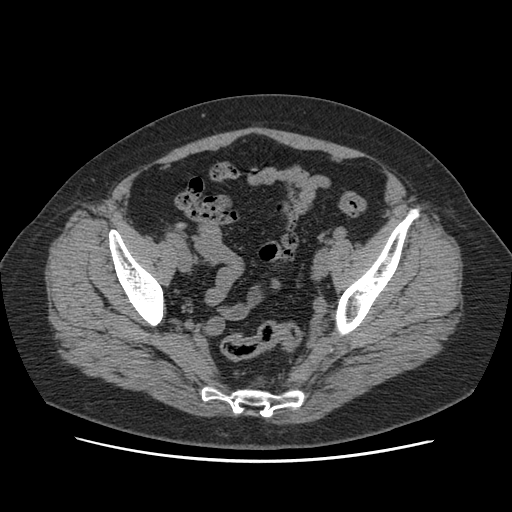
[im 53/93  lung]
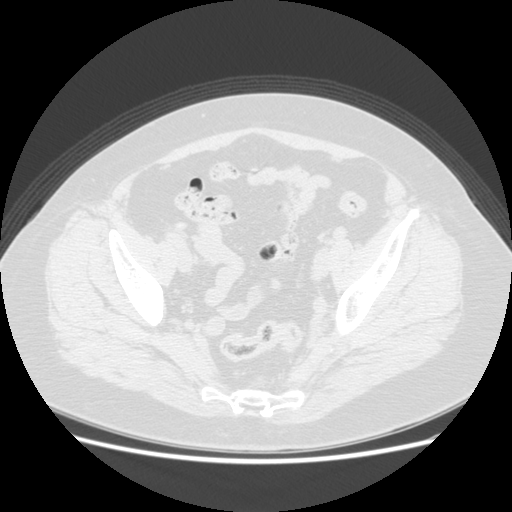
[im 66/93  soft-tissue]
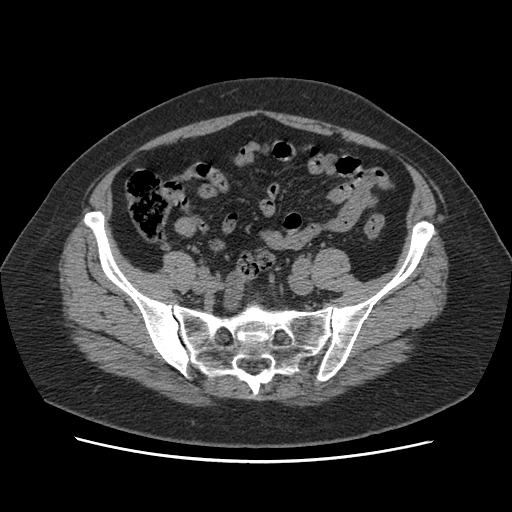
[im 66/93  lung]
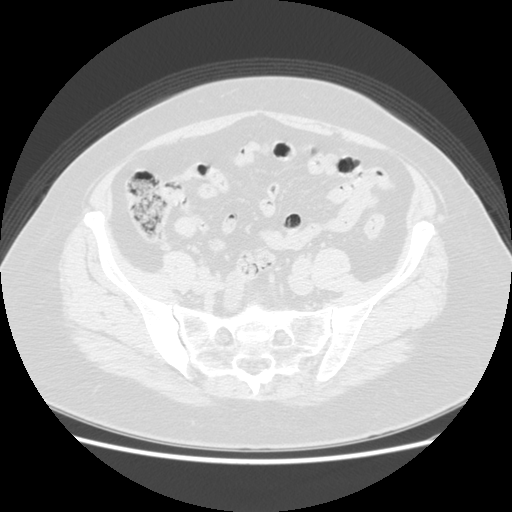
[im 79/93  soft-tissue]
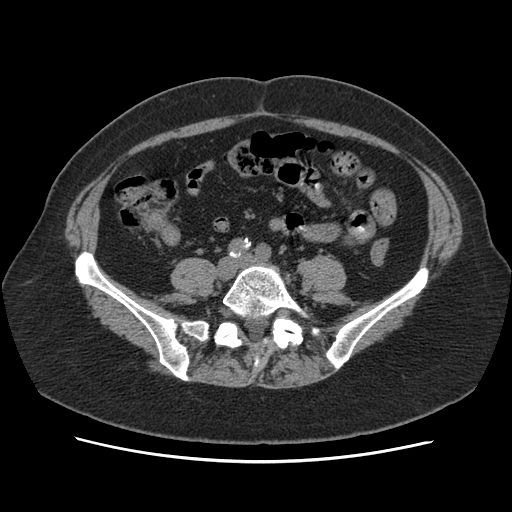
[im 79/93  lung]
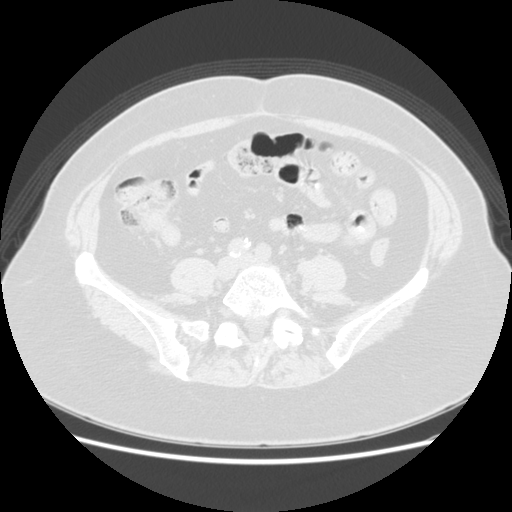

[Series 400: coronal · coronal · 0.70mm/px · 7 of 104 slices shown]
[im 13/104  soft-tissue]
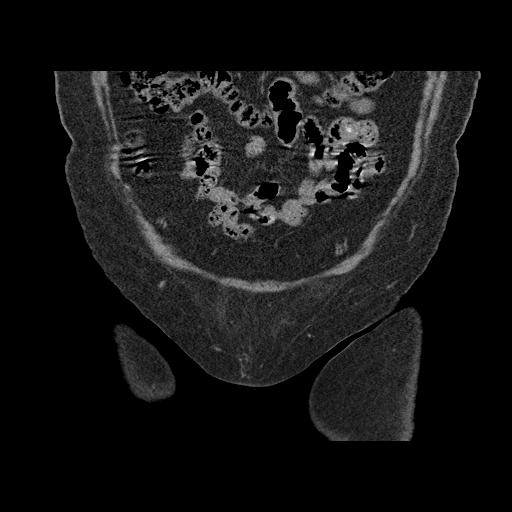
[im 26/104  soft-tissue]
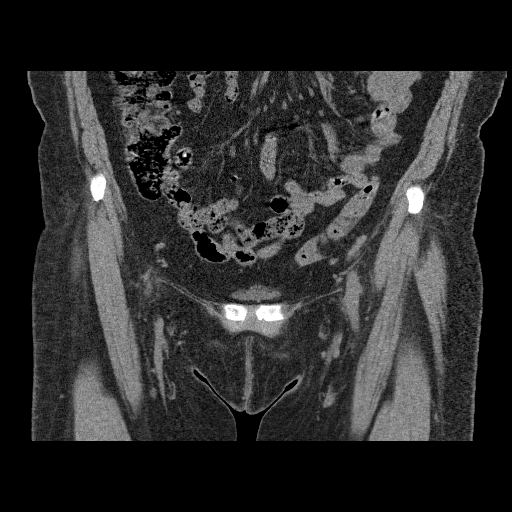
[im 39/104  soft-tissue]
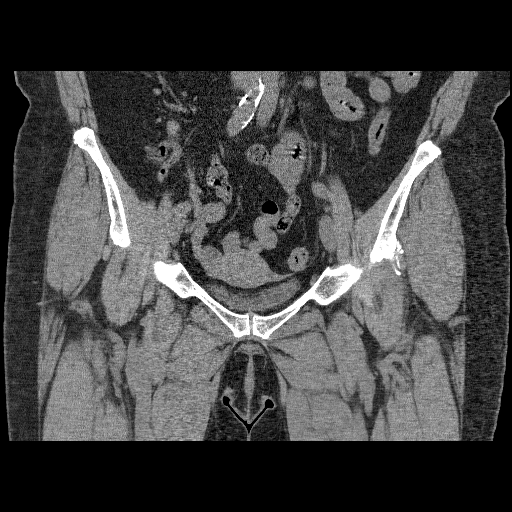
[im 52/104  soft-tissue]
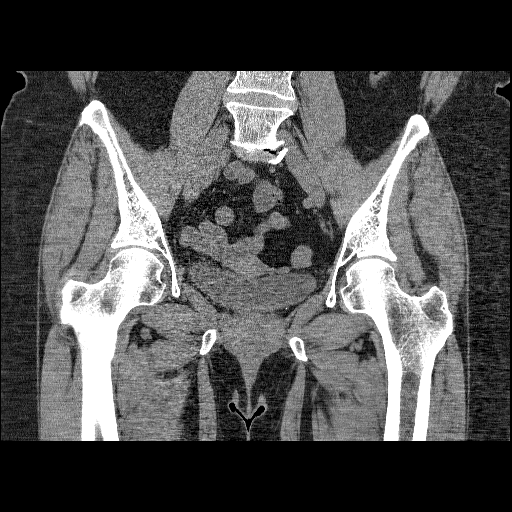
[im 65/104  soft-tissue]
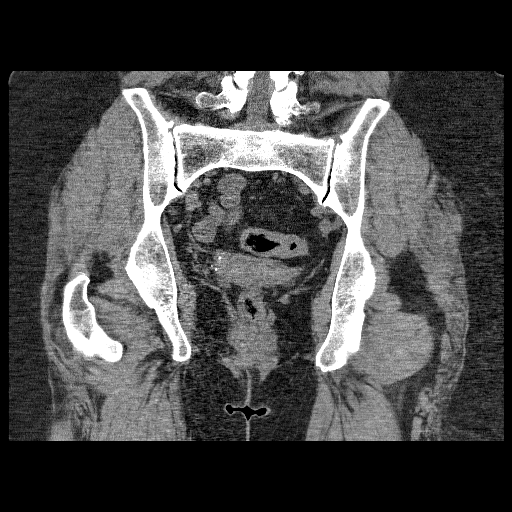
[im 78/104  soft-tissue]
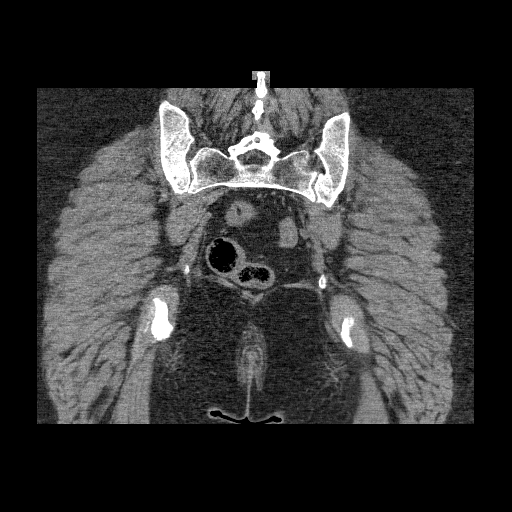
[im 91/104  soft-tissue]
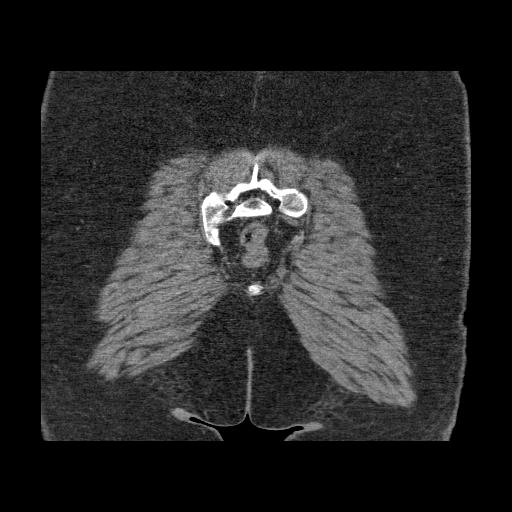

[13 of 32 positions shown; findings below may reference images not displayed]

FINDINGS: There is no fracture or dislocation at the left hip.
The sacrum and other pelvic bones are intact.  There are irregular
calcifications at the origin of the left biceps femoris muscle from
the anterior inferior aspect of the left iliac bone just above the
left hip joint.  I do not think these represent acute avulsions but
rather represent ossification secondary to an old prior injury.

No pelvic masses or adenopathy or other worrisome abnormality in
the pelvis.  Muscle structures around the hips and pelvis are
bilaterally symmetrical.  No hematomas.
IMPRESSION: No fractures or other acute abnormalities.  Dystrophic
calcifications in the proximal left biceps femoris muscle just
superior anterior to the left hip joint.


3-DIMENSIONAL CT IMAGE RENDERING AT INDEPENDENT WORKSTATION:

## 2013-06-20 ENCOUNTER — Other Ambulatory Visit: Payer: Self-pay | Admitting: Internal Medicine

## 2013-06-20 DIAGNOSIS — Z0000 Encounter for general adult medical examination without abnormal findings: Secondary | ICD-10-CM

## 2013-06-23 ENCOUNTER — Other Ambulatory Visit (INDEPENDENT_AMBULATORY_CARE_PROVIDER_SITE_OTHER): Payer: BC Managed Care – PPO

## 2013-06-23 DIAGNOSIS — Z0000 Encounter for general adult medical examination without abnormal findings: Secondary | ICD-10-CM

## 2013-06-23 LAB — COMPREHENSIVE METABOLIC PANEL
ALT: 29 U/L (ref 0–35)
Albumin: 4.3 g/dL (ref 3.5–5.2)
Alkaline Phosphatase: 123 U/L — ABNORMAL HIGH (ref 39–117)
Potassium: 3.6 mEq/L (ref 3.5–5.1)
Sodium: 137 mEq/L (ref 135–145)
Total Bilirubin: 0.5 mg/dL (ref 0.3–1.2)
Total Protein: 7.7 g/dL (ref 6.0–8.3)

## 2013-06-23 LAB — LIPID PANEL
LDL Cholesterol: 107 mg/dL — ABNORMAL HIGH (ref 0–99)
Total CHOL/HDL Ratio: 5
VLDL: 22.6 mg/dL (ref 0.0–40.0)

## 2013-06-23 LAB — TSH: TSH: 2.39 u[IU]/mL (ref 0.35–5.50)

## 2013-06-23 LAB — CBC
HCT: 39.6 % (ref 36.0–46.0)
MCV: 93.3 fl (ref 78.0–100.0)
RBC: 4.25 Mil/uL (ref 3.87–5.11)
RDW: 13.3 % (ref 11.5–14.6)
WBC: 6.3 10*3/uL (ref 4.5–10.5)

## 2013-06-25 ENCOUNTER — Encounter: Payer: BC Managed Care – PPO | Admitting: Internal Medicine

## 2013-06-30 ENCOUNTER — Encounter: Payer: BC Managed Care – PPO | Admitting: Internal Medicine

## 2013-07-07 ENCOUNTER — Telehealth: Payer: Self-pay

## 2013-07-07 NOTE — Telephone Encounter (Signed)
Pt request results from recent labs; pt has appt 07/21/13 for CPX but wanted lab results before appt.Please advise.

## 2013-07-07 NOTE — Telephone Encounter (Signed)
Patient notified as instructed by telephone. 

## 2013-07-07 NOTE — Telephone Encounter (Signed)
Labs are normal or stable. There has been no change since last year's lab

## 2013-07-08 ENCOUNTER — Encounter: Payer: BC Managed Care – PPO | Admitting: Internal Medicine

## 2013-07-21 ENCOUNTER — Encounter: Payer: Self-pay | Admitting: Internal Medicine

## 2013-07-21 ENCOUNTER — Ambulatory Visit (INDEPENDENT_AMBULATORY_CARE_PROVIDER_SITE_OTHER): Payer: BC Managed Care – PPO | Admitting: Internal Medicine

## 2013-07-21 VITALS — BP 142/80 | HR 71 | Temp 98.4°F | Ht 61.0 in | Wt 151.0 lb

## 2013-07-21 DIAGNOSIS — Z23 Encounter for immunization: Secondary | ICD-10-CM

## 2013-07-21 DIAGNOSIS — Z0000 Encounter for general adult medical examination without abnormal findings: Secondary | ICD-10-CM

## 2013-07-21 NOTE — Progress Notes (Signed)
Pre-visit discussion using our clinic review tool. No additional management support is needed unless otherwise documented below in the visit note.  

## 2013-07-21 NOTE — Patient Instructions (Signed)

## 2013-07-21 NOTE — Progress Notes (Signed)
Subjective:    Patient ID: Taylor Stanley, female    DOB: Dec 01, 1955, 58 y.o.   MRN: [id number]  HPI  Pt presents to the clinic today for her annual exam. She has no concerns today.  Flu: 2013- would like one today Tetanus: 2008 Pneumovax: 2010 Pap Smear: 04/2013 Mammogram: 04/2013 Colonoscopy: 2009 Bone Density scan: 2012 Eye Doctor: yearly   Dentist: biannually  Review of Systems      Past Medical History  Diagnosis Date  . Hx of colonic polyps   . GERD (gastroesophageal reflux disease)   . Osteopenia   . PONV (postoperative nausea and vomiting)   . Seizure as an infant    x 1 - unknown cause  . TMJ disease   . Dental crowns present   . Seasonal allergies   . Carpal tunnel syndrome of right wrist 11/2011  . Asthma     triggered by allergies; does not use inhaler daily  . Hypertension     under control; has been on med. x 5 yrs.  . Snores     denies apnea  . Anxiety   . Depression     Current Outpatient Prescriptions  Medication Sig Dispense Refill  . BENICAR HCT 40-12.5 MG per tablet TAKE 1 TABLET BY MOUTH EVERY DAY  90 tablet  1  . calcium carbonate (OS-CAL) 600 MG TABS Take 600 mg by mouth 2 (two) times daily with a meal.      . cholecalciferol (VITAMIN D) 1000 UNITS tablet Take 1,000 Units by mouth daily.      Marland Kitchen desloratadine (CLARINEX) 5 MG tablet Take 5 mg by mouth daily.        Marland Kitchen esomeprazole (NEXIUM) 40 MG capsule Take 40 mg by mouth daily. AM      . mometasone (ASMANEX 60 METERED DOSES) 220 MCG/INH inhaler Inhale 2 puffs into the lungs daily.        . mometasone (NASONEX) 50 MCG/ACT nasal spray Place 2 sprays into the nose daily.       . sertraline (ZOLOFT) 100 MG tablet 100 mg daily. Take one half daily      . vitamin E 100 UNIT capsule Take 100 Units by mouth daily.      . naproxen sodium (ANAPROX) 550 MG tablet Take 550 mg by mouth 2 (two) times daily with a meal.       No current facility-administered medications for this visit.    Allergies   Allergen Reactions  . Penicillins Hives and Swelling    Family History  Problem Relation Age of Onset  . Colon cancer Mother   . Obesity Mother   . Diabetes Mother   . Deep vein thrombosis Mother   . Hypertension Mother     pacemaker  . Emphysema Mother   . Thyroid disease Mother   . Hypertension Father     pacemaker  . Alzheimer's disease Father   . Hypothyroidism Father   . Diabetes Sister   . Obesity Sister   . Hypertension Sister   . COPD Sister   . Hypothyroidism Sister   . Thyroid disease Sister   . Fibromyalgia Sister   . Bipolar disorder Sister   . Hypothyroidism Sister   . Suicidality Sister     History   Social History  . Marital Status: Married    Spouse Name: N/A    Number of Children: 2  . Years of Education: N/A   Occupational History  . HAIRDRESSER  Social History Main Topics  . Smoking status: Former Games developer  . Smokeless tobacco: Never Used  . Alcohol Use: No  . Drug Use: No  . Sexual Activity: Not on file   Other Topics Concern  . Not on file   Social History Narrative  . No narrative on file     Constitutional: Denies fever, malaise, fatigue, headache or abrupt weight changes.  HEENT: Denies eye pain, eye redness, ear pain, ringing in the ears, wax buildup, runny nose, nasal congestion, bloody nose, or sore throat. Respiratory: Denies difficulty breathing, shortness of breath, cough or sputum production.   Cardiovascular: Denies chest pain, chest tightness, palpitations or swelling in the hands or feet.  Gastrointestinal: Denies abdominal pain, bloating, constipation, diarrhea or blood in the stool.  GU: Denies urgency, frequency, pain with urination, burning sensation, blood in urine, odor or discharge. Musculoskeletal: Pt reports joint stiffness. Denies decrease in range of motion, difficulty with gait, muscle pain or joint swelling.  Skin: Denies redness, rashes, lesions or ulcercations.  Neurological: Denies dizziness,  difficulty with memory, difficulty with speech or problems with balance and coordination.   No other specific complaints in a complete review of systems (except as listed in HPI above).  Objective:   Physical Exam   BP 142/80  Pulse 71  Temp(Src) 98.4 F (36.9 C) (Oral)  Ht 5\' 1"  (1.549 m)  Wt 151 lb (68.493 kg)  BMI 28.55 kg/m2  SpO2 97% Wt Readings from Last 3 Encounters:  07/21/13 151 lb (68.493 kg)  06/24/12 147 lb (66.679 kg)  06/02/12 147 lb 9.6 oz (66.951 kg)    General: Appears her stated age, well developed, well nourished in NAD. Skin: Warm, dry and intact. No rashes, lesions or ulcerations noted. HEENT: Head: normal shape and size; Eyes: sclera white, no icterus, conjunctiva pink, PERRLA and EOMs intact; Ears: Tm's gray and intact, normal light reflex; Nose: mucosa pink and moist, septum midline; Throat/Mouth: Teeth present, mucosa pink and moist, no exudate, lesions or ulcerations noted.  Neck: Normal range of motion. Neck supple, trachea midline. No massses, lumps or thyromegaly present.  Cardiovascular: Normal rate and rhythm. S1,S2 noted.  No murmur, rubs or gallops noted. No JVD or BLE edema. No carotid bruits noted. Pulmonary/Chest: Normal effort and positive vesicular breath sounds. No respiratory distress. No wheezes, rales or ronchi noted.  Abdomen: Soft and nontender. Normal bowel sounds, no bruits noted. No distention or masses noted. Liver, spleen and kidneys non palpable. Musculoskeletal: Normal range of motion. Herbdens nodes noted bilaterally. No signs of joint swelling. No difficulty with gait.  Neurological: Alert and oriented. Cranial nerves II-XII intact. Coordination normal. +DTRs bilaterally. Psychiatric: Mood and affect normal. Behavior is normal. Judgment and thought content normal.     BMET    Component Value Date/Time   NA 137 06/23/2013 0953   K 3.6 06/23/2013 0953   CL 101 06/23/2013 0953   CO2 28 06/23/2013 0953   GLUCOSE 94 06/23/2013  0953   BUN 16 06/23/2013 0953   CREATININE 0.7 06/23/2013 0953   CALCIUM 9.3 06/23/2013 0953   GFRNONAA >90 12/11/2011 0830   GFRAA >90 12/11/2011 0830    Lipid Panel     Component Value Date/Time   CHOL 160 06/23/2013 0953   TRIG 113.0 06/23/2013 0953   HDL 30.30* 06/23/2013 0953   CHOLHDL 5 06/23/2013 0953   VLDL 22.6 06/23/2013 0953   LDLCALC 107* 06/23/2013 0953    CBC    Component Value Date/Time  WBC 6.3 06/23/2013 0953   RBC 4.25 06/23/2013 0953   HGB 13.1 06/23/2013 0953   HCT 39.6 06/23/2013 0953   PLT 193.0 06/23/2013 0953   MCV 93.3 06/23/2013 0953   MCHC 33.0 06/23/2013 0953   RDW 13.3 06/23/2013 0953   LYMPHSABS 1.8 04/29/2012 1050   MONOABS 0.5 04/29/2012 1050   EOSABS 0.2 04/29/2012 1050   BASOSABS 0.0 04/29/2012 1050    Hgb A1C Lab Results  Component Value Date   HGBA1C 5.7 06/24/2012        Assessment & Plan:   Preventative Health Maintenance:  Labs reviewed- discussed ways to increase her HDL Flu vaccine today All other HM UTD  RTC in 1 year or sooner if needed

## 2013-10-31 ENCOUNTER — Encounter (HOSPITAL_COMMUNITY): Payer: Self-pay | Admitting: Emergency Medicine

## 2013-10-31 ENCOUNTER — Emergency Department (INDEPENDENT_AMBULATORY_CARE_PROVIDER_SITE_OTHER)
Admission: EM | Admit: 2013-10-31 | Discharge: 2013-10-31 | Disposition: A | Discharge status: Home / Self Care | Payer: BC Managed Care – PPO | Source: Home / Self Care | Attending: Emergency Medicine | Admitting: Emergency Medicine

## 2013-10-31 DIAGNOSIS — J309 Allergic rhinitis, unspecified: Secondary | ICD-10-CM

## 2013-10-31 MED ORDER — METHYLPREDNISOLONE 4 MG PO KIT: PACK | ORAL | Status: DC

## 2013-10-31 NOTE — ED Notes (Signed)
Visual acuity done with corrective glasses

## 2013-10-31 NOTE — ED Notes (Signed)
Patient complains of cough Chest congestion Has lost her voice Complains of bilateral eye pain

## 2013-10-31 NOTE — Discharge Instructions (Signed)
Allergies °Allergies may happen from anything your body is sensitive to. This may be food, medicines, pollens, chemicals, and nearly anything around you in everyday life that produces allergens. An allergen is anything that causes an allergy producing substance. Heredity is often a factor in causing these problems. This means you may have some of the same allergies as your parents. °Food allergies happen in all age groups. Food allergies are some of the most severe and life threatening. Some common food allergies are cow's milk, seafood, eggs, nuts, wheat, and soybeans. °SYMPTOMS  °· Swelling around the mouth. °· An itchy red rash or hives. °· Vomiting or diarrhea. °· Difficulty breathing. °SEVERE ALLERGIC REACTIONS ARE LIFE-THREATENING. °This reaction is called anaphylaxis. It can cause the mouth and throat to swell and cause difficulty with breathing and swallowing. In severe reactions only a trace amount of food (for example, peanut oil in a salad) may cause death within seconds. °Seasonal allergies occur in all age groups. These are seasonal because they usually occur during the same season every year. They may be a reaction to molds, grass pollens, or tree pollens. Other causes of problems are house dust mite allergens, pet dander, and mold spores. The symptoms often consist of nasal congestion, a runny itchy nose associated with sneezing, and tearing itchy eyes. There is often an associated itching of the mouth and ears. The problems happen when you come in contact with pollens and other allergens. Allergens are the particles in the air that the body reacts to with an allergic reaction. This causes you to release allergic antibodies. Through a chain of events, these eventually cause you to release histamine into the blood stream. Although it is meant to be protective to the body, it is this release that causes your discomfort. This is why you were given anti-histamines to feel better.  If you are unable to  pinpoint the offending allergen, it may be determined by skin or blood testing. Allergies cannot be cured but can be controlled with medicine. °Hay fever is a collection of all or some of the seasonal allergy problems. It may often be treated with simple over-the-counter medicine such as diphenhydramine. Take medicine as directed. Do not drink alcohol or drive while taking this medicine. Check with your caregiver or package insert for child dosages. °If these medicines are not effective, there are many new medicines your caregiver can prescribe. Stronger medicine such as nasal spray, eye drops, and corticosteroids may be used if the first things you try do not work well. Other treatments such as immunotherapy or desensitizing injections can be used if all else fails. Follow up with your caregiver if problems continue. These seasonal allergies are usually not life threatening. They are generally more of a nuisance that can often be handled using medicine. °HOME CARE INSTRUCTIONS  °· If unsure what causes a reaction, keep a diary of foods eaten and symptoms that follow. Avoid foods that cause reactions. °· If hives or rash are present: °· Take medicine as directed. °· You may use an over-the-counter antihistamine (diphenhydramine) for hives and itching as needed. °· Apply cold compresses (cloths) to the skin or take baths in cool water. Avoid hot baths or showers. Heat will make a rash and itching worse. °· If you are severely allergic: °· Following a treatment for a severe reaction, hospitalization is often required for closer follow-up. °· Wear a medic-alert bracelet or necklace stating the allergy. °· You and your family must learn how to give adrenaline or use   an anaphylaxis kit. °· If you have had a severe reaction, always carry your anaphylaxis kit or EpiPen® with you. Use this medicine as directed by your caregiver if a severe reaction is occurring. Failure to do so could have a fatal outcome. °SEEK MEDICAL  CARE IF: °· You suspect a food allergy. Symptoms generally happen within 30 minutes of eating a food. °· Your symptoms have not gone away within 2 days or are getting worse. °· You develop new symptoms. °· You want to retest yourself or your child with a food or drink you think causes an allergic reaction. Never do this if an anaphylactic reaction to that food or drink has happened before. Only do this under the care of a caregiver. °SEEK IMMEDIATE MEDICAL CARE IF:  °· You have difficulty breathing, are wheezing, or have a tight feeling in your chest or throat. °· You have a swollen mouth, or you have hives, swelling, or itching all over your body. °· You have had a severe reaction that has responded to your anaphylaxis kit or an EpiPen®. These reactions may return when the medicine has worn off. These reactions should be considered life threatening. °MAKE SURE YOU:  °· Understand these instructions. °· Will watch your condition. °· Will get help right away if you are not doing well or get worse. °Document Released: 10/10/2002 Document Revised: 11/11/2012 Document Reviewed: 03/16/2008 °[employer]® Patient Information ©2014 [employer]. ° °

## 2013-10-31 NOTE — ED Provider Notes (Signed)
CSN: [id number]     Arrival date & time 10/31/13  1903 History   First MD Initiated Contact with Patient 10/31/13 2021     Chief Complaint  Patient presents with  . Cough   (Consider location/radiation/quality/duration/timing/severity/associated sxs/prior Treatment) HPI Comments: 58 year old female presents complaining of 3 days of cough, runny nose, nasal congestion, itchy watery eyes, sinus pressure. This began as nasal congestion and runny nose and the other symptoms have started since then. She has a history of seasonal allergies and currently she is taking daily Clarinex but it does not seem to be helping as much right now. She has a prescription for Nasonex but she has not called use it. No recent travel or sick contacts. Other review of systems negative   Past Medical History  Diagnosis Date  . Hx of colonic polyps   . GERD (gastroesophageal reflux disease)   . Osteopenia   . PONV (postoperative nausea and vomiting)   . Seizure as an infant    x 1 - unknown cause  . TMJ disease   . Dental crowns present   . Seasonal allergies   . Carpal tunnel syndrome of right wrist 11/2011  . Asthma     triggered by allergies; does not use inhaler daily  . Hypertension     under control; has been on med. x 5 yrs.  . Snores     denies apnea  . Anxiety   . Depression    Past Surgical History  Procedure Laterality Date  . Cesarean section  10-94    BTL  . Lumbar disc surgery  early 1990's  . Carpal tunnel release  05/08/2007    left  . Tubal ligation  1994  . Tonsillectomy and adenoidectomy  as a child  . Wisdom tooth extraction    . Cholecystectomy  07/07/2005    lap. chole.  . Esophageal dilation  05/15/2003  . Cervical disc surgery  2011  . Carpal tunnel release  12/12/2011    Procedure: CARPAL TUNNEL RELEASE;  Surgeon: Wyn Forsterobert V Sypher Jr., MD;  Location: [facility];  Service: Orthopedics;  Laterality: Right;  . Spine surgery     Family History  Problem Relation  Age of Onset  . Colon cancer Mother   . Obesity Mother   . Diabetes Mother   . Deep vein thrombosis Mother   . Hypertension Mother     pacemaker  . Emphysema Mother   . Thyroid disease Mother   . Hypertension Father     pacemaker  . Alzheimer's disease Father   . Hypothyroidism Father   . Diabetes Sister   . Obesity Sister   . Hypertension Sister   . COPD Sister   . Hypothyroidism Sister   . Thyroid disease Sister   . Fibromyalgia Sister   . Bipolar disorder Sister   . Hypothyroidism Sister   . Suicidality Sister    History  Substance Use Topics  . Smoking status: Former Games developermoker  . Smokeless tobacco: Never Used  . Alcohol Use: No   OB History   Grav Para Term Preterm Abortions TAB SAB Ect Mult Living                 Review of Systems  Constitutional: Positive for fatigue. Negative for fever and chills.  HENT: Positive for congestion, postnasal drip, rhinorrhea, sinus pressure and sore throat. Negative for ear pain.   Respiratory: Positive for cough. Negative for chest tightness, shortness of breath and wheezing.  Cardiovascular: Negative for chest pain.  Gastrointestinal: Negative for nausea and vomiting.  All other systems reviewed and are negative.    Allergies  Penicillins  Home Medications   Current Outpatient Rx  Name  Route  Sig  Dispense  Refill  . BENICAR HCT 40-12.5 MG per tablet      TAKE 1 TABLET BY MOUTH EVERY DAY   90 tablet   1   . calcium carbonate (OS-CAL) 600 MG TABS   Oral   Take 600 mg by mouth 2 (two) times daily with a meal.         . cholecalciferol (VITAMIN D) 1000 UNITS tablet   Oral   Take 1,000 Units by mouth daily.         Marland Kitchen desloratadine (CLARINEX) 5 MG tablet   Oral   Take 5 mg by mouth daily.           Marland Kitchen esomeprazole (NEXIUM) 40 MG capsule   Oral   Take 40 mg by mouth daily. AM         . methylPREDNISolone (MEDROL DOSEPAK) 4 MG tablet      Use as directed on package instructions   21 tablet   0   .  mometasone (ASMANEX 60 METERED DOSES) 220 MCG/INH inhaler   Inhalation   Inhale 2 puffs into the lungs daily.           . mometasone (NASONEX) 50 MCG/ACT nasal spray   Nasal   Place 2 sprays into the nose daily.          . naproxen sodium (ANAPROX) 550 MG tablet   Oral   Take 550 mg by mouth 2 (two) times daily with a meal.         . sertraline (ZOLOFT) 100 MG tablet      100 mg daily. Take one half daily         . vitamin E 100 UNIT capsule   Oral   Take 100 Units by mouth daily.          BP 122/75  Pulse 70  Temp(Src) 98.1 F (36.7 C) (Oral)  Resp 16  SpO2 95% Physical Exam  Nursing note and vitals reviewed. Constitutional: She is oriented to person, place, and time. Vital signs are normal. She appears well-developed and well-nourished. No distress.  HENT:  Head: Normocephalic and atraumatic.  Right Ear: External ear normal.  Left Ear: External ear normal.  Nose: Mucosal edema and rhinorrhea present. Right sinus exhibits maxillary sinus tenderness. Right sinus exhibits no frontal sinus tenderness. Left sinus exhibits maxillary sinus tenderness. Left sinus exhibits no frontal sinus tenderness.  Mouth/Throat: Uvula is midline, oropharynx is clear and moist and mucous membranes are normal.  Eyes: Conjunctivae are normal. Right eye exhibits no discharge. Left eye exhibits no discharge.  Neck: Normal range of motion. Neck supple.  Cardiovascular: Normal rate, regular rhythm and normal heart sounds.  Exam reveals no gallop and no friction rub.   No murmur heard. Pulmonary/Chest: Effort normal and breath sounds normal. No respiratory distress. She has no wheezes. She has no rales.  Lymphadenopathy:    She has no cervical adenopathy.  Neurological: She is alert and oriented to person, place, and time. She has normal strength. Coordination normal.  Skin: Skin is warm and dry. No rash noted. She is not diaphoretic.  Psychiatric: She has a normal mood and affect.  Judgment normal.    ED Course  Procedures (including critical care time) Labs Review  Labs Reviewed - No data to display Imaging Review No results found.   MDM   1. Allergic rhinosinusitis    Most likely mediated by allergies, she will continue the Clarinex, restart her Nasonex, also prescribed Medrol Dosepak. Follow up if not improving    Meds ordered this encounter  Medications  . methylPREDNISolone (MEDROL DOSEPAK) 4 MG tablet    Sig: Use as directed on package instructions    Dispense:  21 tablet    Refill:  0    Order Specific Question:  Supervising Provider    Answer:  Bradd Canary D [[id number]]     Graylon Good, PA-C 11/01/13 1807

## 2013-11-01 NOTE — ED Provider Notes (Signed)
Medical screening examination/treatment/procedure(s) were performed by resident physician or non-physician practitioner and as supervising physician I was immediately available for consultation/collaboration.   Barkley BrunsKINDL,JAMES DOUGLAS MD.   Linna HoffJames D Kindl, MD 11/01/13 [phone]

## 2013-11-03 ENCOUNTER — Telehealth: Payer: Self-pay

## 2013-11-03 MED ORDER — BENZONATATE 200 MG PO CAPS: 200.0000 mg | ORAL_CAPSULE | Freq: Two times a day (BID) | ORAL | Status: DC | PRN

## 2013-11-03 NOTE — Telephone Encounter (Signed)
Pt was seen [facility] on 10/31/13 with allergies and cough; today pt has prod cough with greenish yellow phlegm with slight wheezing at times, no fever or SOB. Pt said she has coughed so hard pt has pain in mid chest when coughs. Pt is taking prednisone and mucinex. Pt request cough med. [facility]. Pt request cb.

## 2013-11-03 NOTE — Telephone Encounter (Signed)
Tessalon perles sent to pharmacy.

## 2013-11-03 NOTE — Telephone Encounter (Signed)
Lm on pts vm informing her Rx has been sent to requested pharmacy 

## 2013-11-12 ENCOUNTER — Other Ambulatory Visit: Payer: Self-pay | Admitting: Family Medicine

## 2013-11-12 NOTE — Telephone Encounter (Signed)
Last CPE 05/2012.

## 2013-11-12 NOTE — Telephone Encounter (Signed)
Ok to give 30 day supply only.  Must have CPE before further refills.

## 2013-11-12 NOTE — Telephone Encounter (Signed)
This pt had a CPE 06/2013, not 05/2012, may i fill it for 6 mos?

## 2013-11-13 NOTE — Telephone Encounter (Signed)
Yes ok to refill for 6 months.

## 2014-02-03 ENCOUNTER — Other Ambulatory Visit: Payer: Self-pay | Admitting: Orthopedic Surgery

## 2014-02-09 ENCOUNTER — Encounter (HOSPITAL_BASED_OUTPATIENT_CLINIC_OR_DEPARTMENT_OTHER): Payer: Self-pay | Admitting: *Deleted

## 2014-02-09 NOTE — H&P (Signed)
Taylor Stanley is an 58 y.o. female.   Chief Complaint: c/o chronic and progressive STS symptoms right index finger HPI: Ms. Trafton returns with a new problem affecting the right hand. She has a locking right index finger.  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 She is moderately busy at this time.  She has responded well in the past to injection of her left ring finger for stenosing tenosynovitis. This time however she had only limited relief from an injection and now requests surgery to resolve her chronic triggering.   Past Medical History  Diagnosis Date  . Hx of colonic polyps   . GERD (gastroesophageal reflux disease)   . Osteopenia   . PONV (postoperative nausea and vomiting)   . Seizure as an infant    x 1 - unknown cause  . TMJ disease   . Dental crowns present   . Seasonal allergies   . Carpal tunnel syndrome of right wrist 11/2011  . Asthma     triggered by allergies; does not use inhaler daily  . Hypertension     under control; has been on med. x 5 yrs.  . Snores     denies apnea  . Anxiety   . Depression     Past Surgical History  Procedure Laterality Date  . Cesarean section  10-94    BTL  . Lumbar disc surgery  early 1990's  . Carpal tunnel release  05/08/2007    left  . Tubal ligation  1994  . Tonsillectomy and adenoidectomy  as a child  . Wisdom tooth extraction    . Cholecystectomy  07/07/2005    lap. chole.  . Esophageal dilation  05/15/2003  . Cervical disc surgery  2011  . Carpal tunnel release  12/12/2011    Procedure: CARPAL TUNNEL RELEASE;  Surgeon: Wyn Forster., MD;  Location: [facility];  Service: Orthopedics;  Laterality: Right;  . Spine surgery      Family History  Problem Relation Age of Onset  . Colon cancer Mother   . Obesity Mother   . Diabetes Mother   . Deep vein thrombosis Mother   . Hypertension Mother     pacemaker  . Emphysema Mother   . Thyroid disease Mother   .  Hypertension Father     pacemaker  . Alzheimer's disease Father   . Hypothyroidism Father   . Diabetes Sister   . Obesity Sister   . Hypertension Sister   . COPD Sister   . Hypothyroidism Sister   . Thyroid disease Sister   . Fibromyalgia Sister   . Bipolar disorder Sister   . Hypothyroidism Sister   . Suicidality Sister    Social History:  reports that she has quit smoking. She has never used smokeless tobacco. She reports that she does not drink alcohol or use illicit drugs.  Allergies:  Allergies  Allergen Reactions  . Penicillins Hives and Swelling    No prescriptions prior to admission    No results found for this or any previous visit (from the past 48 hour(s)).  No results found.   Pertinent items are noted in HPI.  There were no vitals taken for this visit.  General appearance: alert Head: Normocephalic, without obvious abnormality Neck: supple, symmetrical, trachea midline Resp: clear to auscultation bilaterally Cardio: regular rate and rhythm GI: normal findings: bowel sounds normal Extremities: :  Exam reveals a well appearing 58 year-old woman.  She has mild  swelling over  the flexor sheath of the right index finger.  She has full range of motion of the long, ring and small fingers, active locking of the index finger PIP joint in flexion.  Her neurovascular examination is intact.     Pulses: 2+ and symmetric Skin: normal Neurologic: Grossly normal    Assessment/Plan Impression: Chronic and progressive STS right index finger  Plan: To the OR for release A-1 pulley right index finger.The procedure, risks,benefits and post-op course were discussed with the patient at length and they were in agreement with the plan.  DASNOIT,Hakim Minniefield J 02/09/2014, 3:41 PM   H&P documentation: 02/12/2014  -History and Physical Reviewed  -Patient has been re-examined  -No change in the plan of care  Wyn Forsterobert V Ruther Ephraim Jr, MD

## 2014-02-10 ENCOUNTER — Encounter (HOSPITAL_BASED_OUTPATIENT_CLINIC_OR_DEPARTMENT_OTHER): Payer: Self-pay | Admitting: *Deleted

## 2014-02-10 NOTE — Progress Notes (Signed)
Has been here in past-to come in for [id number]

## 2014-02-11 ENCOUNTER — Encounter (HOSPITAL_BASED_OUTPATIENT_CLINIC_OR_DEPARTMENT_OTHER)
Admission: RE | Admit: 2014-02-11 | Discharge: 2014-02-11 | Disposition: A | Discharge status: Home / Self Care | Payer: BC Managed Care – PPO | Source: Ambulatory Visit | Attending: Orthopedic Surgery | Admitting: Orthopedic Surgery

## 2014-02-11 LAB — BASIC METABOLIC PANEL
Anion gap: 14 (ref 5–15)
BUN: 13 mg/dL (ref 6–23)
CO2: 28 meq/L (ref 19–32)
Calcium: 9.5 mg/dL (ref 8.4–10.5)
Chloride: 103 mEq/L (ref 96–112)
Creatinine, Ser: 0.65 mg/dL (ref 0.50–1.10)
GFR calc Af Amer: >90 mL/min (ref >90)
GFR calc non Af Amer: >90 mL/min (ref >90)
Glucose, Bld: 94 mg/dL (ref 70–99)
Potassium: 4 mEq/L (ref 3.7–5.3)
Sodium: 145 mEq/L (ref 137–147)

## 2014-02-12 ENCOUNTER — Ambulatory Visit (HOSPITAL_BASED_OUTPATIENT_CLINIC_OR_DEPARTMENT_OTHER): Payer: BC Managed Care – PPO | Admitting: Anesthesiology

## 2014-02-12 ENCOUNTER — Encounter (HOSPITAL_BASED_OUTPATIENT_CLINIC_OR_DEPARTMENT_OTHER)
Admission: RE | Disposition: A | Discharge status: Home / Self Care | Payer: Self-pay | Source: Ambulatory Visit | Attending: Orthopedic Surgery

## 2014-02-12 ENCOUNTER — Encounter (HOSPITAL_BASED_OUTPATIENT_CLINIC_OR_DEPARTMENT_OTHER): Payer: BC Managed Care – PPO | Admitting: Anesthesiology

## 2014-02-12 ENCOUNTER — Ambulatory Visit (HOSPITAL_BASED_OUTPATIENT_CLINIC_OR_DEPARTMENT_OTHER)
Admission: RE | Admit: 2014-02-12 | Discharge: 2014-02-12 | Disposition: A | Discharge status: Home / Self Care | Payer: BC Managed Care – PPO | Source: Ambulatory Visit | Attending: Orthopedic Surgery | Admitting: Orthopedic Surgery

## 2014-02-12 ENCOUNTER — Encounter (HOSPITAL_BASED_OUTPATIENT_CLINIC_OR_DEPARTMENT_OTHER): Payer: Self-pay | Admitting: Anesthesiology

## 2014-02-12 DIAGNOSIS — I10 Essential (primary) hypertension: Secondary | ICD-10-CM | POA: Insufficient documentation

## 2014-02-12 DIAGNOSIS — Z880 Allergy status to penicillin: Secondary | ICD-10-CM | POA: Insufficient documentation

## 2014-02-12 DIAGNOSIS — F3289 Other specified depressive episodes: Secondary | ICD-10-CM | POA: Insufficient documentation

## 2014-02-12 DIAGNOSIS — F329 Major depressive disorder, single episode, unspecified: Secondary | ICD-10-CM | POA: Insufficient documentation

## 2014-02-12 DIAGNOSIS — M899 Disorder of bone, unspecified: Secondary | ICD-10-CM | POA: Insufficient documentation

## 2014-02-12 DIAGNOSIS — J45909 Unspecified asthma, uncomplicated: Secondary | ICD-10-CM | POA: Insufficient documentation

## 2014-02-12 DIAGNOSIS — M729 Fibroblastic disorder, unspecified: Secondary | ICD-10-CM | POA: Insufficient documentation

## 2014-02-12 DIAGNOSIS — M6530 Trigger finger, unspecified finger: Secondary | ICD-10-CM | POA: Insufficient documentation

## 2014-02-12 DIAGNOSIS — Z860100 Personal history of colon polyps, unspecified: Secondary | ICD-10-CM | POA: Insufficient documentation

## 2014-02-12 DIAGNOSIS — M26609 Unspecified temporomandibular joint disorder, unspecified side: Secondary | ICD-10-CM | POA: Insufficient documentation

## 2014-02-12 DIAGNOSIS — M949 Disorder of cartilage, unspecified: Secondary | ICD-10-CM

## 2014-02-12 DIAGNOSIS — K219 Gastro-esophageal reflux disease without esophagitis: Secondary | ICD-10-CM | POA: Insufficient documentation

## 2014-02-12 DIAGNOSIS — F411 Generalized anxiety disorder: Secondary | ICD-10-CM | POA: Insufficient documentation

## 2014-02-12 DIAGNOSIS — Z87891 Personal history of nicotine dependence: Secondary | ICD-10-CM | POA: Insufficient documentation

## 2014-02-12 HISTORY — PX: TRIGGER FINGER RELEASE: SHX641

## 2014-02-12 HISTORY — DX: Presence of spectacles and contact lenses: Z97.3

## 2014-02-12 LAB — POCT HEMOGLOBIN-HEMACUE: Hemoglobin: 13.2 g/dL (ref 12.0–15.0)

## 2014-02-12 SURGERY — RELEASE, A1 PULLEY, FOR TRIGGER FINGER
Anesthesia: General | Site: Finger | Laterality: Right

## 2014-02-12 MED ORDER — LIDOCAINE HCL 2 % IJ SOLN
INTRAMUSCULAR | Status: AC
Start: 2014-02-12 — End: 2014-02-12
  Filled 2014-02-12: qty 20

## 2014-02-12 MED ORDER — LACTATED RINGERS IV SOLN
INTRAVENOUS | Status: DC
  Administered 2014-02-12: 10 mL/h via INTRAVENOUS
  Administered 2014-02-12: 09:00:00 via INTRAVENOUS

## 2014-02-12 MED ORDER — FENTANYL CITRATE 0.05 MG/ML IJ SOLN: 50.0000 ug | INTRAMUSCULAR | Status: DC | PRN

## 2014-02-12 MED ORDER — OXYCODONE HCL 5 MG PO TABS: 5.0000 mg | ORAL_TABLET | Freq: Once | ORAL | Status: DC | PRN

## 2014-02-12 MED ORDER — MIDAZOLAM HCL 2 MG/2ML IJ SOLN
INTRAMUSCULAR | Status: AC
  Filled 2014-02-12: qty 2

## 2014-02-12 MED ORDER — METOCLOPRAMIDE HCL 5 MG/ML IJ SOLN: 10.0000 mg | Freq: Once | INTRAMUSCULAR | Status: DC | PRN

## 2014-02-12 MED ORDER — OXYCODONE-ACETAMINOPHEN 5-325 MG PO TABS: ORAL_TABLET | ORAL | Status: DC

## 2014-02-12 MED ORDER — OXYCODONE HCL 5 MG/5ML PO SOLN: 5.0000 mg | Freq: Once | ORAL | Status: DC | PRN

## 2014-02-12 MED ORDER — GLYCOPYRROLATE 0.2 MG/ML IJ SOLN
INTRAMUSCULAR | Status: DC | PRN
  Administered 2014-02-12: 0.2 mg via INTRAVENOUS

## 2014-02-12 MED ORDER — FENTANYL CITRATE 0.05 MG/ML IJ SOLN
INTRAMUSCULAR | Status: AC
  Filled 2014-02-12: qty 6

## 2014-02-12 MED ORDER — LIDOCAINE HCL 2 % IJ SOLN
INTRAMUSCULAR | Status: DC | PRN
Start: 2014-02-12 — End: 2014-02-12
  Administered 2014-02-12: 3 mL

## 2014-02-12 MED ORDER — PROPOFOL 10 MG/ML IV BOLUS
INTRAVENOUS | Status: DC | PRN
  Administered 2014-02-12: 160 mg via INTRAVENOUS

## 2014-02-12 MED ORDER — MIDAZOLAM HCL 5 MG/5ML IJ SOLN
INTRAMUSCULAR | Status: DC | PRN
  Administered 2014-02-12: 2 mg via INTRAVENOUS

## 2014-02-12 MED ORDER — DEXAMETHASONE SODIUM PHOSPHATE 4 MG/ML IJ SOLN
INTRAMUSCULAR | Status: DC | PRN
  Administered 2014-02-12: 8 mg via INTRAVENOUS

## 2014-02-12 MED ORDER — HYDROMORPHONE HCL PF 1 MG/ML IJ SOLN: 0.2500 mg | INTRAMUSCULAR | Status: DC | PRN

## 2014-02-12 MED ORDER — ONDANSETRON HCL 4 MG/2ML IJ SOLN
INTRAMUSCULAR | Status: DC | PRN
  Administered 2014-02-12: 4 mg via INTRAVENOUS

## 2014-02-12 MED ORDER — CHLORHEXIDINE GLUCONATE 4 % EX LIQD: 60.0000 mL | Freq: Once | CUTANEOUS | Status: DC

## 2014-02-12 MED ORDER — LIDOCAINE HCL (CARDIAC) 20 MG/ML IV SOLN
INTRAVENOUS | Status: DC | PRN
  Administered 2014-02-12: 75 mg via INTRAVENOUS

## 2014-02-12 MED ORDER — MIDAZOLAM HCL 2 MG/2ML IJ SOLN: 1.0000 mg | INTRAMUSCULAR | Status: DC | PRN

## 2014-02-12 MED ORDER — FENTANYL CITRATE 0.05 MG/ML IJ SOLN
INTRAMUSCULAR | Status: DC | PRN
  Administered 2014-02-12 (×2): 50 ug via INTRAVENOUS

## 2014-02-12 SURGICAL SUPPLY — 40 items
BANDAGE ADH SHEER 1  50/CT (GAUZE/BANDAGES/DRESSINGS) IMPLANT
BANDAGE COBAN STERILE 2 (GAUZE/BANDAGES/DRESSINGS) ×2 IMPLANT
BLADE SURG 15 STRL LF DISP TIS (BLADE) ×1 IMPLANT
BLADE SURG 15 STRL SS (BLADE) ×2
BNDG CMPR 9X4 STRL LF SNTH (GAUZE/BANDAGES/DRESSINGS) ×1
BNDG ESMARK 4X9 LF (GAUZE/BANDAGES/DRESSINGS) ×1 IMPLANT
BRUSH SCRUB EZ PLAIN DRY (MISCELLANEOUS) ×2 IMPLANT
CORDS BIPOLAR (ELECTRODE) IMPLANT
COVER MAYO STAND STRL (DRAPES) ×2 IMPLANT
COVER TABLE BACK 60X90 (DRAPES) ×2 IMPLANT
CUFF TOURNIQUET SINGLE 18IN (TOURNIQUET CUFF) ×1 IMPLANT
DECANTER SPIKE VIAL GLASS SM (MISCELLANEOUS) IMPLANT
DRAPE EXTREMITY T 121X128X90 (DRAPE) ×2 IMPLANT
DRAPE SURG 17X23 STRL (DRAPES) ×2 IMPLANT
GAUZE SPONGE 4X4 12PLY STRL (GAUZE/BANDAGES/DRESSINGS) ×2 IMPLANT
GLOVE BIOGEL M 7.0 STRL (GLOVE) ×1 IMPLANT
GLOVE BIOGEL M STRL SZ7.5 (GLOVE) ×1 IMPLANT
GLOVE BIOGEL PI IND STRL 7.5 (GLOVE) IMPLANT
GLOVE BIOGEL PI INDICATOR 7.5 (GLOVE) ×1
GLOVE ORTHO TXT STRL SZ7.5 (GLOVE) ×3 IMPLANT
GOWN STRL REUS W/ TWL LRG LVL3 (GOWN DISPOSABLE) ×1 IMPLANT
GOWN STRL REUS W/ TWL XL LVL3 (GOWN DISPOSABLE) ×2 IMPLANT
GOWN STRL REUS W/TWL LRG LVL3 (GOWN DISPOSABLE) ×2
GOWN STRL REUS W/TWL XL LVL3 (GOWN DISPOSABLE) ×2
NEEDLE 27GAX1X1/2 (NEEDLE) ×1 IMPLANT
PACK BASIN DAY SURGERY FS (CUSTOM PROCEDURE TRAY) ×2 IMPLANT
PADDING CAST ABS 4INX4YD NS (CAST SUPPLIES) ×1
PADDING CAST ABS COTTON 4X4 ST (CAST SUPPLIES) ×1 IMPLANT
STOCKINETTE 4X48 STRL (DRAPES) ×2 IMPLANT
STRIP CLOSURE SKIN 1/2X4 (GAUZE/BANDAGES/DRESSINGS) ×1 IMPLANT
SUT ETHILON 5 0 P 3 18 (SUTURE)
SUT NYLON ETHILON 5-0 P-3 1X18 (SUTURE) IMPLANT
SUT PROLENE 3 0 PS 2 (SUTURE) IMPLANT
SUT PROLENE 4 0 P 3 18 (SUTURE) ×1 IMPLANT
SYR 3ML 23GX1 SAFETY (SYRINGE) IMPLANT
SYRINGE CONTROL L 12CC (SYRINGE) ×2 IMPLANT
SYRINGE CONTROL LL 12CC (SYRINGE) IMPLANT
TOWEL OR 17X24 6PK STRL BLUE (TOWEL DISPOSABLE) ×2 IMPLANT
TRAY DSU PREP LF (CUSTOM PROCEDURE TRAY) ×2 IMPLANT
UNDERPAD 30X30 INCONTINENT (UNDERPADS AND DIAPERS) ×2 IMPLANT

## 2014-02-12 NOTE — Anesthesia Procedure Notes (Signed)
Procedure Name: LMA Insertion Date/Time: 02/12/2014 10:08 AM Performed by: Gar GibbonKEETON, Taylor Jorstad S Pre-anesthesia Checklist: Patient identified, Emergency Drugs available, Suction available and Patient being monitored Patient Re-evaluated:Patient Re-evaluated prior to inductionOxygen Delivery Method: Circle System Utilized Preoxygenation: Pre-oxygenation with 100% oxygen Intubation Type: IV induction Ventilation: Mask ventilation without difficulty LMA: LMA inserted LMA Size: 4.0 Number of attempts: 1 Airway Equipment and Method: bite block Placement Confirmation: positive ETCO2 Tube secured with: Tape Dental Injury: Teeth and Oropharynx as per pre-operative assessment

## 2014-02-12 NOTE — Discharge Instructions (Signed)
[facility] Instructions Hand Surgery  Wound Care: Keep your hand elevated above the level of your heart.  Do not allow it to dangle by your side.  Keep the dressing dry and do not remove it unless your doctor advises you to do so.  He will usually change it at the time of your post-op visit.  Moving your fingers is advised to stimulate circulation but will depend on the site of your surgery.  If you have a splint applied, your doctor will advise you regarding movement.  Activity: Do not drive or operate machinery today.  Rest today and then you may return to your normal activity and work as indicated by your physician.  Diet:  Drink liquids today or eat a light diet.  You may resume a regular diet tomorrow.    General expectations: Pain for two to three days. Fingers may become slightly swollen.  Call your doctor if any of the following occur: Severe pain not relieved by pain medication. Elevated temperature. Dressing soaked with blood. Inability to move fingers. White or bluish color to fingers.  Elevate hand above heart for 3 days.  Range of motion: i.e. make a fist repeatedly is good to do post operatively.   Post Anesthesia Home Care Instructions  Activity: Get plenty of rest for the remainder of the day. A responsible adult should stay with you for 24 hours following the procedure.  For the next 24 hours, DO NOT: -Drive a car -Advertising copywriterperate machinery -Drink alcoholic beverages -Take any medication unless instructed by your physician -Make any legal decisions or sign important papers.  Meals: Start with liquid foods such as gelatin or soup. Progress to regular foods as tolerated. Avoid greasy, spicy, heavy foods. If nausea and/or vomiting occur, drink only clear liquids until the nausea and/or vomiting subsides. Call your physician if vomiting continues.  Special Instructions/Symptoms: Your throat may feel dry or sore from the anesthesia or the breathing tube placed in your  throat during surgery. If this causes discomfort, gargle with warm salt water. The discomfort should disappear within 24 hours.

## 2014-02-12 NOTE — Brief Op Note (Signed)
02/12/2014  10:40 AM  PATIENT:  Taylor Stanley  58 y.o. female  PRE-OPERATIVE DIAGNOSIS:  right index trigger and right long finger fibromatosis palmar fascia  POST-OPERATIVE DIAGNOSIS:  right index trigger and right long finger fibromatosis palmar fascia  PROCEDURE:  Procedure(s): RELEASE TRIGGER FINGER/A-1 PULLEY RIGHT INDEX AND RIGHT LONG FINGERS (Right)  SURGEON:  Surgeon(s) and Role:    * Wyn Forsterobert Adam Demary Jr V, MD - Primary  PHYSICIAN ASSISTANT:   ASSISTANTS: nurse   ANESTHESIA:   general  EBL:  Total I/O In: 500 [I.V.:500] Out: -   BLOOD ADMINISTERED:none  DRAINS: none   LOCAL MEDICATIONS USED:  XYLOCAINE   SPECIMEN:  No Specimen  DISPOSITION OF SPECIMEN:  N/A  COUNTS:  YES  TOURNIQUET:   Total Tourniquet Time Documented: Upper Arm (Right) - 17 minutes Total: Upper Arm (Right) - 17 minutes   DICTATION: .Other Dictation: Dictation Number [id number]  PLAN OF CARE: Discharge to home after PACU  PATIENT DISPOSITION:  PACU - hemodynamically stable.   Delay start of Pharmacological VTE agent (>24hrs) due to surgical blood loss or risk of bleeding: not applicable

## 2014-02-12 NOTE — Anesthesia Postprocedure Evaluation (Signed)
Anesthesia Post Note  Patient: Taylor Stanley  Procedure(s) Performed: Procedure(s) (LRB): RELEASE TRIGGER FINGER/A-1 PULLEY RIGHT INDEX AND RIGHT LONG FINGERS (Right)  Anesthesia type: General  Patient location: PACU  Post pain: Pain level controlled  Post assessment: Patient's Cardiovascular Status Stable  Last Vitals:  Filed Vitals:   02/12/14 1130  BP: 146/84  Pulse: 59  Temp:   Resp: 10    Post vital signs: Reviewed and stable  Level of consciousness: alert  Complications: No apparent anesthesia complications

## 2014-02-12 NOTE — Op Note (Signed)
[id number] 

## 2014-02-12 NOTE — Transfer of Care (Signed)
Immediate Anesthesia Transfer of Care Note  Patient: Taylor Stanley  Procedure(s) Performed: Procedure(s): RELEASE TRIGGER FINGER/A-1 PULLEY RIGHT INDEX AND RIGHT LONG FINGERS (Right)  Patient Location: PACU  Anesthesia Type:General  Level of Consciousness: awake, sedated and patient cooperative  Airway & Oxygen Therapy: Patient Spontanous Breathing and Patient connected to face mask oxygen  Post-op Assessment: Report given to PACU RN and Post -op Vital signs reviewed and stable  Post vital signs: Reviewed and stable  Complications: No apparent anesthesia complications

## 2014-02-12 NOTE — Anesthesia Preprocedure Evaluation (Addendum)
Anesthesia Evaluation  Patient identified by MRN, date of birth, ID band Patient awake    Reviewed: Allergy & Precautions, H&P , NPO status , Patient's Chart, lab work & pertinent test results, reviewed documented beta blocker date and time   History of Anesthesia Complications (+) PONV and history of anesthetic complications  Airway Mallampati: II TM Distance: >3 FB Neck ROM: full    Dental  (+) Teeth Intact, Dental Advisory Given   Pulmonary asthma , former smoker,  breath sounds clear to auscultation        Cardiovascular hypertension, On Medications + Peripheral Vascular Disease Rhythm:regular Rate:Normal     Neuro/Psych Seizures -,  PSYCHIATRIC DISORDERS  Neuromuscular disease    GI/Hepatic Neg liver ROS, GERD-  Medicated and Controlled,  Endo/Other  negative endocrine ROS  Renal/GU negative Renal ROS  negative genitourinary   Musculoskeletal   Abdominal   Peds  Hematology negative hematology ROS (+)   Anesthesia Other Findings See surgeon's H&P   Reproductive/Obstetrics negative OB ROS                          Anesthesia Physical Anesthesia Plan  ASA: III  Anesthesia Plan: General   Post-op Pain Management:    Induction: Intravenous  Airway Management Planned: LMA  Additional Equipment:   Intra-op Plan:   Post-operative Plan:   Informed Consent: I have reviewed the patients History and Physical, chart, labs and discussed the procedure including the risks, benefits and alternatives for the proposed anesthesia with the patient or authorized representative who has indicated his/her understanding and acceptance.   Dental Advisory Given  Plan Discussed with: CRNA and Surgeon  Anesthesia Plan Comments:         Anesthesia Quick Evaluation

## 2014-02-13 ENCOUNTER — Encounter (HOSPITAL_BASED_OUTPATIENT_CLINIC_OR_DEPARTMENT_OTHER): Payer: Self-pay | Admitting: Orthopedic Surgery

## 2014-02-16 NOTE — Op Note (Signed)
NAME:  Taylor, Stanley NO.:  [id number]  MEDICAL RECORD NO.:  [id number]  LOCATION:                               FACILITY:  [facility]  PHYSICIAN:  Taylor Stanley, M.D. DATE OF BIRTH:  Apr 07, 1956  DATE OF PROCEDURE:  02/12/2014 DATE OF DISCHARGE:  02/12/2014                              OPERATIVE REPORT   PREOPERATIVE DIAGNOSES: 1. Chronic stenosing tenosynovitis of right index finger with     inability to flex finger beyond approximately 45 degrees of total active motion. 2. Chronic stenosing synovitis, right long finger with locking at A-1 pulley.  POSTOPERATIVE DIAGNOSES: 1. Chronic stenosing tenosynovitis of right index finger with     inability to flex finger beyond approximately 45 degrees of total active motion. 2. Chronic stenosing synovitis, right long finger with locking at A-1 pulley with identification perioperatively of significant palmar fibromatosis which is likely part of the etiology of trigger finger presentation, also significant synovitis, index flexor digitorum superficialis and profundus tendons.  OPERATION: 1. Release of right index finger A-1 pulley with incidental resection     of palmar fibromatosis and synovectomy of flexor digitorum     superficialis and profundus tendons. 2. Release of A-1 pulley of right long finger with synovectomy.       There was a significant palmar fibromatosis noted that was     incidentally resected.  OPERATING SURGEON:  Taylor Fitch. Marceil Welp, MD  ASSISTANT:  Nurse.  Taylor Stanley:  General by LMA.  SUPERVISING ANESTHESIOLOGIST:  Taylor Maple, MD  INDICATIONS:  Taylor Stanley is a well-known patient who had presented for evaluation of chronic stenosing tenosynovitis.  Careful examination of her hand also revealed coexisting palmar fibromatosis that was early in it's presentation.  She had thickening of the subdermal fascia overlying the A-1 pulleys of the index, long, and ring fingers.  She had marked  stiffness of her index finger due to chronic stenosing tenosynovitis, inability to flex beyond a total active motion of 45 degrees.  The long finger was notable for locking.  She had similar symptoms in her right thumb and left thumb.  Both thumbs responded to preoperative steroid injection.  Due to a failure to respond to nonoperative measures, she was brought to the operating room at this time anticipating limited fasciectomy and release of the A-1 pulleys of the right index and long fingers.  DESCRIPTION OF PROCEDURE:  Taylor Stanley was evaluated in the holding area and her proper surgical sites were identified and marked with a marking pen per protocol.  Taylor Stanley was interviewed by Taylor Stanley, Taylor Stanley. General Taylor Stanley by LMA technique was recommended and accepted.  She was subsequently transferred to room 3 at the [facility], placed in supine position on the operating table.  Under Taylor Stanley direct supervision, general Taylor Stanley by LMA technique was induced.  The right hand and arm were then prepped with Betadine soap and solution, sterilely draped.  A pneumatic tourniquet was applied to the proximal right brachium.  Following exsanguination of the right arm with an Esmarch bandage, the arterial tourniquet on the proximal brachium was inflated to 220 mmHg.  A routine surgical time-out was accomplished.  The right  hand was secured in a lead hand with the fingers fully extended.  Oblique incisions were fashioned directly over the palpably thickened A1 pulleys of the right index and long fingers paralleling the distal palmar crease.  Subcutaneous tissues were carefully divided taking care to identify very significant palmar fibromatosis forming in the pretendinous fibers of the palmar fascia.  The pathologic fascia was excised exposing the A-1 pulley, in the index finger, and a small A-0 pulley.  The A-1 and A-0 pulleys were released along the radial border  to prevent bowstring of the tendons.  The flexor tendons were identified and delivered with a Ragnell retractor.  There was abundant synovitis essentially adhering the index superficialis and profundus tendons.  This was meticulously removed with a micro rongeur and scissor dissection. Likewise in the long finger, there was significant thickening of the palmar fascia.  Multiple bands of fascia between the dermis and the A1 pulley were released with scissors followed by segmental resection of the pretendinous fibers.  The A1 pulley was isolated and split with scalpel and scissors and the flexor tendons delivered.  Limited synovectomy was accomplished.  Thereafter, free range of motion of both fingers was recovered with full passive motion of the index and long fingers.  The wounds were inspected for bleeding points, subsequently repaired with intradermal 4-0 Prolene and Steri-Strips.  A 2% lidocaine was infiltrated for postoperative analgesia.  The hand was then dressed with Steri-Strips, sterile gauze, and a Coban dressing.  For aftercare, Taylor Stanley was advised to elevate her hand above her heart for the next 3 days.  She will work on immediate range of motion exercises.  She is provided Percocet 5 mg 1 p.o. q.4-6 hours p.r.n. pain, 20 tabs without refill.  We will see her back for followup in the office in a week for suture removal.  She will contact our office sooner, as needed for any problems with her dressing, fever, or other unexpected consequences of surgery.     Taylor Stanley, M.D.     RVS/MEDQ  D:  02/12/2014  T:  02/12/2014  Job:  [id number]

## 2014-04-21 ENCOUNTER — Other Ambulatory Visit: Payer: Self-pay | Admitting: Obstetrics and Gynecology

## 2014-04-22 LAB — CYTOLOGY - PAP

## 2014-10-04 ENCOUNTER — Other Ambulatory Visit: Payer: Self-pay | Admitting: Family Medicine

## 2015-04-27 ENCOUNTER — Other Ambulatory Visit: Payer: Self-pay | Admitting: Obstetrics and Gynecology

## 2015-04-28 ENCOUNTER — Other Ambulatory Visit: Payer: Self-pay | Admitting: Orthopedic Surgery

## 2015-04-28 DIAGNOSIS — M545 Low back pain: Secondary | ICD-10-CM

## 2015-04-28 DIAGNOSIS — M79604 Pain in right leg: Secondary | ICD-10-CM

## 2015-04-30 ENCOUNTER — Other Ambulatory Visit: Payer: Self-pay | Admitting: Obstetrics and Gynecology

## 2015-04-30 DIAGNOSIS — R928 Other abnormal and inconclusive findings on diagnostic imaging of breast: Secondary | ICD-10-CM

## 2015-05-04 LAB — CYTOLOGY - PAP

## 2015-05-13 ENCOUNTER — Ambulatory Visit
Admission: RE | Admit: 2015-05-13 | Discharge: 2015-05-13 | Disposition: A | Discharge status: Home / Self Care | Payer: BLUE CROSS/BLUE SHIELD | Source: Ambulatory Visit | Attending: Obstetrics and Gynecology | Admitting: Obstetrics and Gynecology

## 2015-05-13 DIAGNOSIS — R928 Other abnormal and inconclusive findings on diagnostic imaging of breast: Secondary | ICD-10-CM

## 2015-05-18 ENCOUNTER — Ambulatory Visit
Admission: RE | Admit: 2015-05-18 | Discharge: 2015-05-18 | Disposition: A | Discharge status: Home / Self Care | Payer: BLUE CROSS/BLUE SHIELD | Source: Ambulatory Visit | Attending: Orthopedic Surgery | Admitting: Orthopedic Surgery

## 2015-05-18 DIAGNOSIS — M545 Low back pain: Secondary | ICD-10-CM

## 2015-05-18 DIAGNOSIS — M79604 Pain in right leg: Secondary | ICD-10-CM

## 2015-06-30 ENCOUNTER — Ambulatory Visit (INDEPENDENT_AMBULATORY_CARE_PROVIDER_SITE_OTHER): Payer: BLUE CROSS/BLUE SHIELD | Admitting: Allergy and Immunology

## 2015-06-30 ENCOUNTER — Encounter: Payer: Self-pay | Admitting: Allergy and Immunology

## 2015-06-30 VITALS — BP 126/90 | HR 76 | Resp 16

## 2015-06-30 DIAGNOSIS — J4530 Mild persistent asthma, uncomplicated: Secondary | ICD-10-CM

## 2015-06-30 DIAGNOSIS — Z23 Encounter for immunization: Secondary | ICD-10-CM

## 2015-06-30 DIAGNOSIS — K219 Gastro-esophageal reflux disease without esophagitis: Secondary | ICD-10-CM | POA: Insufficient documentation

## 2015-06-30 DIAGNOSIS — J387 Other diseases of larynx: Secondary | ICD-10-CM

## 2015-06-30 DIAGNOSIS — J309 Allergic rhinitis, unspecified: Secondary | ICD-10-CM | POA: Diagnosis not present

## 2015-06-30 DIAGNOSIS — H1010 Acute atopic conjunctivitis, unspecified eye: Secondary | ICD-10-CM

## 2015-06-30 MED ORDER — ALBUTEROL SULFATE HFA 108 (90 BASE) MCG/ACT IN AERS: 2.0000 | INHALATION_SPRAY | RESPIRATORY_TRACT | Status: DC | PRN

## 2015-06-30 MED ORDER — MOMETASONE FUROATE 220 MCG/INH IN AEPB: 2.0000 | INHALATION_SPRAY | Freq: Every day | RESPIRATORY_TRACT | Status: DC

## 2015-06-30 MED ORDER — INFLUENZA VAC SPLIT QUAD 0.5 ML IM SUSY: 0.5000 mL | PREFILLED_SYRINGE | INTRAMUSCULAR | Status: DC

## 2015-06-30 MED ORDER — DESLORATADINE 5 MG PO TABS: 5.0000 mg | ORAL_TABLET | Freq: Every day | ORAL | Status: DC

## 2015-06-30 MED ORDER — ESOMEPRAZOLE MAGNESIUM 40 MG PO CPDR: 40.0000 mg | DELAYED_RELEASE_CAPSULE | Freq: Every day | ORAL | Status: DC

## 2015-06-30 NOTE — Progress Notes (Signed)
[facility] [place]  Follow-up Note  Refering Provider: Dianne Dun, MD Primary Provider: Ruthe Mannan, MD  Subjective:   Taylor Stanley is a 59 y.o. female who [facility] in re-evaluation of the following:  HPI Comments:  Joscelynn returns to this clinic on 06/30/2015 in reevaluation of her asthma, allergic rhinoconjunctivitis, and LPR. It is been over 1 year since I've seen her in his clinic and she is done very well with consistently using her Asmanex 220 every day and Nexium 40 mg daily. On occasion she'll use Nasonex. She is not had an exacerbation of her asthma requiring systemic steroids, and she rarely uses any short acting bronchodilator. She can't really exercise any large degree as she is developed rheumatoid arthritis that initially was treated with methotrexate unsuccessfully and she scheduled to start Remicade in 2 weeks. Her nose is not really been causing her much of a problem. Her reflux is under very good control as long she uses medicines. If she misses 1 or 2 doses she'll develop classic reflux symptoms and throat issues. She has not received the flu vaccine yet.   Outpatient Encounter Prescriptions as of 06/30/2015  Medication Sig  . aspirin 81 MG tablet Take 81 mg by mouth daily.  Marland Kitchen BENICAR HCT 40-12.5 MG per tablet TAKE 1 TABLET DAILY  . cholecalciferol (VITAMIN D) 1000 UNITS tablet Take 1,000 Units by mouth daily.  Marland Kitchen desloratadine (CLARINEX) 5 MG tablet Take 1 tablet (5 mg total) by mouth daily.  Marland Kitchen esomeprazole (NEXIUM) 40 MG capsule Take 1 capsule (40 mg total) by mouth daily. AM  . mometasone (ASMANEX 60 METERED DOSES) 220 MCG/INH inhaler Inhale 2 puffs into the lungs daily.  . mometasone (NASONEX) 50 MCG/ACT nasal spray Place 2 sprays into the nose daily.   . sertraline (ZOLOFT) 100 MG tablet 100 mg daily. Take one half daily  . [DISCONTINUED] desloratadine (CLARINEX) 5 MG tablet Take  5 mg by mouth daily.    . [DISCONTINUED] esomeprazole (NEXIUM) 40 MG capsule Take 40 mg by mouth daily. AM  . [DISCONTINUED] mometasone (ASMANEX 60 METERED DOSES) 220 MCG/INH inhaler Inhale 2 puffs into the lungs daily.    Marland Kitchen albuterol (PROVENTIL HFA) 108 (90 BASE) MCG/ACT inhaler Inhale 2 puffs into the lungs every 4 (four) hours as needed for wheezing or shortness of breath.  . calcium carbonate (OS-CAL) 600 MG TABS Take 600 mg by mouth 2 (two) times daily with a meal.  . oxyCODONE-acetaminophen (PERCOCET/ROXICET) 5-325 MG per tablet Take one or two tablets every 4 to 6 hours as needed for pain (Patient not taking: Reported on 06/30/2015)  . vitamin E 100 UNIT capsule Take 100 Units by mouth daily.   Facility-Administered Encounter Medications as of 06/30/2015  Medication  . [START ON 07/01/2015] Influenza vac split quadrivalent PF (FLUARIX) injection 0.5 mL    Meds ordered this encounter  Medications  . Influenza vac split quadrivalent PF (FLUARIX) injection 0.5 mL    Sig:   . esomeprazole (NEXIUM) 40 MG capsule    Sig: Take 1 capsule (40 mg total) by mouth daily. AM    Dispense:  90 capsule    Refill:  3  . mometasone (ASMANEX 60 METERED DOSES) 220 MCG/INH inhaler    Sig: Inhale 2 puffs into the lungs daily.    Dispense:  3 Inhaler    Refill:  3  . desloratadine (CLARINEX) 5 MG tablet  Sig: Take 1 tablet (5 mg total) by mouth daily.    Dispense:  90 tablet    Refill:  3  . albuterol (PROVENTIL HFA) 108 (90 BASE) MCG/ACT inhaler    Sig: Inhale 2 puffs into the lungs every 4 (four) hours as needed for wheezing or shortness of breath.    Dispense:  1 Inhaler    Refill:  2    Past Medical History  Diagnosis Date  . Hx of colonic polyps   . GERD (gastroesophageal reflux disease)   . Osteopenia   . PONV (postoperative nausea and vomiting)   . TMJ disease   . Dental crowns present   . Seasonal allergies   . Carpal tunnel syndrome of right wrist 11/2011  . Asthma      triggered by allergies; does not use inhaler daily  . Hypertension     under control; has been on med. x 5 yrs.  . Snores     denies apnea  . Anxiety   . Depression   . Seizure (HCC) as an infant    x 1 - unknown cause  . Wears glasses     Past Surgical History  Procedure Laterality Date  . Cesarean section  10-94    BTL  . Lumbar disc surgery  early 1990's  . Carpal tunnel release  05/08/2007    left  . Tubal ligation  1994  . Tonsillectomy and adenoidectomy  as a child  . Wisdom tooth extraction    . Cholecystectomy  07/07/2005    lap. chole.  . Esophageal dilation  05/15/2003  . Cervical disc surgery  2011  . Carpal tunnel release  12/12/2011    Procedure: CARPAL TUNNEL RELEASE;  Surgeon: Wyn Forsterobert V Sypher Jr., MD;  Location: [facility];  Service: Orthopedics;  Laterality: Right;  . Spine surgery    . Trigger finger release Right 02/12/2014    Procedure: RELEASE TRIGGER FINGER/A-1 PULLEY RIGHT INDEX AND RIGHT LONG FINGERS;  Surgeon: Wyn Forsterobert Sypher Jr V, MD;  Location: [facility];  Service: Orthopedics;  Laterality: Right;    Allergies  Allergen Reactions  . Penicillins Hives and Swelling    Review of Systems  Constitutional: Negative for fever, chills and fatigue.  HENT: Negative for congestion, ear pain, facial swelling, hearing loss, nosebleeds, postnasal drip, rhinorrhea, sinus pressure, sneezing, sore throat, tinnitus, trouble swallowing and voice change.   Eyes: Negative for pain, discharge, redness and itching.  Respiratory: Negative for cough, chest tightness, shortness of breath and wheezing.   Cardiovascular: Negative for chest pain and leg swelling.  Gastrointestinal: Negative for nausea, vomiting and abdominal pain.  Endocrine: Negative for cold intolerance and heat intolerance.  Musculoskeletal: Positive for arthralgias (Rheumatoid arthritis involving hands and feet). Negative for myalgias.  Skin: Negative for rash.   Allergic/Immunologic: Negative.   Neurological: Negative for dizziness and headaches.  Hematological: Negative for adenopathy.     Objective:   Filed Vitals:   06/30/15 0907  BP: 126/90  Pulse: 76  Resp: 16          Physical Exam  Constitutional: She appears well-developed and well-nourished. No distress.  HENT:  Head: Normocephalic and atraumatic. Head is without right periorbital erythema and without left periorbital erythema.  Right Ear: Tympanic membrane, external ear and ear canal normal. No drainage or tenderness. No foreign bodies. Tympanic membrane is not injected, not scarred, not perforated, not erythematous, not retracted and not bulging. No middle ear effusion.  Left Ear:  Tympanic membrane, external ear and ear canal normal. No drainage or tenderness. No foreign bodies. Tympanic membrane is not injected, not scarred, not perforated, not erythematous, not retracted and not bulging.  No middle ear effusion.  Nose: Nose normal. No mucosal edema, rhinorrhea, nose lacerations or sinus tenderness.  No foreign bodies.  Mouth/Throat: Oropharynx is clear and moist. No oropharyngeal exudate, posterior oropharyngeal edema, posterior oropharyngeal erythema or tonsillar abscesses.  Eyes: Lids are normal. Right eye exhibits no chemosis, no discharge and no exudate. No foreign body present in the right eye. Left eye exhibits no chemosis, no discharge and no exudate. No foreign body present in the left eye. Right conjunctiva is not injected. Left conjunctiva is not injected.  Neck: Neck supple. No tracheal tenderness present. No tracheal deviation and no edema present. No thyroid mass and no thyromegaly present.  Cardiovascular: Normal rate, regular rhythm, S1 normal and S2 normal.  Exam reveals no gallop.   No murmur heard. Pulmonary/Chest: No accessory muscle usage or stridor. No respiratory distress. She has no wheezes. She has no rhonchi. She has no rales.  Abdominal: Soft.   Lymphadenopathy:       Head (right side): No tonsillar adenopathy present.       Head (left side): No tonsillar adenopathy present.    She has no cervical adenopathy.  Neurological: She is alert.  Skin: No rash noted. She is not diaphoretic.  Psychiatric: She has a normal mood and affect. Her behavior is normal.    Diagnostics:    Spirometry was performed and demonstrated an FEV1 of 1.75 at 81 % of predicted.  The patient had an Asthma Control Test with the following results: ACT Total Score: 20.    Assessment and Plan:   1. Mild persistent asthma, uncomplicated   2. Allergic rhinoconjunctivitis   3. LPRD (laryngopharyngeal reflux disease)   4. Need for influenza vaccination      1. Continue Asmanex 220 one inhalation 1 time per day. Increase to 2 inhalations twice a day during respiratory tract flare up  2. Continue Nasonex one or 2 sprays each nostril one time per day during periods of upper airway symptoms  3. Continue Nexium 40 mg daily  4. Continue Proventil and Clarinex/Claritin if needed  5. Flu vaccine today  6. Return in one year or earlier if there is a problem  Desaree is done very well with her current therapy and I see no need for changing this treatment at this point in time. We'll see her back in this clinic and possibly one year or earlier. I did have a talk with her today about Remicade and the fact that she will probably be more predisposed to the development of respiratory tract infections while using this drug. Certainly she can return to see me during the interval should there be any problems.   Laurette Schimke, MD [facility]

## 2015-06-30 NOTE — Patient Instructions (Signed)
  1. Continue Asmanex 220 one inhalation 1 time per day. Increase to 2 inhalations twice a day during respiratory tract flare up  2. Continue Nasonex one or 2 sprays each nostril one time per day during periods of upper airway symptoms  3. Continue Nexium 40 mg daily  4. Continue Proventil and Clarinex/Claritin if needed  5. Flu vaccine today  6. Return in one year or earlier if there is a problem

## 2015-08-27 ENCOUNTER — Other Ambulatory Visit: Payer: BLUE CROSS/BLUE SHIELD

## 2015-08-27 ENCOUNTER — Other Ambulatory Visit (HOSPITAL_COMMUNITY): Payer: Self-pay | Admitting: Internal Medicine

## 2015-08-27 ENCOUNTER — Other Ambulatory Visit: Payer: Self-pay | Admitting: Internal Medicine

## 2015-08-27 DIAGNOSIS — G4489 Other headache syndrome: Secondary | ICD-10-CM

## 2016-06-03 ENCOUNTER — Other Ambulatory Visit: Payer: Self-pay | Admitting: Allergy and Immunology

## 2017-01-09 ENCOUNTER — Other Ambulatory Visit: Payer: Self-pay | Admitting: Allergy and Immunology

## 2017-10-05 ENCOUNTER — Emergency Department (HOSPITAL_COMMUNITY): Payer: BLUE CROSS/BLUE SHIELD

## 2017-10-05 ENCOUNTER — Encounter (HOSPITAL_COMMUNITY): Payer: Self-pay | Admitting: Emergency Medicine

## 2017-10-05 ENCOUNTER — Emergency Department (HOSPITAL_COMMUNITY)
Admission: EM | Admit: 2017-10-05 | Discharge: 2017-10-05 | Disposition: A | Discharge status: Home / Self Care | Payer: BLUE CROSS/BLUE SHIELD | Attending: Emergency Medicine | Admitting: Emergency Medicine

## 2017-10-05 ENCOUNTER — Other Ambulatory Visit: Payer: Self-pay

## 2017-10-05 DIAGNOSIS — Z87891 Personal history of nicotine dependence: Secondary | ICD-10-CM | POA: Insufficient documentation

## 2017-10-05 DIAGNOSIS — Z7982 Long term (current) use of aspirin: Secondary | ICD-10-CM | POA: Insufficient documentation

## 2017-10-05 DIAGNOSIS — I10 Essential (primary) hypertension: Secondary | ICD-10-CM | POA: Insufficient documentation

## 2017-10-05 DIAGNOSIS — R0789 Other chest pain: Secondary | ICD-10-CM

## 2017-10-05 DIAGNOSIS — J45909 Unspecified asthma, uncomplicated: Secondary | ICD-10-CM | POA: Diagnosis not present

## 2017-10-05 DIAGNOSIS — R0602 Shortness of breath: Secondary | ICD-10-CM | POA: Insufficient documentation

## 2017-10-05 DIAGNOSIS — R079 Chest pain, unspecified: Secondary | ICD-10-CM | POA: Diagnosis present

## 2017-10-05 LAB — CBC
HEMATOCRIT: 39.1 % (ref 36.0–46.0)
Hemoglobin: 13.2 g/dL (ref 12.0–15.0)
MCH: 31.1 pg (ref 26.0–34.0)
MCHC: 33.8 g/dL (ref 30.0–36.0)
MCV: 92 fL (ref 78.0–100.0)
Platelets: 150 10*3/uL (ref 150–400)
RBC: 4.25 MIL/uL (ref 3.87–5.11)
RDW: 13.8 % (ref 11.5–15.5)
WBC: 5.4 10*3/uL (ref 4.0–10.5)

## 2017-10-05 LAB — BASIC METABOLIC PANEL
ANION GAP: 10 (ref 5–15)
BUN: 13 mg/dL (ref 6–20)
CO2: 27 mmol/L (ref 22–32)
Calcium: 9.6 mg/dL (ref 8.9–10.3)
Chloride: 102 mmol/L (ref 101–111)
Creatinine, Ser: 0.69 mg/dL (ref 0.44–1.00)
Glucose, Bld: 112 mg/dL — ABNORMAL HIGH (ref 65–99)
Potassium: 4 mmol/L (ref 3.5–5.1)
SODIUM: 139 mmol/L (ref 135–145)

## 2017-10-05 LAB — I-STAT TROPONIN, ED
TROPONIN I, POC: 0 ng/mL (ref 0.00–0.08)
Troponin i, poc: 0 ng/mL (ref 0.00–0.08)

## 2017-10-05 MED ORDER — IOPAMIDOL (ISOVUE-370) INJECTION 76%
INTRAVENOUS | Status: AC
  Administered 2017-10-05: 100 mL
  Filled 2017-10-05: qty 100

## 2017-10-05 NOTE — Discharge Instructions (Signed)
Evaluation today is reassuring, labs and EKG show no evidence of acute cardiac event, CT scan of the chest shows no evidence of blood clot, pneumonia or other concerning findings.  I am also reassured that her symptoms have been improving today.  Please follow-up with your primary care doctor early next week.  Continue to monitor your blood pressure at home, and have this rechecked with your PCP as well.  If you have worsening chest pain or shortness of breath, lightheadedness, you pass out, have severe headache or vision changes or any other new or concerning symptoms please return to the ED for reevaluation

## 2017-10-05 NOTE — ED Notes (Signed)
Pt was ambulated, pulsometer read 96 through out the walk.

## 2017-10-05 NOTE — ED Notes (Signed)
Patient verbalizes understanding of discharge instructions. Opportunity for questioning and answers were provided. Armband removed by staff, pt discharged from ED ambulatory.   

## 2017-10-05 NOTE — ED Triage Notes (Signed)
Pt to ER for evaluation of chest heaviness onset yesterday, states sudden in onset, initially began in her left shoulder blade and left neck, today reports heaviness in central chest with difficulty catching her breath. Pt a/o x4.

## 2017-10-05 NOTE — ED Provider Notes (Signed)
[facility] EMERGENCY DEPARTMENT Provider Note   CSN: [id number] Arrival date & time: 10/05/17  1315     History   Chief Complaint Chief Complaint  Patient presents with  . Chest Pain  . Shortness of Breath    HPI Taylor Stanley is a 62 y.o. female.  Taylor Stanley is a 62 y.o. Female with a history of hypertension, previous DVT not currently on anticoagulation, asthma, GERD and anxiety, who presents to the ED for evaluation of chest pain and shortness of breath that started yesterday evening.  Patient reports she noted some pain over her left shoulder blade and back that wrapped around up under her breast, well localized but felt like heaviness and sharp pain.  Patient reports pain made worse with deep breath, she does not think pain is exertional.  Pain does not radiate to the arm neck or jaw.  Reports today pain has eased considerably but she still notices it primarily with taking a deep breath.  No cough, fevers or chills.  She denies any diaphoresis, nausea or vomiting.  No abdominal pain.  She had some mild lightheadedness yesterday denies any dizziness or headache. Patient reports history of previous DVT in the left leg, not currently on any anticoagulation, denies any leg swelling or discomfort, no recent long distance travel or surgeries.  Patient denies any history of previous cardiac events, is not currently followed by cardiology, has never had stress test or heart cath.  Patient noted to be hypertensive at 196/102 in triage, reports she takes Benicar daily which she took this morning, she denies headache, vision changes, numbness, tingling or weakness in any of the extremities.      Past Medical History:  Diagnosis Date  . Anxiety   . Asthma    triggered by allergies; does not use inhaler daily  . Carpal tunnel syndrome of right wrist 11/2011  . Dental crowns present   . Depression   . GERD (gastroesophageal reflux disease)   . Hx of colonic polyps    . Hypertension    under control; has been on med. x 5 yrs.  . Osteopenia   . PONV (postoperative nausea and vomiting)   . Seasonal allergies   . Seizure (HCC) as an infant   x 1 - unknown cause  . Snores    denies apnea  . TMJ disease   . Wears glasses     Patient Active Problem List   Diagnosis Date Noted  . Mild persistent asthma 06/30/2015  . Allergic rhinoconjunctivitis 06/30/2015  . LPRD (laryngopharyngeal reflux disease) 06/30/2015  . Need for influenza vaccination 06/30/2015  . Routine general medical examination at a health care facility 06/24/2012  . Arthropathy 03/20/2011  . DEEP VENOUS THROMBOPHLEBITIS, LEG, RIGHT 10/10/2010  . CERVICAL RADICULOPATHY, RIGHT 02/24/2010  . CLIMACTERIC STATE, FEMALE 03/16/2009  . DRY EYE SYNDROME 09/15/2008  . HYPERLIPIDEMIA 09/11/2007  . HYPERTENSION, BENIGN ESSENTIAL, CONTROLLED 02/27/2007  . PANIC ATTACK 02/21/2007  . Tobacco use disorder 02/21/2007  . GERD 02/21/2007  . COLONIC POLYPS, HX OF 02/21/2007    Past Surgical History:  Procedure Laterality Date  . CARPAL TUNNEL RELEASE  05/08/2007   left  . CARPAL TUNNEL RELEASE  12/12/2011   Procedure: CARPAL TUNNEL RELEASE;  Surgeon: Wyn Forster., MD;  Location:  SURGERY CENTER;  Service: Orthopedics;  Laterality: Right;  . CERVICAL DISC SURGERY  2011  . CESAREAN SECTION  10-94   BTL  . CHOLECYSTECTOMY  07/07/2005  lap. chole.  . ESOPHAGEAL DILATION  05/15/2003  . LUMBAR DISC SURGERY  early 1990's  . SPINE SURGERY    . TONSILLECTOMY AND ADENOIDECTOMY  as a child  . TRIGGER FINGER RELEASE Right 02/12/2014   Procedure: RELEASE TRIGGER FINGER/A-1 PULLEY RIGHT INDEX AND RIGHT LONG FINGERS;  Surgeon: Wyn Forster, MD;  Location: [facility];  Service: Orthopedics;  Laterality: Right;  . TUBAL LIGATION  1994  . WISDOM TOOTH EXTRACTION      OB History    No data available       Home Medications    Prior to Admission medications     Medication Sig Start Date End Date Taking? Authorizing Provider  albuterol (PROVENTIL HFA) 108 (90 BASE) MCG/ACT inhaler Inhale 2 puffs into the lungs every 4 (four) hours as needed for wheezing or shortness of breath. 06/30/15   Kozlow, Alvira Philips, MD  aspirin 81 MG tablet Take 81 mg by mouth daily.    [provider]  BENICAR HCT 40-12.5 MG per tablet TAKE 1 TABLET DAILY    Dianne Dun, MD  calcium carbonate (OS-CAL) 600 MG TABS Take 600 mg by mouth 2 (two) times daily with a meal.    [provider]  cholecalciferol (VITAMIN D) 1000 UNITS tablet Take 1,000 Units by mouth daily.    [provider]  desloratadine (CLARINEX) 5 MG tablet Take 1 tablet (5 mg total) by mouth daily. 06/30/15   Kozlow, Alvira Philips, MD  esomeprazole (NEXIUM) 40 MG capsule Take 1 capsule (40 mg total) by mouth daily. AM 06/30/15   Kozlow, Alvira Philips, MD  mometasone (ASMANEX 60 METERED DOSES) 220 MCG/INH inhaler Inhale 2 puffs into the lungs daily. 06/30/15   Kozlow, Alvira Philips, MD  mometasone (NASONEX) 50 MCG/ACT nasal spray Place 2 sprays into the nose daily.     [provider]  oxyCODONE-acetaminophen (PERCOCET/ROXICET) 5-325 MG per tablet Take one or two tablets every 4 to 6 hours as needed for pain Patient not taking: Reported on 06/30/2015 02/12/14   Sypher, Molly Maduro, MD  sertraline (ZOLOFT) 100 MG tablet 100 mg daily. Take one half daily    [provider]  vitamin E 100 UNIT capsule Take 100 Units by mouth daily.    [provider]    Family History Family History  Problem Relation Age of Onset  . Colon cancer Mother   . Obesity Mother   . Diabetes Mother   . Deep vein thrombosis Mother   . Hypertension Mother        pacemaker  . Emphysema Mother   . Thyroid disease Mother   . Hypertension Father        pacemaker  . Alzheimer's disease Father   . Hypothyroidism Father   . Diabetes Sister   . Obesity Sister   . Hypertension Sister   . COPD Sister   .  Hypothyroidism Sister   . Thyroid disease Sister   . Fibromyalgia Sister   . Bipolar disorder Sister   . Hypothyroidism Sister   . Suicidality Sister     Social History Social History   Tobacco Use  . Smoking status: Former Smoker    Last attempt to quit: 02/10/2005    Years since quitting: 12.6  . Smokeless tobacco: Never Used  Substance Use Topics  . Alcohol use: No  . Drug use: No     Allergies   Penicillins   Review of Systems Review of Systems  Constitutional: Negative  for chills and fever.  HENT: Negative for congestion, rhinorrhea and sore throat.   Eyes: Negative for visual disturbance.  Respiratory: Positive for chest tightness and shortness of breath. Negative for cough and wheezing.   Cardiovascular: Positive for chest pain. Negative for palpitations and leg swelling.  Gastrointestinal: Negative for abdominal pain, diarrhea, nausea and vomiting.  Genitourinary: Negative for dysuria.  Musculoskeletal: Negative for arthralgias and myalgias.  Skin: Negative for color change, pallor and rash.  Neurological: Positive for light-headedness. Negative for dizziness, tremors, facial asymmetry, speech difficulty, weakness, numbness and headaches.     Physical Exam Updated Vital Signs BP (!) 196/102   Pulse 70   Temp 98.2 F (36.8 C) (Oral)   Resp 18   Physical Exam  Constitutional: She appears well-developed and well-nourished. No distress.  HENT:  Head: Normocephalic and atraumatic.  Mouth/Throat: Oropharynx is clear and moist.  Eyes: Right eye exhibits no discharge. Left eye exhibits no discharge.  Neck: Neck supple.  Cardiovascular: Normal rate, regular rhythm, normal heart sounds and intact distal pulses.  Pulses:      Radial pulses are 2+ on the right side, and 2+ on the left side.       Dorsalis pedis pulses are 2+ on the right side, and 2+ on the left side.  Pulmonary/Chest: Effort normal and breath sounds normal. No stridor. No respiratory distress.  She has no wheezes. She has no rales.  Respirations equal and unlabored, patient able to speak in full sentences, lungs clear to auscultation bilaterally  Abdominal: Soft. Bowel sounds are normal. She exhibits no distension and no mass. There is no tenderness. There is no guarding.  Musculoskeletal: She exhibits no edema or deformity.  No lower extremity edema or tenderness bilaterally  Neurological: She is alert. Coordination normal.  Skin: Skin is warm and dry. Capillary refill takes less than 2 seconds. She is not diaphoretic.  Psychiatric: She has a normal mood and affect. Her behavior is normal.  Nursing note and vitals reviewed.    ED Treatments / Results  Labs (all labs ordered are listed, but only abnormal results are displayed) Labs Reviewed  BASIC METABOLIC PANEL - Abnormal; Notable for the following components:      Result Value   Glucose, Bld 112 (*)    All other components within normal limits  CBC  I-STAT TROPONIN, ED  I-STAT TROPONIN, ED    EKG  EKG Interpretation  Date/Time:  Friday October 05 2017 13:23:28 EST Ventricular Rate:  72 PR Interval:  138 QRS Duration: 80 QT Interval:  404 QTC Calculation: 442 R Axis:   42 Text Interpretation:  Normal sinus rhythm Nonspecific ST and T wave abnormality Abnormal ECG No significant change since last tracing Confirmed by Richardean Canal [phone]) on 10/05/2017 2:25:12 PM       Radiology Dg Chest 2 View  Result Date: 10/05/2017 CLINICAL DATA:  Upper back and chest pain since yesterday. EXAM: CHEST - 2 VIEW COMPARISON:  CT chest 02/21/2010. FINDINGS: Lungs are clear. Heart size is normal. No pneumothorax or pleural effusion. No focal bony abnormality. IMPRESSION: Negative chest. Electronically Signed   By: Drusilla Kanner M.D.   On: 10/05/2017 13:52   Ct Angio Chest Pe W And/or Wo Contrast  Result Date: 10/05/2017 CLINICAL DATA:  Chest pain. EXAM: CT ANGIOGRAPHY CHEST WITH CONTRAST TECHNIQUE: Multidetector CT imaging of the  chest was performed using the standard protocol during bolus administration of intravenous contrast. Multiplanar CT image reconstructions and MIPs were obtained to  evaluate the vascular anatomy. CONTRAST:  ISOVUE-370 IOPAMIDOL (ISOVUE-370) INJECTION 76% COMPARISON:  CT scan of February 21, 2010. FINDINGS: Cardiovascular: Satisfactory opacification of the pulmonary arteries to the segmental level. No evidence of pulmonary embolism. Normal heart size. No pericardial effusion. Mediastinum/Nodes: No enlarged mediastinal, hilar, or axillary lymph nodes. Thyroid gland, trachea, and esophagus demonstrate no significant findings. Lungs/Pleura: Lungs are clear. No pleural effusion or pneumothorax. Upper Abdomen: No acute abnormality. Musculoskeletal: No chest wall abnormality. No acute or significant osseous findings. Review of the MIP images confirms the above findings. IMPRESSION: No definite evidence of pulmonary embolus. No acute cardiopulmonary abnormality seen. Electronically Signed   By: Lupita Raider, M.D.   On: 10/05/2017 16:08    Procedures Procedures (including critical care time)  Medications Ordered in ED Medications  iopamidol (ISOVUE-370) 76 % injection (100 mLs  Contrast Given 10/05/17 1553)     Initial Impression / Assessment and Plan / ED Course  I have reviewed the triage vital signs and the nursing notes.  Pertinent labs & imaging results that were available during my care of the patient were reviewed by me and considered in my medical decision making (see chart for details).  Patient presents to the ED for evaluation of chest pain and shortness of breath which began last night, chest pain has been improving somewhat today.  Patient initially noted to be very hypertensive in triage 196/102, all vitals otherwise normal.  Lungs clear on exam abdomen nontender heart with regular rhythm normal heart sounds.   Initial workup very reassuring, initial troponin 0, EKG without ischemic  changes, chest x-ray shows no active cardiopulmonary disease.  There is no leukocytosis and normal hemoglobin, no electrolyte derangements and normal kidney function.  Given patient's history of prior DVT and chest pain that is pleuritic in nature will plan for CT angio of the chest to rule out PE.  Chest pain does not sound typical of ACS, HEART Pathway score of 3, will get delta troponin. Story not suggestive of dissection.  CT angio shows no evidence of pulmonary embolus and no acute cold or cardiopulmonary abnormalities.  Gust these results with patient and family, her repeat delta troponin is 0.  Patient ambulated in the hallway maintained normal O2 saturations and heart rate, did not become dyspneic, dizzy or unsteady.  Patient's blood pressure has improved significantly on its own without intervention.  She reports chest discomfort and shortness of breath has continued to improve and she is feeling much better now.  Patient discussed with Dr. Silverio Lay.  At this time I feel patient is stable for discharge home with close follow-up with her primary care provider.  Strict return precautions discussed with the patient.  She is also to keep a record of her blood pressures at home this week and take these to her PCP appointment.  Patient and family expressed understanding and are in agreement with plan.  Vitals:   10/05/17 1715 10/05/17 1730 10/05/17 1745 10/05/17 1813  BP: 140/75 (!) 142/69 131/85 (!) 151/80  Pulse: (!) 56 (!) 58 (!) 58 (!) 58  Resp: 10 17 17 16   Temp:      TempSrc:      SpO2: 95% 98% 98% 98%  Weight:      Height:         Final Clinical Impressions(s) / ED Diagnoses   Final diagnoses:  Atypical chest pain  Shortness of breath    ED Discharge Orders    None  Dartha LodgeFord, Ann Groeneveld N, PA-C 10/06/17 0001    Charlynne PanderYao, David Hsienta, MD 10/08/17 [phone]

## 2018-01-19 ENCOUNTER — Ambulatory Visit (HOSPITAL_COMMUNITY)
Admission: EM | Admit: 2018-01-19 | Discharge: 2018-01-19 | Disposition: A | Discharge status: Home / Self Care | Payer: BLUE CROSS/BLUE SHIELD | Attending: Internal Medicine | Admitting: Internal Medicine

## 2018-01-19 ENCOUNTER — Other Ambulatory Visit: Payer: Self-pay

## 2018-01-19 ENCOUNTER — Encounter (HOSPITAL_COMMUNITY): Payer: Self-pay | Admitting: Emergency Medicine

## 2018-01-19 DIAGNOSIS — B029 Zoster without complications: Secondary | ICD-10-CM | POA: Diagnosis not present

## 2018-01-19 DIAGNOSIS — H5711 Ocular pain, right eye: Secondary | ICD-10-CM | POA: Diagnosis not present

## 2018-01-19 DIAGNOSIS — R51 Headache: Secondary | ICD-10-CM

## 2018-01-19 MED ORDER — KETOROLAC TROMETHAMINE 60 MG/2ML IM SOLN
INTRAMUSCULAR | Status: AC
  Filled 2018-01-19: qty 2

## 2018-01-19 MED ORDER — TETRACAINE HCL 0.5 % OP SOLN
OPHTHALMIC | Status: AC
  Filled 2018-01-19: qty 4

## 2018-01-19 MED ORDER — KETOROLAC TROMETHAMINE 60 MG/2ML IM SOLN
60.0000 mg | Freq: Once | INTRAMUSCULAR | Status: AC
  Administered 2018-01-19: 60 mg via INTRAMUSCULAR

## 2018-01-19 MED ORDER — VALACYCLOVIR HCL 1 G PO TABS: 1000.0000 mg | ORAL_TABLET | Freq: Three times a day (TID) | ORAL | 0 refills | Status: AC

## 2018-01-19 NOTE — Discharge Instructions (Addendum)
Start valtrex as directed. As discussed, worries for shingles that can go to the eye. If experiencing worsening symptoms, eye redness, worsening eye irritation, rash to the nose, go to the emergency department for further evaluation. Otherwise, follow up with Dr Charlotte SanesMcCuen on Monday 8:30am for further evaluation needed.

## 2018-01-19 NOTE — ED Triage Notes (Signed)
[facility] a [facility] headache, neck pain and right eye pain x 4 days. The patient reported being seen by her PCP and prescribed several medications.

## 2018-01-19 NOTE — ED Provider Notes (Signed)
[facility]    CSN: [id number] Arrival date & time: 6/22/16109604519  1220     History   Chief Complaint Chief Complaint  Patient presents with  . Headache    HPI Taylor Stanley is a 62 y.o. female.   62 year old female with history of anxiety, asthma, carpal tunnel, depression, GERD, hypertension comes in for 6-day history of right neck, head, eye pain.  States 4 days ago she saw an provider, and was given prednisone, Bactrim, erythromycin ointment.  Since then, has had worsening pain.  States eyelid swelling with erythema, that has worsened.  Denies drainage, crusting.  Does have eye redness, generalized blurry vision with photophobia.  Pain with movement that has worsened. She has right-sided headache that is sharp//pressure feeling, better with cold compress.  No obvious aggravating features.  Denies phonophobia.  Denies nausea, vomiting.  Chills without fever.  She has been taking medications as directed without relief.  She has a rash spreading up to her eyelash, and to her forehead and head.     Past Medical History:  Diagnosis Date  . Anxiety   . Asthma    triggered by allergies; does not use inhaler daily  . Carpal tunnel syndrome of right wrist 11/2011  . Dental crowns present   . Depression   . GERD (gastroesophageal reflux disease)   . Hx of colonic polyps   . Hypertension    under control; has been on med. x 5 yrs.  . Osteopenia   . PONV (postoperative nausea and vomiting)   . Seasonal allergies   . Seizure (HCC) as an infant   x 1 - unknown cause  . Snores    denies apnea  . TMJ disease   . Wears glasses     Patient Active Problem List   Diagnosis Date Noted  . Mild persistent asthma 06/30/2015  . Allergic rhinoconjunctivitis 06/30/2015  . LPRD (laryngopharyngeal reflux disease) 06/30/2015  . Need for influenza vaccination 06/30/2015  . Routine general medical examination at a health care facility 06/24/2012  . Arthropathy 03/20/2011  . DEEP  VENOUS THROMBOPHLEBITIS, LEG, RIGHT 10/10/2010  . CERVICAL RADICULOPATHY, RIGHT 02/24/2010  . CLIMACTERIC STATE, FEMALE 03/16/2009  . DRY EYE SYNDROME 09/15/2008  . HYPERLIPIDEMIA 09/11/2007  . HYPERTENSION, BENIGN ESSENTIAL, CONTROLLED 02/27/2007  . PANIC ATTACK 02/21/2007  . Tobacco use disorder 02/21/2007  . GERD 02/21/2007  . COLONIC POLYPS, HX OF 02/21/2007    Past Surgical History:  Procedure Laterality Date  . CARPAL TUNNEL RELEASE  05/08/2007   left  . CARPAL TUNNEL RELEASE  12/12/2011   Procedure: CARPAL TUNNEL RELEASE;  Surgeon: Wyn Forsterobert V Sypher Jr., MD;  Location: [facility];  Service: Orthopedics;  Laterality: Right;  . CERVICAL DISC SURGERY  2011  . CESAREAN SECTION  10-94   BTL  . CHOLECYSTECTOMY  07/07/2005   lap. chole.  . ESOPHAGEAL DILATION  05/15/2003  . LUMBAR DISC SURGERY  early 1990's  . SPINE SURGERY    . TONSILLECTOMY AND ADENOIDECTOMY  as a child  . TRIGGER FINGER RELEASE Right 02/12/2014   Procedure: RELEASE TRIGGER FINGER/A-1 PULLEY RIGHT INDEX AND RIGHT LONG FINGERS;  Surgeon: Wyn Forsterobert Sypher Jr V, MD;  Location: [facility];  Service: Orthopedics;  Laterality: Right;  . TUBAL LIGATION  1994  . WISDOM TOOTH EXTRACTION      OB History   None      Home Medications    Prior to Admission medications   Medication Sig Start  Date End Date Taking? Authorizing Provider  erythromycin ophthalmic ointment 1 application at bedtime.   Yes [provider]  predniSONE (DELTASONE) 5 MG tablet Take 5 mg by mouth daily with breakfast.   Yes [provider]  sulfamethoxazole-trimethoprim (BACTRIM DS,SEPTRA DS) 800-160 MG tablet Take 1 tablet by mouth 2 (two) times daily.   Yes [provider]  albuterol (PROVENTIL HFA) 108 (90 BASE) MCG/ACT inhaler Inhale 2 puffs into the lungs every 4 (four) hours as needed for wheezing or shortness of breath. 06/30/15   Kozlow, Alvira Philips, MD  aspirin 81 MG tablet Take 81 mg by  mouth daily.    [provider]  BENICAR HCT 40-12.5 MG per tablet TAKE 1 TABLET DAILY    Dianne Dun, MD  calcium carbonate (OS-CAL) 600 MG TABS Take 600 mg by mouth 2 (two) times daily with a meal.    [provider]  cholecalciferol (VITAMIN D) 1000 UNITS tablet Take 1,000 Units by mouth daily.    [provider]  desloratadine (CLARINEX) 5 MG tablet Take 1 tablet (5 mg total) by mouth daily. 06/30/15   Kozlow, Alvira Philips, MD  esomeprazole (NEXIUM) 40 MG capsule Take 1 capsule (40 mg total) by mouth daily. AM 06/30/15   Kozlow, Alvira Philips, MD  mometasone (ASMANEX 60 METERED DOSES) 220 MCG/INH inhaler Inhale 2 puffs into the lungs daily. 06/30/15   Kozlow, Alvira Philips, MD  mometasone (NASONEX) 50 MCG/ACT nasal spray Place 2 sprays into the nose daily.     [provider]  oxyCODONE-acetaminophen (PERCOCET/ROXICET) 5-325 MG per tablet Take one or two tablets every 4 to 6 hours as needed for pain Patient not taking: Reported on 06/30/2015 02/12/14   Sypher, Molly Maduro, MD  sertraline (ZOLOFT) 100 MG tablet 100 mg daily. Take one half daily    [provider]  valACYclovir (VALTREX) 1000 MG tablet Take 1 tablet (1,000 mg total) by mouth 3 (three) times daily for 7 days. 01/19/18 01/26/18  Cathie Hoops, Navi Erber V, PA-C  vitamin E 100 UNIT capsule Take 100 Units by mouth daily.    [provider]    Family History Family History  Problem Relation Age of Onset  . Colon cancer Mother   . Obesity Mother   . Diabetes Mother   . Deep vein thrombosis Mother   . Hypertension Mother        pacemaker  . Emphysema Mother   . Thyroid disease Mother   . Hypertension Father        pacemaker  . Alzheimer's disease Father   . Hypothyroidism Father   . Diabetes Sister   . Obesity Sister   . Hypertension Sister   . COPD Sister   . Hypothyroidism Sister   . Thyroid disease Sister   . Fibromyalgia Sister   . Bipolar disorder Sister   . Hypothyroidism Sister   . Suicidality  Sister     Social History Social History   Tobacco Use  . Smoking status: Former Smoker    Last attempt to quit: 02/10/2005    Years since quitting: 12.9  . Smokeless tobacco: Never Used  Substance Use Topics  . Alcohol use: No  . Drug use: No     Allergies   Penicillins   Review of Systems Review of Systems  Reason unable to perform ROS: See HPI as above.     Physical Exam Triage Vital Signs ED Triage Vitals  Enc Vitals Group     BP 01/19/18 1248  121/76     Pulse Rate 01/19/18 1248 71     Resp 01/19/18 1248 16     Temp 01/19/18 1248 98.5 F (36.9 C)     Temp Source 01/19/18 1248 Oral     SpO2 01/19/18 1248 98 %     Weight --      Height --      Head Circumference --      Peak Flow --      Pain Score 01/19/18 1251 10     Pain Loc --      Pain Edu? --      Excl. in GC? --    No data found.  Updated Vital Signs BP 121/76 (BP Location: Left Arm)   Pulse 71   Temp 98.5 F (36.9 C) (Oral)   Resp 16   SpO2 98%   Visual Acuity Right Eye Distance: 20/50 Left Eye Distance: 20/50 Bilateral Distance: 20/30  Right Eye Near:   Left Eye Near:    Bilateral Near:     Physical Exam  Constitutional: She is oriented to person, place, and time. She appears well-developed and well-nourished.  Non-toxic appearance. She does not appear ill. No distress.  HENT:  Head: Normocephalic and atraumatic.  Eyes: Pupils are equal, round, and reactive to light. EOM are normal. Right conjunctiva is not injected. Left conjunctiva is not injected.  See picture below, no increased warmth, mild tenderness to palpation.   IOP (Tonopen) 19 19 19   Fluorescein stain without uptake.  Neurological: She is alert and oriented to person, place, and time.          UC Treatments / Results  Labs (all labs ordered are listed, but only abnormal results are displayed) Labs Reviewed - No data to display  EKG None  Radiology No results found.  Procedures Procedures (including  critical care time)  Medications Ordered in UC Medications  ketorolac (TORADOL) injection 60 mg (60 mg Intramuscular Given 01/19/18 1411)    Initial Impression / Assessment and Plan / UC Course  I have reviewed the triage vital signs and the nursing notes.  Pertinent labs & imaging results that were available during my care of the patient were reviewed by me and considered in my medical decision making (see chart for details).    Worries for herpes zoster given rash to the eyelid, eyebrow, forehead.  Patient without rash to the nose, ear.  IOP 19, fluorescein stain without uptake.  Case discussed with on-call ophthalmologist, Dr. Charlotte Sanes, who would like Valtrex 1 g 3 times daily started, and will see patient in office on Monday, 8:30 AM. Return precautions given. Patient expresses understanding and agrees to plan.   Final Clinical Impressions(s) / UC Diagnoses   Final diagnoses:  Herpes zoster without complication    ED Prescriptions    Medication Sig Dispense Auth. Provider   valACYclovir (VALTREX) 1000 MG tablet Take 1 tablet (1,000 mg total) by mouth 3 (three) times daily for 7 days. 21 tablet [employer]re[employer], [place] 01/19/18 1840

## 2019-02-18 ENCOUNTER — Ambulatory Visit: Payer: BC Managed Care – PPO | Admitting: Allergy and Immunology

## 2019-02-18 ENCOUNTER — Encounter: Payer: Self-pay | Admitting: Allergy and Immunology

## 2019-02-18 ENCOUNTER — Other Ambulatory Visit: Payer: Self-pay

## 2019-02-18 VITALS — BP 162/98 | HR 70 | Temp 97.5°F | Resp 16 | Ht 61.0 in | Wt 158.6 lb

## 2019-02-18 DIAGNOSIS — J4530 Mild persistent asthma, uncomplicated: Secondary | ICD-10-CM

## 2019-02-18 DIAGNOSIS — D849 Immunodeficiency, unspecified: Secondary | ICD-10-CM

## 2019-02-18 DIAGNOSIS — D899 Disorder involving the immune mechanism, unspecified: Secondary | ICD-10-CM

## 2019-02-18 DIAGNOSIS — R011 Cardiac murmur, unspecified: Secondary | ICD-10-CM

## 2019-02-18 DIAGNOSIS — J3089 Other allergic rhinitis: Secondary | ICD-10-CM

## 2019-02-18 DIAGNOSIS — J301 Allergic rhinitis due to pollen: Secondary | ICD-10-CM | POA: Diagnosis not present

## 2019-02-18 DIAGNOSIS — K219 Gastro-esophageal reflux disease without esophagitis: Secondary | ICD-10-CM

## 2019-02-18 DIAGNOSIS — H04123 Dry eye syndrome of bilateral lacrimal glands: Secondary | ICD-10-CM

## 2019-02-18 MED ORDER — ALBUTEROL SULFATE HFA 108 (90 BASE) MCG/ACT IN AERS: 2.0000 | INHALATION_SPRAY | RESPIRATORY_TRACT | 1 refills | Status: DC | PRN

## 2019-02-18 MED ORDER — ESOMEPRAZOLE MAGNESIUM 40 MG PO CPDR: 40.0000 mg | DELAYED_RELEASE_CAPSULE | ORAL | 5 refills | Status: DC

## 2019-02-18 MED ORDER — FAMOTIDINE 40 MG PO TABS: 40.0000 mg | ORAL_TABLET | Freq: Every evening | ORAL | 5 refills | Status: DC

## 2019-02-18 MED ORDER — FLUTICASONE PROPIONATE 50 MCG/ACT NA SUSP: NASAL | 5 refills | Status: DC

## 2019-02-18 MED ORDER — DESLORATADINE 5 MG PO TABS: 5.0000 mg | ORAL_TABLET | Freq: Every day | ORAL | 5 refills | Status: DC

## 2019-02-18 MED ORDER — ARNUITY ELLIPTA 200 MCG/ACT IN AEPB: 1.0000 | INHALATION_SPRAY | Freq: Every day | RESPIRATORY_TRACT | 5 refills | Status: DC

## 2019-02-18 NOTE — Progress Notes (Signed)
[place]   Dear Dr. Dayton MartesAron,  Thank you for referring Taylor Stanley [facility] [place] on 02/18/2019.   Below is a summation of this patient's evaluation and recommendations.  Thank you for your referral. I will keep you informed about this patient's response to treatment.   If you have any questions please do not hesitate to contact me.   Sincerely,  Jessica PriestEric J. Jasim Harari, MD Allergy / Immunology [facility]   ______________________________________________________________________    NEW PATIENT NOTE  Referring Provider: Dianne DunAron, [name] M, MD Primary Provider: Alysia PennaHolwerda, Scott, MD Date of office visit: 02/18/2019    Subjective:   Chief Complaint:  Taylor AmorFrances D Stanley (DOB: 04/01/1956) is a 63 y.o. female who presents to the clinic on 02/18/2019 with a chief complaint of Asthma (coughs in the morning, having chest tightness) and Allergic Rhinitis  (eyes itchy, nose itching) .     HPI: Taylor Stanley presents to this clinic in evaluation of asthma and allergic rhinitis and LPR.  I had apparently seen her in this clinic almost 5 years ago for similar issues.  Taylor Stanley continues to be bothered at this point in time with coughing especially in the morning.  She complains of having phlegm in her throat and some throat clearing and intermittent raspy voice.  She can develop a spell of coughing that will last 2 minutes or so.  Sometimes she also has issues with choking very easily when swallowing a liquid.  These have been longstanding issues of many years duration.  She occasionally has some chest tightness but does not have any issues tied up with wheezing or significant shortness of breath.  She can walk without any difficulty but she does not perform any aerobic exercise on a regular basis.  She has been off her longstanding use of Asmanex for the past year and she has  definitely noticed a little bit more cough since being off that medication.  She also has nasal congestion and sneezing and itchy red watery eyes.  This occurs in the context of diagnosed dry eye syndrome for which she was treated with Restasis in the past and currently uses over-the-counter wetting solution.  She does not have any associated anosmia or ugly nasal discharge or recurrent issues with headache.  She has been skin tested in the past and found to be allergic to pollens and dust mite.  She does have reflux disease all the way up into her throat which is well controlled while using Nexium.  She drinks 2 cups of coffee per day and no other forms of caffeine including chocolate consumption.  She does not drink alcohol.  When I last saw her in this clinic she was recently diagnosed with rheumatoid arthritis and was to start a biological agent.  She has utilized methotrexate and Humira and presently is using golimumab injections every 8 weeks.  Past Medical History:  Diagnosis Date   Anxiety    Asthma    triggered by allergies; does not use inhaler daily   Carpal tunnel syndrome of right wrist 11/2011   Dental crowns present    Depression    GERD (gastroesophageal reflux disease)    Hx of colonic polyps    Hypertension    under control; has been on med. x 5 yrs.   Osteopenia    PONV (postoperative nausea and vomiting)    Seasonal allergies  Seizure (HCC) as an infant   x 1 - unknown cause   Snores    denies apnea   TMJ disease    Wears glasses     Past Surgical History:  Procedure Laterality Date   CARPAL TUNNEL RELEASE  05/08/2007   left   CARPAL TUNNEL RELEASE  12/12/2011   Procedure: CARPAL TUNNEL RELEASE;  Surgeon: Wyn Forsterobert V Sypher Jr., MD;  Location: [facility];  Service: Orthopedics;  Laterality: Right;   CERVICAL DISC SURGERY  2011   CESAREAN SECTION  10-94   BTL   CHOLECYSTECTOMY  07/07/2005   lap. chole.   ESOPHAGEAL DILATION   05/15/2003   LUMBAR DISC SURGERY  early 1990's   SPINE SURGERY     TONSILLECTOMY AND ADENOIDECTOMY  as a child   TRIGGER FINGER RELEASE Right 02/12/2014   Procedure: RELEASE TRIGGER FINGER/A-1 PULLEY RIGHT INDEX AND RIGHT LONG FINGERS;  Surgeon: Wyn Forsterobert Sypher Jr V, MD;  Location: [facility];  Service: Orthopedics;  Laterality: Right;   TUBAL LIGATION  1994   WISDOM TOOTH EXTRACTION      Allergies as of 02/18/2019      Reactions   Penicillins Hives, Swelling      Medication List      albuterol 108 (90 Base) MCG/ACT inhaler Commonly known as: Proventil HFA Inhale 2 puffs into the lungs every 4 (four) hours as needed for wheezing or shortness of breath.   aspirin 81 MG tablet Take 81 mg by mouth daily.   Benicar HCT 40-12.5 MG tablet Generic drug: olmesartan-hydrochlorothiazide TAKE 1 TABLET DAILY   calcium carbonate 600 MG Tabs tablet Commonly known as: OS-CAL Take 600 mg by mouth 2 (two) times daily with a meal.   cholecalciferol 1000 units tablet Commonly known as: VITAMIN D Take 1,000 Units by mouth daily.   desloratadine 5 MG tablet Commonly known as: Clarinex Take 1 tablet (5 mg total) by mouth daily.   esomeprazole 40 MG capsule Commonly known as: NexIUM Take 1 capsule (40 mg total) by mouth daily. AM   fluocinonide cream 0.05 % Commonly known as: LIDEX fluocinonide 0.05 % topical cream   fluticasone 50 MCG/ACT nasal spray Commonly known as: FLONASE fluticasone propionate 50 mcg/actuation nasal spray,suspension   mometasone 220 MCG/INH inhaler Commonly known as: Asmanex (60 Metered Doses) Inhale 2 puffs into the lungs daily.   sertraline 100 MG tablet Commonly known as: ZOLOFT 100 mg daily. Take one half daily   triamcinolone cream 0.1 % Commonly known as: KENALOG APPLY TO AFFECTED AREA TWICE A DAY       Review of systems negative except as noted in HPI / PMHx or noted below:  Review of Systems  Constitutional: Negative.     HENT: Negative.   Eyes: Negative.   Respiratory: Negative.   Cardiovascular: Negative.   Gastrointestinal: Negative.   Genitourinary: Negative.   Musculoskeletal: Negative.   Skin: Negative.   Neurological: Negative.   Endo/Heme/Allergies: Negative.   Psychiatric/Behavioral: Negative.     Family History  Problem Relation Age of Onset   Colon cancer Mother    Obesity Mother    Diabetes Mother    Deep vein thrombosis Mother    Hypertension Mother        pacemaker   Emphysema Mother    Thyroid disease Mother    Hypertension Father        pacemaker   Alzheimer's disease Father    Hypothyroidism Father    Diabetes Sister  Obesity Sister    Hypertension Sister    COPD Sister    Hypothyroidism Sister    Thyroid disease Sister    Fibromyalgia Sister    Bipolar disorder Sister    Hypothyroidism Sister    Suicidality Sister     Social History   Socioeconomic History   Marital status: Married    Spouse name: Not on file   Number of children: 2   Years of education: Not on file   Highest education level: Not on file  Occupational History   Occupation: HAIRDRESSER    Employer: [employer]  Social Needs   Financial resource strain: Not on file   Food insecurity    Worry: Not on file    Inability: Not on file   Transportation needs    Medical: Not on file    Non-medical: Not on file  Tobacco Use   Smoking status: Former Smoker    Quit date: 02/10/2005    Years since quitting: 14.0   Smokeless tobacco: Never Used  Substance and Sexual Activity   Alcohol use: No   Drug use: No   Sexual activity: Not on file  Lifestyle   Physical activity    Days per week: Not on file    Minutes per session: Not on file   Stress: Not on file  Relationships   Social connections    Talks on phone: Not on file    Gets together: Not on file    Attends religious service: Not on file    Active member of club or organization: Not on file     Attends meetings of clubs or organizations: Not on file    Relationship status: Not on file   Intimate partner violence    Fear of current or ex partner: Not on file    Emotionally abused: Not on file    Physically abused: Not on file    Forced sexual activity: Not on file  Other Topics Concern   Not on file  Social History Narrative   Not on file    Environmental and Social history  Lives in a house with a dry environment, a dog located inside the household, carpet in the bedroom, plastic on the bed, no plastic on the pillow, and no smoking ongoing with inside the household.  Objective:   Vitals:   02/18/19 0935 02/18/19 1008  BP: (!) 170/90 (!) 162/98  Pulse: 70   Resp: 16   Temp: (!) 97.5 F (36.4 C)   SpO2: 96%    Height: 5\' 1"  (154.9 cm) Weight: 158 lb 9.6 oz (71.9 kg)  Physical Exam Constitutional:      Appearance: She is not diaphoretic.  HENT:     Head: Normocephalic.     Right Ear: Tympanic membrane, ear canal and external ear normal.     Left Ear: Tympanic membrane, ear canal and external ear normal.     Nose: Nose normal. No mucosal edema or rhinorrhea.     Mouth/Throat:     Pharynx: Uvula midline. No oropharyngeal exudate.  Eyes:     Conjunctiva/sclera: Conjunctivae normal.  Neck:     Thyroid: No thyromegaly.     Trachea: Trachea normal. No tracheal tenderness or tracheal deviation.  Cardiovascular:     Rate and Rhythm: Normal rate and regular rhythm.     Heart sounds: S1 normal and S2 normal. Murmur (Systolic) present.  Pulmonary:     Effort: No respiratory distress.     Breath sounds:  Normal breath sounds. No stridor. No wheezing or rales.  Lymphadenopathy:     Head:     Right side of head: No tonsillar adenopathy.     Left side of head: No tonsillar adenopathy.     Cervical: No cervical adenopathy.  Skin:    Findings: No erythema or rash.     Nails: There is no clubbing.   Neurological:     Mental Status: She is alert.      Diagnostics: Allergy skin tests were not performed.   Spirometry was performed and demonstrated an FEV1 of 1.54 @ 72 % of predicted. FEV1/FVC = 0.74  Assessment and Plan:    1. Not well controlled mild persistent asthma   2. Perennial allergic rhinitis   3. Seasonal allergic rhinitis due to pollen   4. LPRD (laryngopharyngeal reflux disease)   5. Dry eye syndrome of both eyes   6. Immunosuppression ([place])   7. Systolic murmur     1. Allergen avoidance measures - dust mite / pollens  2. Treat and prevent infalammation:   A.  Arnuity 200-1 inhalation 1 time per day  B.  Flonase-1-2 sprays each nostril 1 time per day  3.  Treat and prevent reflux:   A.  Consolidate caffeine consumption  B.  Continue Nexium 40 mg tablet in a.m.  C.  Start famotidine 40 mg tablet in p.m.  3. If needed:   A.  Albuterol HFA-2 inhalations every 4-6 hours  B.  antihistamine- Clarinex 5 mg 1 time per day (Dry Eye?)  C.  OTC nasal saline  D.  OTC Systane eyedrops  4. Obtain fall flu vaccine (and COVID vacine)  5.  Return to clinic in 4 weeks or earlier if problem  6. Obtain 2D ECHO w/doppler for systolic murmur  It appears that Taylor Stanley has developed some inflammation and irritation of her airway most likely based upon her atopic disease and her reflux induced respiratory disease for which she will utilize a plan of action noted above to address both issues.  As well, she has a murmur and we will investigate that issue with a echocardiogram.  She has systemic arterial hypertension and we had a talk about that issue today and she needs to establish if this is a trend and if so then have this addressed by her primary care doctor.  Jiles Prows, MD Allergy / Immunology [facility] [place]

## 2019-02-18 NOTE — Patient Instructions (Addendum)
  1. Allergen avoidance measures - dust mite / pollens  2. Treat and prevent infalammation:   A.  Arnuity 200-1 inhalation 1 time per day  B.  Flonase-1-2 sprays each nostril 1 time per day  3.  Treat and prevent reflux:   A.  Consolidate caffeine consumption  B.  Continue Nexium 40 mg tablet in a.m.  C.  Start famotidine 40 mg tablet in p.m.  3. If needed:   A.  Albuterol HFA-2 inhalations every 4-6 hours  B.  antihistamine- Clarinex 5 mg 1 time per day (Dry Eye?)  C.  OTC nasal saline  D.  OTC Systane eyedrops  4. Obtain fall flu vaccine (and COVID vacine)  5.  Return to clinic in 4 weeks or earlier if problem  6. Obtain 2D ECHO w/doppler for systolic murmur

## 2019-02-19 ENCOUNTER — Encounter: Payer: Self-pay | Admitting: Allergy and Immunology

## 2019-02-25 ENCOUNTER — Ambulatory Visit (HOSPITAL_COMMUNITY)
Admission: RE | Admit: 2019-02-25 | Discharge: 2019-02-25 | Disposition: A | Discharge status: Home / Self Care | Payer: BC Managed Care – PPO | Source: Ambulatory Visit | Attending: Allergy and Immunology | Admitting: Allergy and Immunology

## 2019-02-25 ENCOUNTER — Other Ambulatory Visit: Payer: Self-pay

## 2019-02-25 DIAGNOSIS — R011 Cardiac murmur, unspecified: Secondary | ICD-10-CM | POA: Diagnosis not present

## 2019-02-25 DIAGNOSIS — R569 Unspecified convulsions: Secondary | ICD-10-CM | POA: Insufficient documentation

## 2019-02-25 DIAGNOSIS — I351 Nonrheumatic aortic (valve) insufficiency: Secondary | ICD-10-CM | POA: Diagnosis not present

## 2019-02-25 DIAGNOSIS — Z87891 Personal history of nicotine dependence: Secondary | ICD-10-CM | POA: Diagnosis not present

## 2019-02-25 DIAGNOSIS — K219 Gastro-esophageal reflux disease without esophagitis: Secondary | ICD-10-CM | POA: Insufficient documentation

## 2019-02-25 DIAGNOSIS — I10 Essential (primary) hypertension: Secondary | ICD-10-CM | POA: Insufficient documentation

## 2019-02-25 NOTE — Progress Notes (Signed)
  Echocardiogram 2D Echocardiogram has been performed.  Bartley Vuolo G Louellen Haldeman 02/25/2019, 11:08 AM

## 2019-03-03 ENCOUNTER — Telehealth: Payer: Self-pay | Admitting: Allergy and Immunology

## 2019-03-03 NOTE — Telephone Encounter (Signed)
Patient would like to know if Dr. Neldon Mc has received the results of her Echo Cardiogram, that she had done last week.

## 2019-03-03 NOTE — Telephone Encounter (Signed)
Dr. Neldon Mc please review.

## 2019-03-25 ENCOUNTER — Other Ambulatory Visit: Payer: Self-pay

## 2019-03-25 ENCOUNTER — Encounter: Payer: Self-pay | Admitting: Allergy and Immunology

## 2019-03-25 ENCOUNTER — Ambulatory Visit: Payer: BC Managed Care – PPO | Admitting: Allergy and Immunology

## 2019-03-25 VITALS — BP 136/72 | HR 72 | Resp 16

## 2019-03-25 DIAGNOSIS — J3089 Other allergic rhinitis: Secondary | ICD-10-CM | POA: Diagnosis not present

## 2019-03-25 DIAGNOSIS — Q238 Other congenital malformations of aortic and mitral valves: Secondary | ICD-10-CM

## 2019-03-25 DIAGNOSIS — I359 Nonrheumatic aortic valve disorder, unspecified: Secondary | ICD-10-CM

## 2019-03-25 DIAGNOSIS — H04123 Dry eye syndrome of bilateral lacrimal glands: Secondary | ICD-10-CM

## 2019-03-25 DIAGNOSIS — J301 Allergic rhinitis due to pollen: Secondary | ICD-10-CM | POA: Diagnosis not present

## 2019-03-25 DIAGNOSIS — J4530 Mild persistent asthma, uncomplicated: Secondary | ICD-10-CM | POA: Diagnosis not present

## 2019-03-25 DIAGNOSIS — K219 Gastro-esophageal reflux disease without esophagitis: Secondary | ICD-10-CM | POA: Diagnosis not present

## 2019-03-25 NOTE — Patient Instructions (Addendum)
  1.  Continue to perform allergen avoidance measures - dust mite / pollens  2.  Continue to treat and prevent infalammation:   A.  Arnuity 200-1 inhalation 1 time per day  B.  Flonase-1-2 sprays each nostril 1 time per day  3.  Continue to treat and prevent reflux:   A.  Consolidate caffeine consumption  B.  Nexium 40 mg tablet in a.m.  C.  famotidine 40 mg tablet in p.m.  4. If needed:   A.  Albuterol HFA-2 inhalations every 4-6 hours  B.  antihistamine- Clarinex 5 mg 1 time per day (Dry Eye?)  C.  OTC nasal saline  D.  OTC Systane eyedrops  5.  Healthy living lifestyle with good diet and exercise  6.  Return to clinic in 8 weeks or earlier if problem  7. Obtain fall flu vaccine (and COVID vaccine)

## 2019-03-25 NOTE — Progress Notes (Signed)
[place] [facility] [place]   Follow-up Note  Referring Provider: Velna Hatchet, MD Primary Provider: Velna Hatchet, MD Date of Office Visit: 03/25/2019  Subjective:   Taylor Stanley (DOB: [birth year]) is a 63 y.o. female who returns to the [facility] on 03/25/2019 in re-evaluation of the following:  HPI: Dub Mikes returns to this clinic in reevaluation of asthma and allergic rhinitis and LPR.  I last saw her in this clinic on 18 February 2019.  At that point in time we made an attempt to address each issue.  She still continues to have some occasional coughing in the morning although her spells of cough seem to have abated.  She still has some throat clearing and intermittent raspy voice and still appears to develop some issues with choking occasionally when drinking liquids.  She continues on inhaled and nasal steroids.  Her reflux is better on her current plan.  She still continues to drink a few cups of coffee per day.  She has been using a combination of a proton pump inhibitor and H2 receptor blocker.  She still has some occasional nasal congestion and sneezing and itchy red watery eyes.  She is going to have cataract surgery performed 18 April 2019.  She does have a history of steroid eyedrop induced glaucoma.  She continues to use over-the-counter Systane at this point.  Allergies as of 03/25/2019      Reactions   Penicillins Hives, Swelling      Medication List    albuterol 108 (90 Base) MCG/ACT inhaler Commonly known as: Proventil HFA Inhale 2 puffs into the lungs every 4 (four) hours as needed.   Arnuity Ellipta 200 MCG/ACT Aepb Generic drug: Fluticasone Furoate Inhale 1 puff into the lungs daily.   aspirin 81 MG tablet Take 81 mg by mouth daily.   Benicar HCT 40-12.5 MG tablet Generic drug: olmesartan-hydrochlorothiazide TAKE 1 TABLET DAILY   calcium carbonate 600 MG Tabs tablet Commonly known as: OS-CAL Take  600 mg by mouth 2 (two) times daily with a meal.   cholecalciferol 1000 units tablet Commonly known as: VITAMIN D Take 1,000 Units by mouth daily.   desloratadine 5 MG tablet Commonly known as: CLARINEX Take 1 tablet (5 mg total) by mouth daily.   esomeprazole 40 MG capsule Commonly known as: NexIUM Take 1 capsule (40 mg total) by mouth daily. AM   esomeprazole 40 MG capsule Commonly known as: NEXIUM Take 1 capsule (40 mg total) by mouth every morning.   famotidine 40 MG tablet Commonly known as: PEPCID Take 1 tablet (40 mg total) by mouth every evening.   fluocinonide cream 0.05 % Commonly known as: LIDEX fluocinonide 0.05 % topical cream   fluticasone 50 MCG/ACT nasal spray Commonly known as: FLONASE fluticasone propionate 50 mcg/actuation nasal spray,suspension   fluticasone 50 MCG/ACT nasal spray Commonly known as: Flonase 1-2 sprays each nostril daily   sertraline 100 MG tablet Commonly known as: ZOLOFT 100 mg daily. Take one half daily   triamcinolone cream 0.1 % Commonly known as: KENALOG APPLY TO AFFECTED AREA TWICE A DAY       Past Medical History:  Diagnosis Date  . Anxiety   . Asthma    triggered by allergies; does not use inhaler daily  . Carpal tunnel syndrome of right wrist 11/2011  . Dental crowns present   . Depression   . GERD (gastroesophageal reflux disease)   . Hx of colonic polyps   .  Hypertension    under control; has been on med. x 5 yrs.  . Osteopenia   . PONV (postoperative nausea and vomiting)   . Seasonal allergies   . Seizure (HCC) as an infant   x 1 - unknown cause  . Snores    denies apnea  . TMJ disease   . Wears glasses     Past Surgical History:  Procedure Laterality Date  . CARPAL TUNNEL RELEASE  05/08/2007   left  . CARPAL TUNNEL RELEASE  12/12/2011   Procedure: CARPAL TUNNEL RELEASE;  Surgeon: Wyn Forsterobert V Sypher Jr., MD;  Location: [facility];  Service: Orthopedics;  Laterality: Right;  . CERVICAL  DISC SURGERY  2011  . CESAREAN SECTION  10-94   BTL  . CHOLECYSTECTOMY  07/07/2005   lap. chole.  . ESOPHAGEAL DILATION  05/15/2003  . LUMBAR DISC SURGERY  early 1990's  . SPINE SURGERY    . TONSILLECTOMY AND ADENOIDECTOMY  as a child  . TRIGGER FINGER RELEASE Right 02/12/2014   Procedure: RELEASE TRIGGER FINGER/A-1 PULLEY RIGHT INDEX AND RIGHT LONG FINGERS;  Surgeon: Wyn Forsterobert Sypher Jr V, MD;  Location: [facility];  Service: Orthopedics;  Laterality: Right;  . TUBAL LIGATION  1994  . WISDOM TOOTH EXTRACTION      Review of systems negative except as noted in HPI / PMHx or noted below:  Review of Systems  Constitutional: Negative.   HENT: Negative.   Eyes: Negative.   Respiratory: Negative.   Cardiovascular: Negative.   Gastrointestinal: Negative.   Genitourinary: Negative.   Musculoskeletal: Negative.   Skin: Negative.   Neurological: Negative.   Endo/Heme/Allergies: Negative.   Psychiatric/Behavioral: Negative.      Objective:   Vitals:   03/25/19 1541  BP: 136/72  Pulse: 72  Resp: 16  SpO2: 97%          Physical Exam Constitutional:      Appearance: She is not diaphoretic.  HENT:     Head: Normocephalic.     Right Ear: Tympanic membrane, ear canal and external ear normal.     Left Ear: Tympanic membrane, ear canal and external ear normal.     Nose: Nose normal. No mucosal edema or rhinorrhea.     Mouth/Throat:     Pharynx: Uvula midline. No oropharyngeal exudate.  Eyes:     Conjunctiva/sclera: Conjunctivae normal.  Neck:     Thyroid: No thyromegaly.     Trachea: Trachea normal. No tracheal tenderness or tracheal deviation.  Cardiovascular:     Rate and Rhythm: Normal rate and regular rhythm.     Heart sounds: S1 normal and S2 normal. Murmur (Systolic) present.  Pulmonary:     Effort: No respiratory distress.     Breath sounds: Normal breath sounds. No stridor. No wheezing or rales.  Lymphadenopathy:     Head:     Right side of head: No  tonsillar adenopathy.     Left side of head: No tonsillar adenopathy.     Cervical: No cervical adenopathy.  Skin:    Findings: No erythema or rash.     Nails: There is no clubbing.   Neurological:     Mental Status: She is alert.     Diagnostics:    Spirometry was performed and demonstrated an FEV1 of 1.49 at 70 % of predicted.  Results of an echocardiogram obtained 25 February 2019 identifies the following:  1. The left ventricle has normal systolic function with an ejection fraction of  60-65%. The cavity size was normal. Left ventricular diastolic Doppler parameters are consistent with impaired relaxation. No evidence of left ventricular regional wall  motion abnormalities.  2. The right ventricle has normal systolic function. The cavity was normal. There is no increase in right ventricular wall thickness.  3. The aortic valve is tricuspid. Sclerosis without any evidence of stenosis of the aortic valve. Aortic valve regurgitation is mild by color flow Doppler.  4. The aortic valve is mildly calcified and the LCC and NCC seem to be partially fused.  5. The aorta is normal in size and structure.  Assessment and Plan:   1. Not well controlled mild persistent asthma   2. Perennial allergic rhinitis   3. Seasonal allergic rhinitis due to pollen   4. LPRD (laryngopharyngeal reflux disease)   5. Dry eye syndrome of both eyes   6. Aortic valve cusp abnormality   7. Aortic valve calcification     1.  Continue to perform allergen avoidance measures - dust mite / pollens  2.  Continue to treat and prevent infalammation:   A.  Arnuity 200-1 inhalation 1 time per day  B.  Flonase-1-2 sprays each nostril 1 time per day  3.  Continue to treat and prevent reflux:   A.  Consolidate caffeine consumption  B.  Nexium 40 mg tablet in a.m.  C.  famotidine 40 mg tablet in p.m.  4. If needed:   A.  Albuterol HFA-2 inhalations every 4-6 hours  B.  antihistamine- Clarinex 5 mg 1 time per day  (Dry Eye?)  C.  OTC nasal saline  D.  OTC Systane eyedrops  5.  Healthy living lifestyle with good diet and exercise  6.  Return to clinic in 8 weeks or earlier if problem  7. Obtain fall flu vaccine (and COVID vaccine)  Thelma BargeFrancis will remain on a combination of therapy directed against respiratory tract inflammation and reflux as noted above and I will see her back in this clinic in 8 weeks which will be a total of 12 weeks of this plan.  Hopefully she will receive significant benefit at that point in time from this plan.  She appears to have aortic valve calcification and mild fusion of her cusps although there is not a significant gradient across her aortic valve.  I think the best thing that she can do at this point in time is to undergo a healthy living lifestyle with good diet with low amounts of saturated fats and sweets and undergo a progressive exercise program.  We had a talk today about some of the things that she can do to try and get healthier regarding eating and exercise.  She will need to check with her primary care doctor about her lipid status to see if there is a requirement for a statin.  I will see her back in this clinic in 8 weeks or earlier if there is a problem.  Laurette SchimkeEric Kozlow, MD Allergy / Immunology [facility]

## 2019-03-26 ENCOUNTER — Encounter: Payer: Self-pay | Admitting: Allergy and Immunology

## 2019-04-23 ENCOUNTER — Other Ambulatory Visit: Payer: Self-pay | Admitting: Allergy and Immunology

## 2019-05-01 HISTORY — PX: OTHER SURGICAL HISTORY: SHX169

## 2019-05-10 ENCOUNTER — Other Ambulatory Visit: Payer: Self-pay | Admitting: Allergy and Immunology

## 2019-05-20 ENCOUNTER — Encounter: Payer: Self-pay | Admitting: Allergy and Immunology

## 2019-05-20 ENCOUNTER — Ambulatory Visit: Payer: BC Managed Care – PPO | Admitting: Allergy and Immunology

## 2019-05-20 ENCOUNTER — Other Ambulatory Visit: Payer: Self-pay

## 2019-05-20 VITALS — BP 134/82 | HR 94 | Temp 98.0°F | Resp 18

## 2019-05-20 DIAGNOSIS — J301 Allergic rhinitis due to pollen: Secondary | ICD-10-CM | POA: Diagnosis not present

## 2019-05-20 DIAGNOSIS — M546 Pain in thoracic spine: Secondary | ICD-10-CM

## 2019-05-20 DIAGNOSIS — J3089 Other allergic rhinitis: Secondary | ICD-10-CM | POA: Diagnosis not present

## 2019-05-20 DIAGNOSIS — K219 Gastro-esophageal reflux disease without esophagitis: Secondary | ICD-10-CM

## 2019-05-20 DIAGNOSIS — J4530 Mild persistent asthma, uncomplicated: Secondary | ICD-10-CM

## 2019-05-20 MED ORDER — ALBUTEROL SULFATE HFA 108 (90 BASE) MCG/ACT IN AERS: 2.0000 | INHALATION_SPRAY | RESPIRATORY_TRACT | 5 refills | Status: DC | PRN

## 2019-05-20 MED ORDER — DESLORATADINE 5 MG PO TABS: 5.0000 mg | ORAL_TABLET | Freq: Every day | ORAL | 5 refills | Status: DC

## 2019-05-20 MED ORDER — FLUTICASONE PROPIONATE 50 MCG/ACT NA SUSP: NASAL | 5 refills | Status: DC

## 2019-05-20 MED ORDER — ESOMEPRAZOLE MAGNESIUM 40 MG PO CPDR: 40.0000 mg | DELAYED_RELEASE_CAPSULE | ORAL | 5 refills | Status: DC

## 2019-05-20 MED ORDER — FAMOTIDINE 40 MG PO TABS: 40.0000 mg | ORAL_TABLET | Freq: Every evening | ORAL | 5 refills | Status: DC

## 2019-05-20 MED ORDER — ARNUITY ELLIPTA 200 MCG/ACT IN AEPB: 1.0000 | INHALATION_SPRAY | Freq: Every day | RESPIRATORY_TRACT | 5 refills | Status: DC

## 2019-05-20 NOTE — Progress Notes (Signed)
[place] - [place] - [place] - [place] - [place]   Follow-up Note  Referring Provider: Alysia Penna, MD Primary Provider: Alysia Penna, MD Date of Office Visit: 05/20/2019  Subjective:   Taylor Stanley (DOB: 1956/01/09) is a 63 y.o. female who [facility] in re-evaluation of the following:  HPI: Taylor Stanley returns to this clinic in evaluation of asthma and allergic rhinitis and LPR.  Her last visit to this clinic was 25 March 2019.   At this point she appears to be doing relatively well regarding her respiratory tract issue and does not need to use a short acting bronchodilator.  She walks with her husband and she does get somewhat short of breath when going up a hill but she can still make it up the hill without stopping.  She had very little issues with her upper airway at this point in time.  She has not been having any issues with reflux fortunately.  She has consolidated her caffeine consumption.  She continues to use a combination of therapy directed against reflux.  She still has a little bit of a throat clearing type cough upon awakening in the morning that lasts less than 10 minutes but otherwise has no significant respiratory tract symptoms.  She has not required a systemic steroid or an antibiotic for any type of airway issue since being seen in this clinic.  She has had a chest pain issue.  For the past 2 weeks she has had a few days of a back pain that radiates around to the front of her sternum right around her bra line that has a pleuritic quality.  Her last episode was this past Saturday and it lasted several hours and is slowly improving.  She will take some Tylenol which helps somewhat.  Allergies as of 05/20/2019      Reactions   Penicillins Hives, Swelling      Medication List      albuterol 108 (90 Base) MCG/ACT inhaler Commonly known as: Proventil HFA Inhale 2 puffs into the lungs every 4 (four)  hours as needed.   Arnuity Ellipta 200 MCG/ACT Aepb Generic drug: Fluticasone Furoate Inhale 1 puff into the lungs daily.   aspirin 81 MG tablet Take 81 mg by mouth daily.   Benicar HCT 40-12.5 MG tablet Generic drug: olmesartan-hydrochlorothiazide TAKE 1 TABLET DAILY   calcium carbonate 600 MG Tabs tablet Commonly known as: OS-CAL Take 600 mg by mouth 2 (two) times daily with a meal.   cholecalciferol 1000 units tablet Commonly known as: VITAMIN D Take 1,000 Units by mouth daily.   desloratadine 5 MG tablet Commonly known as: CLARINEX Take 1 tablet (5 mg total) by mouth daily. As needed.    esomeprazole 40 MG capsule Commonly known as: NEXIUM Take 1 capsule (40 mg total) by mouth every morning.   famotidine 40 MG tablet Commonly known as: PEPCID Take 1 tablet (40 mg total) by mouth every evening.   fluocinonide cream 0.05 % Commonly known as: LIDEX fluocinonide 0.05 % topical cream   fluticasone 50 MCG/ACT nasal spray Commonly known as: Flonase 1-2 sprays each nostril daily   ibuprofen 600 MG tablet Commonly known as: ADVIL TAKE 1 TABLET EVERY 6 TO 8 HOURS AS NEEDED FOR PAIN   sertraline 100 MG tablet Commonly known as: ZOLOFT 100 mg daily. Take one half daily   triamcinolone cream 0.1 % Commonly known as: KENALOG APPLY TO AFFECTED AREA TWICE A DAY  Past Medical History:  Diagnosis Date  . Anxiety   . Asthma    triggered by allergies; does not use inhaler daily  . Carpal tunnel syndrome of right wrist 11/2011  . Dental crowns present   . Depression   . GERD (gastroesophageal reflux disease)   . Hx of colonic polyps   . Hypertension    under control; has been on med. x 5 yrs.  . Osteopenia   . PONV (postoperative nausea and vomiting)   . Seasonal allergies   . Seizure (HCC) as an infant   x 1 - unknown cause  . Snores    denies apnea  . TMJ disease   . Wears glasses     Past Surgical History:  Procedure Laterality Date  . CARPAL  TUNNEL RELEASE  05/08/2007   left  . CARPAL TUNNEL RELEASE  12/12/2011   Procedure: CARPAL TUNNEL RELEASE;  Surgeon: Wyn Forsterobert V Sypher Jr., MD;  Location: [facility];  Service: Orthopedics;  Laterality: Right;  . catarac removal right eye Right 05/2019  . CERVICAL DISC SURGERY  2011  . CESAREAN SECTION  10-94   BTL  . CHOLECYSTECTOMY  07/07/2005   lap. chole.  . ESOPHAGEAL DILATION  05/15/2003  . LUMBAR DISC SURGERY  early 1990's  . SPINE SURGERY    . TONSILLECTOMY AND ADENOIDECTOMY  as a child  . TRIGGER FINGER RELEASE Right 02/12/2014   Procedure: RELEASE TRIGGER FINGER/A-1 PULLEY RIGHT INDEX AND RIGHT LONG FINGERS;  Surgeon: Wyn Forsterobert Sypher Jr V, MD;  Location: [facility];  Service: Orthopedics;  Laterality: Right;  . TUBAL LIGATION  1994  . WISDOM TOOTH EXTRACTION      Review of systems negative except as noted in HPI / PMHx or noted below:  Review of Systems  Constitutional: Negative.   HENT: Negative.   Eyes: Negative.   Respiratory: Negative.   Cardiovascular: Negative.   Gastrointestinal: Negative.   Genitourinary: Negative.   Musculoskeletal: Negative.   Skin: Negative.   Neurological: Negative.   Endo/Heme/Allergies: Negative.   Psychiatric/Behavioral: Negative.      Objective:   Vitals:   05/20/19 1118  BP: 134/82  Pulse: 94  Resp: 18  Temp: 98 F (36.7 C)  SpO2: 96%          Physical Exam Constitutional:      Appearance: She is not diaphoretic.  HENT:     Head: Normocephalic.     Right Ear: Tympanic membrane, ear canal and external ear normal.     Left Ear: Tympanic membrane, ear canal and external ear normal.     Nose: Nose normal. No mucosal edema or rhinorrhea.     Mouth/Throat:     Pharynx: Uvula midline. No oropharyngeal exudate.  Eyes:     Conjunctiva/sclera: Conjunctivae normal.  Neck:     Thyroid: No thyromegaly.     Trachea: Trachea normal. No tracheal tenderness or tracheal deviation.  Cardiovascular:      Rate and Rhythm: Normal rate and regular rhythm.     Heart sounds: S1 normal and S2 normal. Murmur (Systolic) present.  Pulmonary:     Effort: No respiratory distress.     Breath sounds: Normal breath sounds. No stridor. No wheezing or rales.  Musculoskeletal:     Comments: No palpable tenderness or mass along thoracic spine or sternum  Lymphadenopathy:     Head:     Right side of head: No tonsillar adenopathy.     Left side of head:  No tonsillar adenopathy.     Cervical: No cervical adenopathy.  Skin:    Findings: No erythema or rash.     Nails: There is no clubbing.   Neurological:     Mental Status: She is alert.     Diagnostics:    Spirometry was performed and demonstrated an FEV1 of 1.50 at 70 % of predicted.  The patient had an Asthma Control Test with the following results: ACT Total Score: 14.    Assessment and Plan:   1. Asthma, well controlled, mild persistent   2. Perennial allergic rhinitis   3. Seasonal allergic rhinitis due to pollen   4. LPRD (laryngopharyngeal reflux disease)   5. Acute midline thoracic back pain     1.  Continue to perform allergen avoidance measures - dust mite / pollens  2.  Continue to treat and prevent infalammation:   A.  Arnuity 200-1 inhalation 1 time per day  B.  Flonase-1-2 sprays each nostril 1 time per day  3.  Continue to treat and prevent reflux:   A.  Consolidate caffeine consumption  B.  Nexium 40 mg tablet in a.m.  C.  famotidine 40 mg tablet in p.m.  4. If needed:   A.  Albuterol HFA-2 inhalations every 4-6 hours  B.  antihistamine- Clarinex 5 mg 1 time per day    C.  OTC nasal saline  D.  OTC Systane eyedrops  5.  Obtain a thoracic x-ray for back pain  6.  Return to clinic in 6 months or earlier if problem  7. Obtain fall flu vaccine (and COVID vaccine)  Overall Dub Mikes appears to be doing pretty well on her current therapy and we will keep her on anti-inflammatory agents for her airway and therapy  directed against reflux as noted above.  She does have this thoracic pain that radiates around to her sternum that appears to be pleuritic.  I am going to have her obtain a thoracic x-ray to see if we have a problem with a collapsed vertebrae or some other radiologically identified abnormality.  Fortunately this appears to be an intermittent issue and is not associated with any cardiac symptoms and given the fact that her echocardiogram obtained July 2020 was essentially normal except for some calcifications I do not think we need to worry about pericarditis.  I will contact her with the results of her x-ray once they are available for review.  Allena Katz, MD Allergy / Immunology [place]

## 2019-05-20 NOTE — Patient Instructions (Addendum)
  1.  Continue to perform allergen avoidance measures - dust mite / pollens  2.  Continue to treat and prevent infalammation:   A.  Arnuity 200-1 inhalation 1 time per day  B.  Flonase-1-2 sprays each nostril 1 time per day  3.  Continue to treat and prevent reflux:   A.  Consolidate caffeine consumption  B.  Nexium 40 mg tablet in a.m.  C.  famotidine 40 mg tablet in p.m.  4. If needed:   A.  Albuterol HFA-2 inhalations every 4-6 hours  B.  antihistamine- Clarinex 5 mg 1 time per day    C.  OTC nasal saline  D.  OTC Systane eyedrops  5.  Obtain a thoracic x-ray for back pain  6.  Return to clinic in 6 months or earlier if problem  7. Obtain fall flu vaccine (and COVID vaccine)

## 2019-05-21 ENCOUNTER — Encounter: Payer: Self-pay | Admitting: Allergy and Immunology

## 2019-05-22 ENCOUNTER — Telehealth: Payer: Self-pay | Admitting: Allergy and Immunology

## 2019-05-22 NOTE — Telephone Encounter (Signed)
Informed patient

## 2019-05-22 NOTE — Telephone Encounter (Signed)
Patient called stating that she was referred to get a chest X-ray by Dr. Neldon Mc but has not heard anything about scheduling it. The outgoing referral is in with a status of "need scheduling".  Please advise.

## 2019-05-22 NOTE — Telephone Encounter (Signed)
I spoke with the scheduling department at [facility]. No appointment is needed for the Xray. She just needs to do a walk in. I did call and leave a message requesting a call back to inform her of this information.

## 2019-05-22 NOTE — Telephone Encounter (Signed)
Noted! Thank you

## 2019-05-23 ENCOUNTER — Encounter: Payer: Self-pay | Admitting: Internal Medicine

## 2019-05-23 ENCOUNTER — Ambulatory Visit
Admission: RE | Admit: 2019-05-23 | Discharge: 2019-05-23 | Disposition: A | Discharge status: Home / Self Care | Payer: BC Managed Care – PPO | Source: Ambulatory Visit | Attending: Allergy and Immunology | Admitting: Allergy and Immunology

## 2019-05-23 ENCOUNTER — Encounter: Payer: Self-pay | Admitting: *Deleted

## 2019-05-29 ENCOUNTER — Other Ambulatory Visit: Payer: Self-pay | Admitting: Internal Medicine

## 2019-05-29 ENCOUNTER — Ambulatory Visit
Admission: RE | Admit: 2019-05-29 | Discharge: 2019-05-29 | Disposition: A | Discharge status: Home / Self Care | Payer: BC Managed Care – PPO | Source: Ambulatory Visit | Attending: Internal Medicine | Admitting: Internal Medicine

## 2019-05-29 DIAGNOSIS — R0789 Other chest pain: Secondary | ICD-10-CM

## 2019-11-18 ENCOUNTER — Encounter: Payer: Self-pay | Admitting: Allergy and Immunology

## 2019-11-18 ENCOUNTER — Ambulatory Visit (INDEPENDENT_AMBULATORY_CARE_PROVIDER_SITE_OTHER): Payer: Medicare Other | Admitting: Allergy and Immunology

## 2019-11-18 ENCOUNTER — Other Ambulatory Visit: Payer: Self-pay

## 2019-11-18 VITALS — BP 124/82 | HR 60 | Temp 97.6°F | Resp 16

## 2019-11-18 DIAGNOSIS — K219 Gastro-esophageal reflux disease without esophagitis: Secondary | ICD-10-CM | POA: Diagnosis not present

## 2019-11-18 DIAGNOSIS — J4530 Mild persistent asthma, uncomplicated: Secondary | ICD-10-CM | POA: Diagnosis not present

## 2019-11-18 DIAGNOSIS — J3089 Other allergic rhinitis: Secondary | ICD-10-CM

## 2019-11-18 DIAGNOSIS — J301 Allergic rhinitis due to pollen: Secondary | ICD-10-CM

## 2019-11-18 NOTE — Patient Instructions (Addendum)
  1.  Continue to perform allergen avoidance measures - dust mite / pollens  2.  Continue to treat and prevent infalammation:   A.  Arnuity 200-1 inhalation 1 time per day  B.  Flonase-1-2 sprays each nostril 1 time per day  3.  Continue to treat and prevent reflux:   A.  Consolidate caffeine consumption  B.  Nexium 40 mg tablet in a.m.  C.  famotidine 40 mg tablet in p.m.  4. If needed:   A.  Albuterol HFA-2 inhalations every 4-6 hours  B.  antihistamine- Clarinex 5 mg 1 time per day    C.  OTC nasal saline  D.  OTC Systane eyedrops   5. Return to clinic in 6 months or earlier if problem  7. Obtain COVID vaccine

## 2019-11-18 NOTE — Progress Notes (Signed)
[place] - [place] - [place] - [place] - [place]   Follow-up Note  Referring Provider: Alysia Penna, MD Primary Provider: Alysia Penna, MD Date of Office Visit: 11/18/2019  Subjective:   Taylor Stanley (DOB: 1956/01/17) is a 64 y.o. female who [facility]2019 in re-evaluation of the following:  HPI: Taylor Stanley returns to this clinic in reevaluation of asthma and allergic rhinitis and LPR.  Her last visit to this clinic was 20 March 2019.  She has really done well with her asthma and rarely uses a short acting bronchodilator and can exert herself without any problem.  Her throat clearing-like cough has for the most part resolved.  She no longer wakes up every morning and has a 10-minute coughing episode.  When she was last seen in this clinic she had a chest pain that radiated around to her thoracic back.  We obtained a few x-rays and nothing came up abnormal.  Fortunately, that entire issue has resolved.  Her nose is really doing quite well.  Her reflux is doing quite well.  Allergies as of 11/18/2019      Reactions   Penicillins Hives, Swelling      Medication List      albuterol 108 (90 Base) MCG/ACT inhaler Commonly known as: Proventil HFA Inhale 2 puffs into the lungs every 4 (four) hours as needed.   Arnuity Ellipta 200 MCG/ACT Aepb Generic drug: Fluticasone Furoate Inhale 1 puff into the lungs daily.   aspirin 81 MG tablet Take 81 mg by mouth daily.   Benicar HCT 40-12.5 MG tablet Generic drug: olmesartan-hydrochlorothiazide TAKE 1 TABLET DAILY   calcium carbonate 600 MG Tabs tablet Commonly known as: OS-CAL Take 600 mg by mouth 2 (two) times daily with a meal.   cholecalciferol 1000 units tablet Commonly known as: VITAMIN D Take 1,000 Units by mouth daily.   desloratadine 5 MG tablet Commonly known as: CLARINEX Take 1 tablet (5 mg total) by mouth daily. As needed.   esomeprazole 40 MG  capsule Commonly known as: NEXIUM Take 1 capsule (40 mg total) by mouth every morning.   famotidine 40 MG tablet Commonly known as: PEPCID Take 1 tablet (40 mg total) by mouth every evening.   fluocinonide cream 0.05 % Commonly known as: LIDEX fluocinonide 0.05 % topical cream   fluticasone 50 MCG/ACT nasal spray Commonly known as: Flonase 1-2 sprays each nostril daily   ibuprofen 600 MG tablet Commonly known as: ADVIL TAKE 1 TABLET EVERY 6 TO 8 HOURS AS NEEDED FOR PAIN   sertraline 100 MG tablet Commonly known as: ZOLOFT 100 mg daily. Take one half daily   triamcinolone cream 0.1 % Commonly known as: KENALOG APPLY TO AFFECTED AREA TWICE A DAY       Past Medical History:  Diagnosis Date  . Anxiety   . Asthma    triggered by allergies; does not use inhaler daily  . Carpal tunnel syndrome of right wrist 11/2011  . Dental crowns present   . Depression   . GERD (gastroesophageal reflux disease)   . Hx of colonic polyps   . Hypertension    under control; has been on med. x 5 yrs.  . Osteopenia   . PONV (postoperative nausea and vomiting)   . Seasonal allergies   . Seizure (HCC) as an infant   x 1 - unknown cause  . Snores    denies apnea  . TMJ disease   . Wears glasses  Past Surgical History:  Procedure Laterality Date  . CARPAL TUNNEL RELEASE  05/08/2007   left  . CARPAL TUNNEL RELEASE  12/12/2011   Procedure: CARPAL TUNNEL RELEASE;  Surgeon: Cammie Sickle., MD;  Location: [facility];  Service: Orthopedics;  Laterality: Right;  . catarac removal right eye Right 05/2019  . [place] SURGERY  2011  . CESAREAN SECTION  10-94   BTL  . CHOLECYSTECTOMY  07/07/2005   lap. chole.  . ESOPHAGEAL DILATION  05/15/2003  . LUMBAR DISC SURGERY  early 1990's  . SPINE SURGERY    . TONSILLECTOMY AND ADENOIDECTOMY  as a child  . TRIGGER FINGER RELEASE Right 02/12/2014   Procedure: RELEASE TRIGGER FINGER/A-1 PULLEY RIGHT INDEX AND RIGHT LONG  FINGERS;  Surgeon: Cammie Sickle, MD;  Location: [facility];  Service: Orthopedics;  Laterality: Right;  . TUBAL LIGATION  1994  . WISDOM TOOTH EXTRACTION      Review of systems negative except as noted in HPI / PMHx or noted below:  Review of Systems  Constitutional: Negative.   HENT: Negative.   Eyes: Negative.   Respiratory: Negative.   Cardiovascular: Negative.   Gastrointestinal: Negative.   Genitourinary: Negative.   Musculoskeletal: Negative.   Skin: Negative.   Neurological: Negative.   Endo/Heme/Allergies: Negative.   Psychiatric/Behavioral: Negative.      Objective:   Vitals:   11/18/19 1154  BP: 124/82  Pulse: 60  Resp: 16  Temp: 97.6 F (36.4 C)  SpO2: 98%          Physical Exam Constitutional:      Appearance: She is not diaphoretic.  HENT:     Head: Normocephalic.     Right Ear: Tympanic membrane, ear canal and external ear normal.     Left Ear: Tympanic membrane, ear canal and external ear normal.     Nose: Nose normal. No mucosal edema or rhinorrhea.     Mouth/Throat:     Pharynx: Uvula midline. No oropharyngeal exudate.  Eyes:     Conjunctiva/sclera: Conjunctivae normal.  Neck:     Thyroid: No thyromegaly.     Trachea: Trachea normal. No tracheal tenderness or tracheal deviation.  Cardiovascular:     Rate and Rhythm: Normal rate and regular rhythm.     Heart sounds: S1 normal and S2 normal. Murmur (Systolic) present.  Pulmonary:     Effort: No respiratory distress.     Breath sounds: Normal breath sounds. No stridor. No wheezing or rales.  Lymphadenopathy:     Head:     Right side of head: No tonsillar adenopathy.     Left side of head: No tonsillar adenopathy.     Cervical: No cervical adenopathy.  Skin:    Findings: No erythema or rash.     Nails: There is no clubbing.  Neurological:     Mental Status: She is alert.     Diagnostics:    Spirometry was performed and demonstrated an FEV1 of 1.62 at 77 % of  predicted.  The patient had an Asthma Control Test with the following results: ACT Total Score: 23.    Assessment and Plan:   1. Asthma, well controlled, mild persistent   2. Perennial allergic rhinitis   3. Seasonal allergic rhinitis due to pollen   4. LPRD (laryngopharyngeal reflux disease)     1.  Continue to perform allergen avoidance measures - dust mite / pollens  2.  Continue to treat and prevent infalammation:  A.  Arnuity 200-1 inhalation 1 time per day  B.  Flonase-1-2 sprays each nostril 1 time per day  3.  Continue to treat and prevent reflux:   A.  Consolidate caffeine consumption  B.  Nexium 40 mg tablet in a.m.  C.  famotidine 40 mg tablet in p.m.  4. If needed:   A.  Albuterol HFA-2 inhalations every 4-6 hours  B.  antihistamine- Clarinex 5 mg 1 time per day    C.  OTC nasal saline  D.  OTC Systane eyedrops   5. Return to clinic in 6 months or earlier if problem  7. Obtain COVID vaccine  [name] has really done very well on her current therapy and at this point in time and she will remain on a combination of anti-inflammatory agents for her airway and therapy directed against reflux and I will see her back in his clinic in 6 months or earlier if there is a problem.  Laurette Schimke, MD Allergy / Immunology [facility]

## 2019-11-19 ENCOUNTER — Encounter: Payer: Self-pay | Admitting: Allergy and Immunology

## 2020-05-16 ENCOUNTER — Other Ambulatory Visit: Payer: Self-pay | Admitting: Allergy and Immunology

## 2020-05-25 ENCOUNTER — Other Ambulatory Visit: Payer: Self-pay

## 2020-05-25 ENCOUNTER — Encounter: Payer: Self-pay | Admitting: Allergy and Immunology

## 2020-05-25 ENCOUNTER — Ambulatory Visit (INDEPENDENT_AMBULATORY_CARE_PROVIDER_SITE_OTHER): Payer: Medicare Other | Admitting: Allergy and Immunology

## 2020-05-25 VITALS — BP 112/60 | HR 60 | Resp 16

## 2020-05-25 DIAGNOSIS — J3089 Other allergic rhinitis: Secondary | ICD-10-CM | POA: Diagnosis not present

## 2020-05-25 DIAGNOSIS — J4530 Mild persistent asthma, uncomplicated: Secondary | ICD-10-CM

## 2020-05-25 DIAGNOSIS — J301 Allergic rhinitis due to pollen: Secondary | ICD-10-CM

## 2020-05-25 DIAGNOSIS — K219 Gastro-esophageal reflux disease without esophagitis: Secondary | ICD-10-CM

## 2020-05-25 NOTE — Progress Notes (Signed)
[place] - [place] - [place] - [place] - [place]   Follow-up Note  Referring Provider: Alysia Penna, MD Primary Provider: Alysia Penna, MD Date of Office Visit: 05/25/2020  Subjective:   Taylor Stanley (DOB: [birth year]) is a 64 y.o. female who [facility] in re-evaluation of the following:  HPI: Taylor Stanley returns to this clinic in evaluation of asthma and allergic rhinitis and LPR.  Her last visit to this clinic was 18 November 2019.  Once again she has had an excellent interval or time regarding her respiratory tract.  She has not required a systemic steroid or an antibiotic for any type of airway issue.  She rarely utilizes any short acting bronchodilators certainly less than 1 time per week.  She has had very little issues with her nose.  She has had no issues with her throat or with reflux.  She has obtained two Pfizer Covid vaccines.  Allergies as of 05/25/2020      Reactions   Penicillins Hives, Swelling      Medication List      albuterol 108 (90 Base) MCG/ACT inhaler Commonly known as: Proventil HFA Inhale 2 puffs into the lungs every 4 (four) hours as needed.   Arnuity Ellipta 200 MCG/ACT Aepb Generic drug: Fluticasone Furoate TAKE 1 PUFF BY MOUTH EVERY DAY   aspirin 81 MG tablet Take 81 mg by mouth daily.   Benicar HCT 40-12.5 MG tablet Generic drug: olmesartan-hydrochlorothiazide TAKE 1 TABLET DAILY   calcium carbonate 600 MG Tabs tablet Commonly known as: OS-CAL Take 600 mg by mouth 2 (two) times daily with a meal.   cholecalciferol 1000 units tablet Commonly known as: VITAMIN D Take 1,000 Units by mouth daily.   desloratadine 5 MG tablet Commonly known as: CLARINEX Take 1 tablet (5 mg total) by mouth daily. As needed.   esomeprazole 40 MG capsule Commonly known as: NEXIUM Take 1 capsule (40 mg total) by mouth every morning.   famotidine 40 MG tablet Commonly known as: PEPCID Take  1 tablet (40 mg total) by mouth every evening.   fluocinonide cream 0.05 % Commonly known as: LIDEX fluocinonide 0.05 % topical cream   fluticasone 50 MCG/ACT nasal spray Commonly known as: Flonase 1-2 sprays each nostril daily   ibuprofen 600 MG tablet Commonly known as: ADVIL TAKE 1 TABLET EVERY 6 TO 8 HOURS AS NEEDED FOR PAIN   sertraline 100 MG tablet Commonly known as: ZOLOFT 100 mg daily. Take one half daily   SIMPONI ARIA IV Inject into the vein.   triamcinolone cream 0.1 % Commonly known as: KENALOG APPLY TO AFFECTED AREA TWICE A DAY       Past Medical History:  Diagnosis Date  . Anxiety   . Asthma    triggered by allergies; does not use inhaler daily  . Carpal tunnel syndrome of right wrist 11/2011  . Dental crowns present   . Depression   . GERD (gastroesophageal reflux disease)   . Hx of colonic polyps   . Hypertension    under control; has been on med. x 5 yrs.  . Osteopenia   . PONV (postoperative nausea and vomiting)   . Seasonal allergies   . Seizure (HCC) as an infant   x 1 - unknown cause  . Snores    denies apnea  . TMJ disease   . Wears glasses     Past Surgical History:  Procedure Laterality Date  . CARPAL TUNNEL  RELEASE  05/08/2007   left  . CARPAL TUNNEL RELEASE  12/12/2011   Procedure: CARPAL TUNNEL RELEASE;  Surgeon: Wyn Forster., MD;  Location: [facility];  Service: Orthopedics;  Laterality: Right;  . catarac removal right eye Right 05/2019  . CERVICAL DISC SURGERY  2011  . CESAREAN SECTION  10-94   BTL  . CHOLECYSTECTOMY  07/07/2005   lap. chole.  . ESOPHAGEAL DILATION  05/15/2003  . LUMBAR DISC SURGERY  early 1990's  . SPINE SURGERY    . TONSILLECTOMY AND ADENOIDECTOMY  as a child  . TRIGGER FINGER RELEASE Right 02/12/2014   Procedure: RELEASE TRIGGER FINGER/A-1 PULLEY RIGHT INDEX AND RIGHT LONG FINGERS;  Surgeon: Wyn Forster, MD;  Location: [facility];  Service: Orthopedics;   Laterality: Right;  . TUBAL LIGATION  1994  . WISDOM TOOTH EXTRACTION      Review of systems negative except as noted in HPI / PMHx or noted below:  Review of Systems  Constitutional: Negative.   HENT: Negative.   Eyes: Negative.   Respiratory: Negative.   Cardiovascular: Negative.   Gastrointestinal: Negative.   Genitourinary: Negative.   Musculoskeletal: Negative.   Skin: Negative.   Neurological: Negative.   Endo/Heme/Allergies: Negative.   Psychiatric/Behavioral: Negative.      Objective:   Vitals:   05/25/20 1114  BP: 112/60  Pulse: 60  Resp: 16  SpO2: 95%          Physical Exam Constitutional:      Appearance: She is not diaphoretic.  HENT:     Head: Normocephalic.     Right Ear: Tympanic membrane, ear canal and external ear normal.     Left Ear: Tympanic membrane, ear canal and external ear normal.     Nose: Nose normal. No mucosal edema or rhinorrhea.     Mouth/Throat:     Pharynx: Uvula midline. No oropharyngeal exudate.  Eyes:     Conjunctiva/sclera: Conjunctivae normal.  Neck:     Thyroid: No thyromegaly.     Trachea: Trachea normal. No tracheal tenderness or tracheal deviation.  Cardiovascular:     Rate and Rhythm: Normal rate and regular rhythm.     Heart sounds: S1 normal and S2 normal. Murmur (Systolic) heard.   Pulmonary:     Effort: No respiratory distress.     Breath sounds: Normal breath sounds. No stridor. No wheezing or rales.  Lymphadenopathy:     Head:     Right side of head: No tonsillar adenopathy.     Left side of head: No tonsillar adenopathy.     Cervical: No cervical adenopathy.  Skin:    Findings: No erythema or rash.     Nails: There is no clubbing.  Neurological:     Mental Status: She is alert.     Diagnostics:    Spirometry was performed and demonstrated an FEV1 of 1.59 at 75 % of predicted.  Assessment and Plan:   1. Asthma, well controlled, mild persistent   2. Perennial allergic rhinitis   3. Seasonal  allergic rhinitis due to pollen   4. LPRD (laryngopharyngeal reflux disease)     1.  Continue to perform allergen avoidance measures - dust mite / pollens  2.  Continue to treat and prevent infalammation:   A.  Arnuity 200-1 inhalation 1 time per day  B.  Flonase-1-2 sprays each nostril 1-7 times per week  3.  Continue to treat and prevent reflux:   A.  Consolidate caffeine consumption  B.  Nexium 40 mg tablet in a.m.  C.  famotidine 40 mg tablet in p.m. IF NEEDED  4. If needed:   A.  Albuterol HFA-2 inhalations every 4-6 hours  B.  antihistamine- Clarinex 5 mg 1 time per day    C.  OTC nasal saline  D.  OTC Systane eyedrops   5. Return to clinic in 12 months or earlier if problem  6. Obtain COVID booster vaccine and flu vaccine  [name] has really done very well on her current plan of therapy and she will continue on anti-inflammatory agents for both her upper and lower airway.  We will see if we can consolidate her reflux treatment by having her use famotidine just as needed while still continuing to use a proton pump inhibitor consistently.  Assuming she does well on this plan I will see her back in this clinic in 1 year or earlier if there is a problem.  Laurette Schimke, MD Allergy / Immunology [facility]

## 2020-05-25 NOTE — Patient Instructions (Addendum)
  1.  Continue to perform allergen avoidance measures - dust mite / pollens  2.  Continue to treat and prevent infalammation:   A.  Arnuity 200-1 inhalation 1 time per day  B.  Flonase-1-2 sprays each nostril 1-7 times per week  3.  Continue to treat and prevent reflux:   A.  Consolidate caffeine consumption  B.  Nexium 40 mg tablet in a.m.  C.  famotidine 40 mg tablet in p.m. IF NEEDED  4. If needed:   A.  Albuterol HFA-2 inhalations every 4-6 hours  B.  antihistamine- Clarinex 5 mg 1 time per day    C.  OTC nasal saline  D.  OTC Systane eyedrops   5. Return to clinic in 12 months or earlier if problem  6. Obtain COVID booster vaccine and flu vaccine

## 2020-05-26 ENCOUNTER — Encounter: Payer: Self-pay | Admitting: Allergy and Immunology

## 2020-06-29 ENCOUNTER — Other Ambulatory Visit: Payer: Self-pay | Admitting: Allergy and Immunology

## 2020-08-11 ENCOUNTER — Other Ambulatory Visit: Payer: Self-pay | Admitting: Allergy and Immunology

## 2020-09-08 ENCOUNTER — Telehealth: Payer: Self-pay

## 2020-09-08 NOTE — Telephone Encounter (Signed)
PA forms were completed and faxed to [phone] determination decision will be faxed back to [place] in a timely manner

## 2020-09-09 NOTE — Telephone Encounter (Signed)
PA for Arnuity Ellipta 200 mcg was denied will fill out paperwork for appeal

## 2020-09-10 NOTE — Telephone Encounter (Signed)
Paperwork has been filled out and has been faxed back to insurance. Forms are in the pending tray in the [place] office.

## 2020-09-16 NOTE — Telephone Encounter (Signed)
PA Arnuity was meant for Co-pay to be reduced and cost of medication. Patient is covered for Arnuity and will have to pay what the cost is. Denial letter will be faxed to our office to let us know what is the cheapest option if patient decides to go with that.

## 2020-09-16 NOTE — Telephone Encounter (Signed)
No further requirements needed

## 2020-10-12 ENCOUNTER — Other Ambulatory Visit: Payer: Self-pay | Admitting: Allergy and Immunology

## 2020-12-26 ENCOUNTER — Other Ambulatory Visit: Payer: Self-pay | Admitting: Allergy and Immunology

## 2021-02-19 ENCOUNTER — Other Ambulatory Visit: Payer: Self-pay | Admitting: Allergy and Immunology

## 2021-05-17 ENCOUNTER — Other Ambulatory Visit: Payer: Self-pay

## 2021-05-17 ENCOUNTER — Encounter: Payer: Self-pay | Admitting: Allergy and Immunology

## 2021-05-17 ENCOUNTER — Ambulatory Visit (INDEPENDENT_AMBULATORY_CARE_PROVIDER_SITE_OTHER): Payer: Medicare Other | Admitting: Allergy and Immunology

## 2021-05-17 VITALS — BP 154/80 | HR 65 | Temp 97.6°F | Resp 16 | Ht 60.5 in | Wt 159.4 lb

## 2021-05-17 DIAGNOSIS — K219 Gastro-esophageal reflux disease without esophagitis: Secondary | ICD-10-CM | POA: Diagnosis not present

## 2021-05-17 DIAGNOSIS — D849 Immunodeficiency, unspecified: Secondary | ICD-10-CM

## 2021-05-17 DIAGNOSIS — J4530 Mild persistent asthma, uncomplicated: Secondary | ICD-10-CM

## 2021-05-17 DIAGNOSIS — I351 Nonrheumatic aortic (valve) insufficiency: Secondary | ICD-10-CM

## 2021-05-17 DIAGNOSIS — R931 Abnormal findings on diagnostic imaging of heart and coronary circulation: Secondary | ICD-10-CM

## 2021-05-17 DIAGNOSIS — J3089 Other allergic rhinitis: Secondary | ICD-10-CM

## 2021-05-17 DIAGNOSIS — J301 Allergic rhinitis due to pollen: Secondary | ICD-10-CM

## 2021-05-17 DIAGNOSIS — R011 Cardiac murmur, unspecified: Secondary | ICD-10-CM

## 2021-05-17 MED ORDER — LEVOCETIRIZINE DIHYDROCHLORIDE 5 MG PO TABS: 5.0000 mg | ORAL_TABLET | Freq: Every evening | ORAL | 11 refills | Status: DC

## 2021-05-17 MED ORDER — ESOMEPRAZOLE MAGNESIUM 40 MG PO CPDR: DELAYED_RELEASE_CAPSULE | ORAL | 3 refills | Status: DC

## 2021-05-17 MED ORDER — ARNUITY ELLIPTA 200 MCG/ACT IN AEPB: INHALATION_SPRAY | RESPIRATORY_TRACT | 11 refills | Status: DC

## 2021-05-17 MED ORDER — FLUTICASONE PROPIONATE 50 MCG/ACT NA SUSP: NASAL | 11 refills | Status: DC

## 2021-05-17 MED ORDER — ALBUTEROL SULFATE HFA 108 (90 BASE) MCG/ACT IN AERS: INHALATION_SPRAY | RESPIRATORY_TRACT | 3 refills | Status: DC

## 2021-05-17 NOTE — Patient Instructions (Addendum)
  1.  Continue to perform allergen avoidance measures - dust mite / pollens  2.  Continue to treat and prevent infalammation:   A.  Arnuity 200-1 inhalation 1 time per day  B.  Flonase-1-2 sprays each nostril 1-7 times per week  3.  Continue to treat and prevent reflux:   A.  Consolidate caffeine consumption  B.  Nexium 40 mg tablet in a.m.  4. If needed:   A.  Albuterol HFA-2 inhalations every 4-6 hours  B.  antihistamine- Clarinex / Claritin / Allegra / Xyzal / Zyrtec  C.  OTC nasal saline  D.  OTC Systane eyedrops  E.  famotidine 40 mg tablet in p.m.    5. Return to clinic in 12 months or earlier if problem  6.  Obtain fall flu vaccine

## 2021-05-17 NOTE — Progress Notes (Signed)
[place] - [place] - [place] - [place] - [place]   Follow-up Note  Referring Provider: Alysia Penna, MD Primary Provider: Alysia Penna, MD Date of Office Visit: 05/17/2021  Subjective:   Taylor Stanley (DOB: 1955/12/12) is a 65 y.o. female who [facility] 05[facility]17/2021 in re-evaluation of the following:  HPI: Taylor Stanley returns to this clinic in evaluation of asthma, allergic rhinitis, and LPR.  Her last visit to this clinic was 25 May 2020.  She had a very good year regarding her respiratory tract without the need for systemic steroid or antibiotic.  Her requirement for short acting bronchodilator is 1 time per week.  She has been able to exert herself without too much problem.  This summer she visited [place] and did pretty well at 13[place]000 [place].  She still has some issues with some nasal congestion and sneezing and some occasional ear fullness.  She does not use her nasal steroid very often but relies on the use of a antihistamine occasionally.  She contracted COVID this summer with involvement of her head and chest and ears for which she received systemic steroids and after about 2 weeks her infection basically resolved.  She has had no long-term sequela.  She continues on immunosuppression with anti-TNF antibody for her rheumatoid arthritis which is under good control with an infusion every 2 months.  She has received 2 Pfizer COVID vaccines and as noted above was infected with COVID summer 2022.  Allergies as of 05/17/2021       Reactions   Penicillins Hives, Swelling        Medication List    albuterol 108 (90 Base) MCG/ACT inhaler Commonly known as: VENTOLIN HFA TAKE 2 PUFFS BY MOUTH EVERY 4 HOURS AS NEEDED   Arnuity Ellipta 200 MCG/ACT Aepb Generic drug: Fluticasone Furoate INHALE 1 PUFF BY MOUTH EVERY DAY   aspirin 81 MG tablet Take 81 mg by mouth daily.   Benicar HCT 40-12.5 MG tablet Generic drug:  olmesartan-hydrochlorothiazide TAKE 1 TABLET DAILY   calcium carbonate 600 MG Tabs tablet Commonly known as: OS-CAL Take 600 mg by mouth 2 (two) times daily with a meal.   cholecalciferol 1000 units tablet Commonly known as: VITAMIN D Take 1,000 Units by mouth daily.   desloratadine 5 MG tablet Commonly known as: CLARINEX TAKE 1 TABLET (5 MG TOTAL) BY MOUTH DAILY. AS NEEDED.   esomeprazole 40 MG capsule Commonly known as: NEXIUM TAKE 1 CAPSULE BY MOUTH EVERY DAY IN THE MORNING   fluocinonide cream 0.05 % Commonly known as: LIDEX fluocinonide 0.05 % topical cream   fluticasone 50 MCG/ACT nasal spray Commonly known as: FLONASE SPRAY 1-2 SPRAYS INTO EACH NOSTRIL EVERY DAY   ibuprofen 600 MG tablet Commonly known as: ADVIL TAKE 1 TABLET EVERY 6 TO 8 HOURS AS NEEDED FOR PAIN   sertraline 100 MG tablet Commonly known as: ZOLOFT 100 mg daily. Take one half daily   SIMPONI ARIA IV Inject into the vein.   triamcinolone cream 0.1 % Commonly known as: KENALOG APPLY TO AFFECTED AREA TWICE A DAY    Past Medical History:  Diagnosis Date   Anxiety    Asthma    triggered by allergies; does not use inhaler daily   Carpal tunnel syndrome of right wrist 11/2011   Dental crowns present    Depression    GERD (gastroesophageal reflux disease)    Hx of colonic polyps    Hypertension    under  control; has been on med. x 5 yrs.   Osteopenia    PONV (postoperative nausea and vomiting)    Seasonal allergies    Seizure (HCC) as an infant   x 1 - unknown cause   Snores    denies apnea   TMJ disease    Wears glasses     Past Surgical History:  Procedure Laterality Date   CARPAL TUNNEL RELEASE  05/08/2007   left   CARPAL TUNNEL RELEASE  12/12/2011   Procedure: CARPAL TUNNEL RELEASE;  Surgeon: Wyn Forster., MD;  Location: [facility];  Service: Orthopedics;  Laterality: Right;   catarac removal right eye Right 05/2019   CERVICAL DISC SURGERY  2011    CESAREAN SECTION  10-94   BTL   CHOLECYSTECTOMY  07/07/2005   lap. chole.   ESOPHAGEAL DILATION  05/15/2003   LUMBAR DISC SURGERY  early 1990's   SPINE SURGERY     TONSILLECTOMY AND ADENOIDECTOMY  as a child   TRIGGER FINGER RELEASE Right 02/12/2014   Procedure: RELEASE TRIGGER FINGER/A-1 PULLEY RIGHT INDEX AND RIGHT LONG FINGERS;  Surgeon: Wyn Forster, MD;  Location: [facility];  Service: Orthopedics;  Laterality: Right;   TUBAL LIGATION  1994   WISDOM TOOTH EXTRACTION      Review of systems negative except as noted in HPI / PMHx or noted below:  Review of Systems  Constitutional: Negative.   HENT: Negative.    Eyes: Negative.   Respiratory: Negative.    Cardiovascular: Negative.   Gastrointestinal: Negative.   Genitourinary: Negative.   Musculoskeletal: Negative.   Skin: Negative.   Neurological: Negative.   Endo/Heme/Allergies: Negative.   Psychiatric/Behavioral: Negative.      Objective:   Vitals:   05/17/21 1134  BP: (!) 154/80  Pulse: 65  Resp: 16  Temp: 97.6 F (36.4 C)  SpO2: 98%   Height: 5' 0.5" (153.7 cm)  Weight: 159 lb 6.4 oz (72.3 kg)   Physical Exam Constitutional:      Appearance: She is not diaphoretic.  HENT:     Head: Normocephalic.     Right Ear: Tympanic membrane, ear canal and external ear normal.     Left Ear: Tympanic membrane, ear canal and external ear normal.     Nose: Nose normal. No mucosal edema or rhinorrhea.     Mouth/Throat:     Pharynx: Uvula midline. No oropharyngeal exudate.  Eyes:     Conjunctiva/sclera: Conjunctivae normal.  Neck:     Thyroid: No thyromegaly.     Trachea: Trachea normal. No tracheal tenderness or tracheal deviation.  Cardiovascular:     Rate and Rhythm: Normal rate and regular rhythm.     Heart sounds: S1 normal and S2 normal. Murmur (Prolonged systolic) heard.  Pulmonary:     Effort: No respiratory distress.     Breath sounds: Normal breath sounds. No stridor. No wheezing or  rales.  Lymphadenopathy:     Head:     Right side of head: No tonsillar adenopathy.     Left side of head: No tonsillar adenopathy.     Cervical: No cervical adenopathy.  Skin:    Findings: No erythema or rash.     Nails: There is no clubbing.  Neurological:     Mental Status: She is alert.    Diagnostics:    Spirometry was performed and demonstrated an FEV1 of 1.49 at 72 % of predicted.  The patient had an Asthma Control  Test with the following results: ACT Total Score: 22.    Results of an echocardiogram obtained 25 February 2019 identified the following:  1. The left ventricle has normal systolic function with an ejection  fraction of 60-65%. The cavity size was normal. Left ventricular diastolic  Doppler parameters are consistent with impaired relaxation. No evidence of  left ventricular regional wall  motion abnormalities.   2. The right ventricle has normal systolic function. The cavity was  normal. There is no increase in right ventricular wall thickness.   3. The aortic valve is tricuspid. Sclerosis without any evidence of  stenosis of the aortic valve. Aortic valve regurgitation is mild by color  flow Doppler.   4. The aortic valve is mildly calcified and the LCC and NCC seem to be  partially fused.   5. The aorta is normal in size and structure.   Assessment and Plan:   1. Asthma, well controlled, mild persistent   2. Perennial allergic rhinitis   3. Seasonal allergic rhinitis due to pollen   4. LPRD (laryngopharyngeal reflux disease)   5. Immunosuppression (HCC)   6. Murmur     1.  Continue to perform allergen avoidance measures - dust mite / pollens  2.  Continue to treat and prevent infalammation:   A.  Arnuity 200-1 inhalation 1 time per day  B.  Flonase-1-2 sprays each nostril 1-7 times per week  3.  Continue to treat and prevent reflux:   A.  Consolidate caffeine consumption  B.  Nexium 40 mg tablet in a.m.  4. If needed:   A.  Albuterol HFA-2  inhalations every 4-6 hours  B.  antihistamine- Clarinex / Claritin / Allegra / Xyzal / Zyrtec  C.  OTC nasal saline  D.  OTC Systane eyedrops  E.  famotidine 40 mg tablet in p.m.    5. Return to clinic in 12 months or earlier if problem  6.  Obtain fall flu vaccine  7.  Obtain follow-up echocardiogram  [name] appears to be doing relatively well on her current therapy although she could use a little bit more anti-inflammatory medication for her upper airway and I recommended that she use her Flonase a little more consistent than 1 time per month.  She will remain on inhaled steroids and continue to treat her reflux/LPR as noted above.  We will see her back in this clinic in 1 year or earlier if there is a problem.  She will obtain a follow-up echocardiogram for her murmur.  Laurette Schimke, MD Allergy / Immunology [facility]

## 2021-05-18 ENCOUNTER — Encounter: Payer: Self-pay | Admitting: Allergy and Immunology

## 2021-05-18 ENCOUNTER — Telehealth: Payer: Self-pay | Admitting: *Deleted

## 2021-05-18 NOTE — Telephone Encounter (Signed)
Order has been placed for a 2D Echo. Called and left a voicemail with [facility] giving the patients information so that they can call her to schedule the echo with her. I called the patient and informed her of Dr. Johney Maine recommendation and orders and provided her with the phone number for [facility] so that she can call tomorrow if she wishes to try and get in touch with [facility] to get the 2D Echo scheduled for herself. Patient verbalized understanding.

## 2021-05-18 NOTE — Telephone Encounter (Signed)
-----   Message from Jessica Priest, MD sent at 05/18/2021  7:12 AM EDT ----- Please arrange for Taylor Stanley to obtain a 2D echo with Doppler for abnormal echocardiogram and aortic regurgitation/sclerosis.  And please let her know that her last echocardiogram was 2 years ago so it is time to repeat this study.

## 2021-05-18 NOTE — Addendum Note (Signed)
Addended by: Dollene Cleveland R on: 05/18/2021 04:43 PM   Modules accepted: Orders

## 2021-05-30 ENCOUNTER — Other Ambulatory Visit: Payer: Self-pay | Admitting: Allergy and Immunology

## 2021-06-28 ENCOUNTER — Ambulatory Visit (HOSPITAL_COMMUNITY)
Admission: RE | Admit: 2021-06-28 | Discharge: 2021-06-28 | Disposition: A | Discharge status: Home / Self Care | Payer: Medicare Other | Source: Ambulatory Visit | Attending: Allergy and Immunology | Admitting: Allergy and Immunology

## 2021-06-28 DIAGNOSIS — R9431 Abnormal electrocardiogram [ECG] [EKG]: Secondary | ICD-10-CM | POA: Insufficient documentation

## 2021-06-28 DIAGNOSIS — I352 Nonrheumatic aortic (valve) stenosis with insufficiency: Secondary | ICD-10-CM | POA: Diagnosis not present

## 2021-06-28 DIAGNOSIS — I350 Nonrheumatic aortic (valve) stenosis: Secondary | ICD-10-CM | POA: Diagnosis not present

## 2021-06-28 DIAGNOSIS — I10 Essential (primary) hypertension: Secondary | ICD-10-CM | POA: Diagnosis not present

## 2021-06-28 DIAGNOSIS — I351 Nonrheumatic aortic (valve) insufficiency: Secondary | ICD-10-CM | POA: Diagnosis not present

## 2021-06-28 LAB — ECHOCARDIOGRAM COMPLETE
AR max vel: 1.41 cm2
AV Area VTI: 1.31 cm2
AV Area mean vel: 1.29 cm2
AV Mean grad: 13 mmHg
AV Peak grad: 24 mmHg
Ao pk vel: 2.45 m/s
Area-P 1/2: 2.73 cm2
P 1/2 time: 567 msec
S' Lateral: 2.2 cm

## 2021-06-28 NOTE — Progress Notes (Signed)
Echocardiogram 2D Echocardiogram has been performed.  Leta Jungling M 06/28/2021, 2:44 PM

## 2021-11-14 ENCOUNTER — Other Ambulatory Visit: Payer: Self-pay | Admitting: Allergy and Immunology

## 2021-11-29 ENCOUNTER — Other Ambulatory Visit: Payer: Self-pay | Admitting: *Deleted

## 2021-11-29 DIAGNOSIS — Z122 Encounter for screening for malignant neoplasm of respiratory organs: Secondary | ICD-10-CM

## 2021-12-08 ENCOUNTER — Inpatient Hospital Stay: Admission: RE | Admit: 2021-12-08 | Payer: Medicare Other | Source: Ambulatory Visit

## 2021-12-12 ENCOUNTER — Ambulatory Visit: Payer: Medicare Other | Admitting: Cardiology

## 2021-12-12 ENCOUNTER — Encounter: Payer: Self-pay | Admitting: Cardiology

## 2021-12-12 VITALS — BP 132/68 | HR 64 | Temp 97.1°F | Resp 16 | Ht 60.0 in | Wt 159.0 lb

## 2021-12-12 DIAGNOSIS — R0789 Other chest pain: Secondary | ICD-10-CM | POA: Insufficient documentation

## 2021-12-12 DIAGNOSIS — Z8249 Family history of ischemic heart disease and other diseases of the circulatory system: Secondary | ICD-10-CM

## 2021-12-12 DIAGNOSIS — I10 Essential (primary) hypertension: Secondary | ICD-10-CM

## 2021-12-12 DIAGNOSIS — I350 Nonrheumatic aortic (valve) stenosis: Secondary | ICD-10-CM

## 2021-12-12 DIAGNOSIS — R1013 Epigastric pain: Secondary | ICD-10-CM

## 2021-12-12 NOTE — Progress Notes (Signed)
? ? ?Patient referred by Velna Hatchet, MD for murmur ? ?Subjective:  ? ?Taylor Stanley, female    DOB: 1956/07/24, 66 y.o.   MRN: [id number] ? ? ?No chief complaint on file. ? ? ? ?HPI ? ?66 y.o. Caucasian female with hypertension, family h/o AAA w/rupture, referred for cardiac murmur. ? ?Recent echocardiogram showed mild aortic stenosis. Patient stays active with yardwork etc. She has recently noticed back and epigastric/lower chest pain, sometimes with activity. She takes Aspirin 81 mg daily, as well as ibuprofen for this pain. Patient is concerned given her family h/o AAA rupture in her mother.  ? ?Past Medical History:  ?Diagnosis Date  ? Anxiety   ? Asthma   ? triggered by allergies; does not use inhaler daily  ? Carpal tunnel syndrome of right wrist 11/2011  ? Dental crowns present   ? Depression   ? GERD (gastroesophageal reflux disease)   ? Hx of colonic polyps   ? Hypertension   ? under control; has been on med. x 5 yrs.  ? Osteopenia   ? PONV (postoperative nausea and vomiting)   ? Seasonal allergies   ? Seizure [facility]) as an infant  ? x 1 - unknown cause  ? Snores   ? denies apnea  ? TMJ disease   ? Wears glasses   ? ? ? ?Past Surgical History:  ?Procedure Laterality Date  ? CARPAL TUNNEL RELEASE  05/08/2007  ? left  ? CARPAL TUNNEL RELEASE  12/12/2011  ? Procedure: CARPAL TUNNEL RELEASE;  Surgeon: Cammie Sickle., MD;  Location: [place];  Service: Orthopedics;  Laterality: Right;  ? catarac removal right eye Right 05/2019  ? [place] SURGERY  2011  ? CESAREAN SECTION  10-94  ? BTL  ? CHOLECYSTECTOMY  07/07/2005  ? lap. chole.  ? ESOPHAGEAL DILATION  05/15/2003  ? LUMBAR DISC SURGERY  early 1990's  ? SPINE SURGERY    ? TONSILLECTOMY AND ADENOIDECTOMY  as a child  ? TRIGGER FINGER RELEASE Right 02/12/2014  ? Procedure: RELEASE TRIGGER FINGER/A-1 PULLEY RIGHT INDEX AND RIGHT LONG FINGERS;  Surgeon: Cammie Sickle, MD;  Location: [place];  Service: Orthopedics;   Laterality: Right;  ? TUBAL LIGATION  1994  ? WISDOM TOOTH EXTRACTION    ? ? ? ?Social History  ? ?Tobacco Use  ?Smoking Status Former  ? Types: Cigarettes  ? Quit date: 02/10/2005  ? Years since quitting: 16.8  ?Smokeless Tobacco Never  ? ? ?Social History  ? ?Substance and Sexual Activity  ?Alcohol Use No  ? ? ? ?Family History  ?Problem Relation Age of Onset  ? Colon cancer Mother   ? Obesity Mother   ? Diabetes Mother   ? Deep vein thrombosis Mother   ? Hypertension Mother   ?     pacemaker  ? Emphysema Mother   ? Thyroid disease Mother   ? Allergic rhinitis Mother   ? Asthma Mother   ? Hypertension Father   ?     pacemaker  ? Alzheimer's disease Father   ? Hypothyroidism Father   ? Allergic rhinitis Father   ? Diabetes Sister   ? Obesity Sister   ? Hypertension Sister   ? COPD Sister   ? Hypothyroidism Sister   ? Thyroid disease Sister   ? Fibromyalgia Sister   ? Bipolar disorder Sister   ? Hypothyroidism Sister   ? Suicidality Sister   ? Allergic rhinitis Brother   ?  Angioedema Neg Hx   ? Atopy Neg Hx   ? Eczema Neg Hx   ? Immunodeficiency Neg Hx   ? Urticaria Neg Hx   ? ? ? ? ?Current Outpatient Medications:  ?  albuterol (VENTOLIN HFA) 108 (90 Base) MCG/ACT inhaler, TAKE 2 PUFFS BY MOUTH EVERY 4 HOURS AS NEEDED, Disp: 18 g, Rfl: 3 ?  aspirin 81 MG tablet, Take 81 mg by mouth daily., Disp: , Rfl:  ?  BENICAR HCT 40-12.5 MG per tablet, TAKE 1 TABLET DAILY, Disp: 90 tablet, Rfl: 1 ?  calcium carbonate (OS-CAL) 600 MG TABS, Take 600 mg by mouth 2 (two) times daily with a meal., Disp: , Rfl:  ?  cholecalciferol (VITAMIN D) 1000 UNITS tablet, Take 1,000 Units by mouth daily., Disp: , Rfl:  ?  desloratadine (CLARINEX) 5 MG tablet, TAKE ONE TABLET BY MOUTH DAILY AS NEEDED, Disp: 90 tablet, Rfl: 1 ?  esomeprazole (NEXIUM) 40 MG capsule, TAKE 1 CAPSULE BY MOUTH EVERY DAY IN THE MORNING, Disp: 90 capsule, Rfl: 3 ?  fluocinonide cream (LIDEX) 0.05 %, fluocinonide 0.05 % topical cream, Disp: , Rfl:  ?  fluticasone  (FLONASE) 50 MCG/ACT nasal spray, SPRAY 1-2 SPRAYS INTO EACH NOSTRIL 1-7 times per week., Disp: 16 mL, Rfl: 11 ?  Fluticasone Furoate (ARNUITY ELLIPTA) 200 MCG/ACT AEPB, INHALE 1 PUFF BY MOUTH EVERY DAY, Disp: 30 each, Rfl: 11 ?  Golimumab (SIMPONI ARIA IV), Inject into the vein., Disp: , Rfl:  ?  ibuprofen (ADVIL) 600 MG tablet, TAKE 1 TABLET EVERY 6 TO 8 HOURS AS NEEDED FOR PAIN, Disp: , Rfl:  ?  levocetirizine (XYZAL) 5 MG tablet, Take 1 tablet (5 mg total) by mouth every evening., Disp: 30 tablet, Rfl: 11 ?  sertraline (ZOLOFT) 100 MG tablet, 100 mg daily. Take one half daily, Disp: , Rfl:  ?  triamcinolone cream (KENALOG) 0.1 %, APPLY TO AFFECTED AREA TWICE A DAY, Disp: , Rfl:  ? ? ?Cardiovascular and other pertinent studies: ? ?Reviewed external labs and tests, independently interpreted ? ?EKG 12/12/2021: ?Sinus rhythm 60 bpm ?Normal EKG ? ?Echocardiogram 06/28/2021: ? 1. Left ventricular ejection fraction, by estimation, is 60 to 65%. The  ?left ventricle has normal function. The left ventricle has no regional  ?wall motion abnormalities. There is mild left ventricular hypertrophy.  ?Left ventricular diastolic parameters  ?are consistent with Grade I diastolic dysfunction (impaired relaxation).  ? 2. Right ventricular systolic function is normal. The right ventricular  ?size is normal.  ? 3. The mitral valve is grossly normal. Trivial mitral valve  ?regurgitation.  ? 4. The aortic valve is tricuspid. Aortic valve regurgitation is mild.  ?Mild aortic valve stenosis. Aortic valve area, by VTI measures 1.31 cm?Marland Kitchen  ?Aortic valve mean gradient measures 13.0 mmHg. Aortic valve Vmax measures  ?2.45 m/s.  ? 5. The inferior vena cava is normal in size with greater than 50%  ?respiratory variability, suggesting right atrial pressure of 3 mmHg.  ? ?Comparison(s): Changes from prior study are noted. 02/25/2019: LVEF 60-65%,  ?no AS, mild AI.  ? ? ?Recent labs: ?08/24/2021: ?Glucose 998, BUN/Cr 14/0.82. EGFR 71. ?HbA1C  5.3% ? ?12/2020: ?Chol 179, TG 136, HDL 37, LDL 115 ?TSH 1.8 normal ? ? ?Review of Systems  ?Cardiovascular:  Positive for chest pain. Negative for dyspnea on exertion, leg swelling, palpitations and syncope.  ? ?   ? ? ?Vitals:  ? 12/12/21 0951  ?BP: 132/68  ?Pulse: 64  ?Resp: 16  ?Temp: (!) 97.1 ?  F (36.2 ?C)  ?SpO2: 98%  ? ? ? ?Body mass index is 31.05 kg/m?. Danley Danker Weights  ? 12/12/21 0951  ?Weight: 159 lb (72.1 kg)  ? ? ? ?Objective:  ? Physical Exam ?Vitals and nursing note reviewed.  ?Constitutional:   ?   General: She is not in acute distress. ?Neck:  ?   Vascular: No JVD.  ?Cardiovascular:  ?   Rate and Rhythm: Normal rate and regular rhythm.  ?   Heart sounds: Normal heart sounds. No murmur heard. ?Pulmonary:  ?   Effort: Pulmonary effort is normal.  ?   Breath sounds: Normal breath sounds. No wheezing or rales.  ?Musculoskeletal:  ?   Right lower leg: No edema.  ?   Left lower leg: No edema.  ? ? ? ?   ? ?Visit diagnoses: ?  ICD-10-CM   ?1. Nonrheumatic aortic valve stenosis  I35.0 EKG 12-Lead  ?  ?2. Epigastric pain  R10.13 PCV AORTA DUPLEX  ?  ?3. Atypical chest pain  R07.89 PCV CARDIAC STRESS TEST  ?  CT CARDIAC SCORING ([facility] LOCATIONS ONLY)  ?  ?4. Family history of abdominal aortic aneurysm  Z82.49 PCV AORTA DUPLEX  ?  ?5. Essential hypertension  I10 CT CARDIAC SCORING ([facility] LOCATIONS ONLY)  ?  ?  ? ?Orders Placed This Encounter  ?Procedures  ? CT CARDIAC SCORING ([facility] LOCATIONS ONLY)  ? PCV CARDIAC STRESS TEST  ? EKG 12-Lead  ? PCV AORTA DUPLEX  ?  ? ?Assessment & Recommendations:  ? ?66 y.o. Caucasian female with hypertension, family h/o AAA w/rupture, mild AS, referred for cardiac murmur. ? ?Mild AS: ?Clinically asymptomatic. Repeat echocardiogram in 2 years ? ?Epigastric/lower chest pain: ?Given family h/o AAA rupture, check aorta duplex. ?If normal, will the n obtain exercise treadmill stress testing and CT cardiac scoring. ?Other differential is gastritis. Consider holding Aspirin for a week to see  if symptoms improve. This may need f/u w/PCP ? ?Further recommendations after above testing ? ?Thank you for referring the patient to [place]. Please feel free to contact with any questions. ? ? ?Nigel Mormon, MD

## 2021-12-13 ENCOUNTER — Ambulatory Visit: Payer: Medicare Other

## 2021-12-13 DIAGNOSIS — R1013 Epigastric pain: Secondary | ICD-10-CM

## 2021-12-13 DIAGNOSIS — Z8249 Family history of ischemic heart disease and other diseases of the circulatory system: Secondary | ICD-10-CM

## 2021-12-13 DIAGNOSIS — I77811 Abdominal aortic ectasia: Secondary | ICD-10-CM

## 2022-01-12 ENCOUNTER — Ambulatory Visit
Admission: RE | Admit: 2022-01-12 | Discharge: 2022-01-12 | Disposition: A | Discharge status: Home / Self Care | Payer: Medicare Other | Source: Ambulatory Visit | Attending: *Deleted | Admitting: *Deleted

## 2022-01-12 DIAGNOSIS — Z122 Encounter for screening for malignant neoplasm of respiratory organs: Secondary | ICD-10-CM

## 2022-01-20 ENCOUNTER — Ambulatory Visit: Payer: Medicare Other

## 2022-01-20 DIAGNOSIS — R0789 Other chest pain: Secondary | ICD-10-CM

## 2022-01-23 ENCOUNTER — Ambulatory Visit
Admission: RE | Admit: 2022-01-23 | Discharge: 2022-01-23 | Disposition: A | Discharge status: Home / Self Care | Payer: No Typology Code available for payment source | Source: Ambulatory Visit | Attending: Cardiology | Admitting: Cardiology

## 2022-01-23 DIAGNOSIS — I10 Essential (primary) hypertension: Secondary | ICD-10-CM

## 2022-01-23 DIAGNOSIS — R0789 Other chest pain: Secondary | ICD-10-CM

## 2022-01-24 ENCOUNTER — Telehealth: Payer: Self-pay | Admitting: Cardiology

## 2022-01-24 ENCOUNTER — Other Ambulatory Visit: Payer: Self-pay | Admitting: Cardiology

## 2022-01-24 DIAGNOSIS — R0789 Other chest pain: Secondary | ICD-10-CM

## 2022-01-24 DIAGNOSIS — R9439 Abnormal result of other cardiovascular function study: Secondary | ICD-10-CM | POA: Insufficient documentation

## 2022-01-24 DIAGNOSIS — R931 Abnormal findings on diagnostic imaging of heart and coronary circulation: Secondary | ICD-10-CM

## 2022-01-24 DIAGNOSIS — R9431 Abnormal electrocardiogram [ECG] [EKG]: Secondary | ICD-10-CM

## 2022-01-24 MED ORDER — ROSUVASTATIN CALCIUM 20 MG PO TABS: 20.0000 mg | ORAL_TABLET | Freq: Every day | ORAL | 3 refills | Status: DC

## 2022-01-24 MED ORDER — METOPROLOL TARTRATE 25 MG PO TABS: 25.0000 mg | ORAL_TABLET | Freq: Two times a day (BID) | ORAL | 3 refills | Status: DC

## 2022-01-24 NOTE — Telephone Encounter (Signed)
CT cardiac scoring 01/23/2022: LM: 0 LAD: 52.3 LCx: 0 RCA: 293 Total score: 345, 69th percentile  Normal aorta  Small solid pulmonary nodule of the right middle lobe is unchanged in size when compared with 2019 prior and likely a benign intrapulmonary lymph node, no further follow-up imaging recommended.  Exercise treadmill stress test 01/20/2022: Exercise treadmill stress test performed using Bruce protocol.  Patient reached 7 METS, and 96% of age predicted maximum heart rate.  Exercise capacity was fair.  No chest pain reported.  Normal heart rate response. Resting hypertension 140/90 mmHg, with peak BP 220/60 mmHg. Stress EKG showed sinus tachycardia, 1-1.5 mm upsloping ST depression in leads II, III, aVF, V4-V6, that only partially recovered at 2 min into recovery. These changes are positive for ischemia. Abnormal exercise treadmill stress test.  Reviewed the above findings with the patient over the phone. After holding aspirin for a week, patient has not had any further lower chest/epigastric symptoms. However, given above findings, she does warrant further work-up to rule out obstructive CAD. Recommend adding metoprolol tartrate  25 mg twice daily, Crestor 20 mg daily. Obtain BMP, lipid panel and plan for coronary CT angiogram. I asked the patient to take 2 pills of metoprolol tartrate 25 mg on the morning of CT scan, and take remaining pills with her, in case she will need additional pills to control heart rate better.  Patient would like to do this after her upcoming trip.  This is reasonable.  We will schedule follow-up after CT scan.    Elder Negus, MD Pager: [phone] Office: [phone]

## 2022-02-23 LAB — LIPID PANEL
Chol/HDL Ratio: 2.8 ratio (ref 0.0–4.4)
Cholesterol, Total: 94 mg/dL — ABNORMAL LOW (ref 100–199)
HDL: 33 mg/dL — ABNORMAL LOW (ref >39)
LDL Chol Calc (NIH): 41 mg/dL (ref 0–99)
Triglycerides: 104 mg/dL (ref 0–149)
VLDL Cholesterol Cal: 20 mg/dL (ref 5–40)

## 2022-02-23 LAB — CBC
Hematocrit: 39.7 % (ref 34.0–46.6)
Hemoglobin: 13.5 g/dL (ref 11.1–15.9)
MCH: 31.5 pg (ref 26.6–33.0)
MCHC: 34 g/dL (ref 31.5–35.7)
MCV: 93 fL (ref 79–97)
Platelets: 254 10*3/uL (ref 150–450)
RBC: 4.29 x10E6/uL (ref 3.77–5.28)
RDW: 12.7 % (ref 11.7–15.4)
WBC: 9.7 10*3/uL (ref 3.4–10.8)

## 2022-02-23 LAB — BASIC METABOLIC PANEL
BUN/Creatinine Ratio: 19 (ref 12–28)
BUN: 15 mg/dL (ref 8–27)
CO2: 24 mmol/L (ref 20–29)
Calcium: 9.8 mg/dL (ref 8.7–10.3)
Chloride: 100 mmol/L (ref 96–106)
Creatinine, Ser: 0.77 mg/dL (ref 0.57–1.00)
Glucose: 99 mg/dL (ref 70–99)
Potassium: 4 mmol/L (ref 3.5–5.2)
Sodium: 140 mmol/L (ref 134–144)
eGFR: 84 mL/min/{1.73_m2} (ref >59)

## 2022-02-28 ENCOUNTER — Telehealth (HOSPITAL_COMMUNITY): Payer: Self-pay | Admitting: Emergency Medicine

## 2022-02-28 NOTE — Telephone Encounter (Signed)
Reaching out to patient to offer assistance regarding upcoming cardiac imaging study; pt verbalizes understanding of appt date/time, parking situation and where to check in, pre-test NPO status and medications ordered, and verified current allergies; name and call back number provided for further questions should they arise Rockwell Alexandria RN Navigator Cardiac Imaging [facility] [phone] office [phone] cell  Denies iv issues Arrival 1230 w/c entrance Holding benicar, taking AM 25mg  metoprolol 2 hr prior to scan Aware of nitro

## 2022-03-01 ENCOUNTER — Ambulatory Visit (HOSPITAL_COMMUNITY)
Admission: RE | Admit: 2022-03-01 | Discharge: 2022-03-01 | Disposition: A | Discharge status: Home / Self Care | Payer: Medicare Other | Source: Ambulatory Visit | Attending: *Deleted | Admitting: *Deleted

## 2022-03-01 DIAGNOSIS — R9439 Abnormal result of other cardiovascular function study: Secondary | ICD-10-CM | POA: Insufficient documentation

## 2022-03-01 DIAGNOSIS — R0789 Other chest pain: Secondary | ICD-10-CM | POA: Insufficient documentation

## 2022-03-01 DIAGNOSIS — R9431 Abnormal electrocardiogram [ECG] [EKG]: Secondary | ICD-10-CM | POA: Insufficient documentation

## 2022-03-01 MED ORDER — NITROGLYCERIN 0.4 MG SL SUBL
SUBLINGUAL_TABLET | SUBLINGUAL | Status: AC
  Filled 2022-03-01: qty 2

## 2022-03-01 MED ORDER — IOHEXOL 350 MG/ML SOLN
95.0000 mL | Freq: Once | INTRAVENOUS | Status: AC | PRN
  Administered 2022-03-01: 95 mL via INTRAVENOUS

## 2022-03-01 MED ORDER — NITROGLYCERIN 0.4 MG SL SUBL
0.8000 mg | SUBLINGUAL_TABLET | Freq: Once | SUBLINGUAL | Status: AC
  Administered 2022-03-01: 0.8 mg via SUBLINGUAL

## 2022-03-07 ENCOUNTER — Other Ambulatory Visit: Payer: Self-pay | Admitting: Cardiology

## 2022-03-07 ENCOUNTER — Ambulatory Visit (HOSPITAL_COMMUNITY)
Admission: RE | Admit: 2022-03-07 | Discharge: 2022-03-07 | Disposition: A | Discharge status: Home / Self Care | Payer: Medicare Other | Source: Ambulatory Visit | Attending: Cardiology | Admitting: Cardiology

## 2022-03-07 DIAGNOSIS — R931 Abnormal findings on diagnostic imaging of heart and coronary circulation: Secondary | ICD-10-CM | POA: Insufficient documentation

## 2022-04-24 ENCOUNTER — Other Ambulatory Visit: Payer: Self-pay

## 2022-04-24 DIAGNOSIS — R931 Abnormal findings on diagnostic imaging of heart and coronary circulation: Secondary | ICD-10-CM

## 2022-04-24 MED ORDER — ROSUVASTATIN CALCIUM 20 MG PO TABS: 20.0000 mg | ORAL_TABLET | Freq: Every day | ORAL | 2 refills | Status: AC

## 2022-05-16 ENCOUNTER — Encounter: Payer: Self-pay | Admitting: Allergy and Immunology

## 2022-05-16 ENCOUNTER — Ambulatory Visit (INDEPENDENT_AMBULATORY_CARE_PROVIDER_SITE_OTHER): Payer: Medicare Other | Admitting: Allergy and Immunology

## 2022-05-16 VITALS — BP 132/86 | HR 59 | Temp 97.8°F | Resp 16 | Ht 61.0 in | Wt 161.1 lb

## 2022-05-16 DIAGNOSIS — J3089 Other allergic rhinitis: Secondary | ICD-10-CM | POA: Diagnosis not present

## 2022-05-16 DIAGNOSIS — R931 Abnormal findings on diagnostic imaging of heart and coronary circulation: Secondary | ICD-10-CM | POA: Diagnosis not present

## 2022-05-16 DIAGNOSIS — J301 Allergic rhinitis due to pollen: Secondary | ICD-10-CM

## 2022-05-16 DIAGNOSIS — D849 Immunodeficiency, unspecified: Secondary | ICD-10-CM

## 2022-05-16 DIAGNOSIS — J4530 Mild persistent asthma, uncomplicated: Secondary | ICD-10-CM

## 2022-05-16 DIAGNOSIS — K219 Gastro-esophageal reflux disease without esophagitis: Secondary | ICD-10-CM | POA: Diagnosis not present

## 2022-05-16 MED ORDER — ARNUITY ELLIPTA 200 MCG/ACT IN AEPB: INHALATION_SPRAY | RESPIRATORY_TRACT | 11 refills | Status: DC

## 2022-05-16 MED ORDER — FLUTICASONE PROPIONATE 50 MCG/ACT NA SUSP: NASAL | 5 refills | Status: DC

## 2022-05-16 MED ORDER — ESOMEPRAZOLE MAGNESIUM 40 MG PO CPDR: DELAYED_RELEASE_CAPSULE | ORAL | 2 refills | Status: DC

## 2022-05-16 MED ORDER — ALBUTEROL SULFATE HFA 108 (90 BASE) MCG/ACT IN AERS: INHALATION_SPRAY | RESPIRATORY_TRACT | 2 refills | Status: DC

## 2022-05-16 NOTE — Progress Notes (Unsigned)
[place] - [place] - [place] - [place] - [place]   Follow-up Note  Referring Provider: Marva Panda, NP Primary Provider: Marva Panda, NP Date of Office Visit: 05/16/2022  Subjective:   Taylor Stanley (DOB: 06/15/1956) is a 66 y.o. female who [facility] in re-evaluation of the following:  HPI: Taylor Stanley returns to this clinic in reevaluation of asthma, allergic rhinitis, and LPR.  Her last visit to this clinic with me was 17 May 2021.   She has really done well with her asthma and has not required a systemic steroid to treat an exacerbation and rarely uses a short acting bronchodilator while she continues on Arnuity on a consistent basis.  She had a recent chest CT scan which identified some centrilobular emphysema but no other significant abnormalities.  She has had very little problems with her upper airways while occasionally using some nasal steroids.  She has not required an antibiotic treating episode of sinusitis.  She contracted COVID once again this September manifested as fever and chills and headache but a relatively mild presentation with her most recent COVID infection.  She continues to treat her reflux with proton pump inhibitor but does not need to use any H2 receptor blocker at this point in time.  She continues on immunosuppression with an anti-TNF antibody for rheumatoid arthritis.  Allergies as of 05/16/2022       Reactions   Infliximab Other (See Comments)   Other reaction(s): malaise/headache after infusion   Penicillins Hives, Swelling, Rash        Medication List    albuterol 108 (90 Base) MCG/ACT inhaler Commonly known as: VENTOLIN HFA TAKE 2 PUFFS BY MOUTH EVERY 4 HOURS AS NEEDED   Arnuity Ellipta 200 MCG/ACT Aepb Generic drug: Fluticasone Furoate INHALE 1 PUFF BY MOUTH EVERY DAY   cholecalciferol 1000 units tablet Commonly known as: VITAMIN D Take 1,000 Units by mouth  daily.   desloratadine 5 MG tablet Commonly known as: CLARINEX TAKE ONE TABLET BY MOUTH DAILY AS NEEDED   esomeprazole 40 MG capsule Commonly known as: NEXIUM TAKE 1 CAPSULE BY MOUTH EVERY DAY IN THE MORNING   fluocinonide cream 0.05 % Commonly known as: LIDEX fluocinonide 0.05 % topical cream   fluticasone 50 MCG/ACT nasal spray Commonly known as: FLONASE SPRAY 1-2 SPRAYS INTO EACH NOSTRIL 1-7 times per week.   levocetirizine 5 MG tablet Commonly known as: XYZAL Take 1 tablet (5 mg total) by mouth every evening. What changed:  when to take this reasons to take this   metoprolol tartrate 25 MG tablet Commonly known as: LOPRESSOR Take 1 tablet (25 mg total) by mouth 2 (two) times daily.   olmesartan-hydrochlorothiazide 40-25 MG tablet Commonly known as: BENICAR HCT Take 1 tablet by mouth daily.   rosuvastatin 20 MG tablet Commonly known as: CRESTOR Take 1 tablet (20 mg total) by mouth daily.   sertraline 100 MG tablet Commonly known as: ZOLOFT 100 mg daily. Take one half daily   SIMPONI ARIA IV Inject into the vein.   triamcinolone cream 0.1 % Commonly known as: KENALOG APPLY TO AFFECTED AREA TWICE A DAY        Past Medical History:  Diagnosis Date   Anxiety    Asthma    triggered by allergies; does not use inhaler daily   Carpal tunnel syndrome of right wrist 11/2011   Dental crowns present    Depression    GERD (gastroesophageal reflux disease)  Hx of colonic polyps    Hypertension    under control; has been on med. x 5 yrs.   Osteopenia    PONV (postoperative nausea and vomiting)    Seasonal allergies    Seizure (HCC) as an infant   x 1 - unknown cause   Snores    denies apnea   TMJ disease    Wears glasses     Past Surgical History:  Procedure Laterality Date   CARPAL TUNNEL RELEASE  05/08/2007   left   CARPAL TUNNEL RELEASE  12/12/2011   Procedure: CARPAL TUNNEL RELEASE;  Surgeon: Cammie Sickle., MD;  Location: [place];  Service: Orthopedics;  Laterality: Right;   catarac removal right eye Right 05/2019   CERVICAL DISC SURGERY  2011   CESAREAN SECTION  10-94   BTL   CHOLECYSTECTOMY  07/07/2005   lap. chole.   ESOPHAGEAL DILATION  05/15/2003   LUMBAR DISC SURGERY  early 1990's   SPINE SURGERY     TONSILLECTOMY AND ADENOIDECTOMY  as a child   TRIGGER FINGER RELEASE Right 02/12/2014   Procedure: RELEASE TRIGGER FINGER/A-1 PULLEY RIGHT INDEX AND RIGHT LONG FINGERS;  Surgeon: Cammie Sickle, MD;  Location: [place];  Service: Orthopedics;  Laterality: Right;   TUBAL LIGATION  1994   WISDOM TOOTH EXTRACTION      Review of systems negative except as noted in HPI / PMHx or noted below:  Review of Systems  Constitutional: Negative.   HENT: Negative.    Eyes: Negative.   Respiratory: Negative.    Cardiovascular: Negative.   Gastrointestinal: Negative.   Genitourinary: Negative.   Musculoskeletal: Negative.   Skin: Negative.   Neurological: Negative.   Endo/Heme/Allergies: Negative.   Psychiatric/Behavioral: Negative.       Objective:   Vitals:   05/16/22 1149  BP: 132/86  Pulse: (!) 59  Resp: 16  Temp: 97.8 F (36.6 C)  SpO2: 97%   Height: 5\' 1"  (154.9 cm)  Weight: 161 lb 1.6 oz (73.1 kg)   Physical Exam Constitutional:      Appearance: She is not diaphoretic.  HENT:     Head: Normocephalic.     Right Ear: Tympanic membrane, ear canal and external ear normal.     Left Ear: Tympanic membrane, ear canal and external ear normal.     Nose: Nose normal. No mucosal edema or rhinorrhea.     Mouth/Throat:     Pharynx: Uvula midline. No oropharyngeal exudate.  Eyes:     Conjunctiva/sclera: Conjunctivae normal.  Neck:     Thyroid: No thyromegaly.     Trachea: Trachea normal. No tracheal tenderness or tracheal deviation.  Cardiovascular:     Rate and Rhythm: Normal rate and regular rhythm.     Heart sounds: S1 normal and S2 normal. Murmur (Systolic) heard.   Pulmonary:     Effort: No respiratory distress.     Breath sounds: Normal breath sounds. No stridor. No wheezing or rales.  Lymphadenopathy:     Head:     Right side of head: No tonsillar adenopathy.     Left side of head: No tonsillar adenopathy.     Cervical: No cervical adenopathy.  Skin:    Findings: No erythema or rash.     Nails: There is no clubbing.  Neurological:     Mental Status: She is alert.     Diagnostics:    Spirometry was performed and demonstrated an FEV1 of  1.42 at 69 % of predicted.   Assessment and Plan:   1. Asthma, well controlled, mild persistent   2. Perennial allergic rhinitis   3. Seasonal allergic rhinitis due to pollen   4. LPRD (laryngopharyngeal reflux disease)   5. Immunosuppression (HCC)     1.  Continue to perform allergen avoidance measures - dust mite / pollens  2.  Continue to treat and prevent infalammation:   A.  Arnuity 200-1 inhalation 1 time per day  B.  Flonase-1-2 sprays each nostril 1-7 times per week  3.  Continue to treat and prevent reflux:   A.  Consolidate caffeine consumption  B.  Nexium 40 mg tablet in a.m.  4. If needed:   A.  Albuterol HFA-2 inhalations every 4-6 hours  B.  antihistamine- Clarinex / Claritin / Allegra / Xyzal / Zyrtec  C.  OTC nasal saline  D.  OTC Systane eyedrops  E.  famotidine 40 mg tablet in p.m.    5. Return to clinic in 12 months or earlier if problem  6.  Obtain fall flu vaccine and RSV vaccine  Taylor Stanley has really done well since her last visit while using a collection of anti-inflammatory agents for airway and therapy directed against reflux.  She has a very good understanding of her disease state and how to use her medications and appropriate dosing of her medications depending on disease activity.  Assuming she does well I will see her back in this clinic in 12 months or earlier if there is a problem.  Laurette Schimke, MD Allergy / Immunology [facility]

## 2022-05-16 NOTE — Patient Instructions (Signed)
  1.  Continue to perform allergen avoidance measures - dust mite / pollens  2.  Continue to treat and prevent infalammation:   A.  Arnuity 200-1 inhalation 1 time per day  B.  Flonase-1-2 sprays each nostril 1-7 times per week  3.  Continue to treat and prevent reflux:   A.  Consolidate caffeine consumption  B.  Nexium 40 mg tablet in a.m.  4. If needed:   A.  Albuterol HFA-2 inhalations every 4-6 hours  B.  antihistamine- Clarinex / Claritin / Allegra / Xyzal / Zyrtec  C.  OTC nasal saline  D.  OTC Systane eyedrops  E.  famotidine 40 mg tablet in p.m.    5. Return to clinic in 12 months or earlier if problem  6.  Obtain fall flu vaccine and RSV vaccine

## 2022-05-17 ENCOUNTER — Encounter: Payer: Self-pay | Admitting: Allergy and Immunology

## 2022-05-23 ENCOUNTER — Telehealth: Payer: Self-pay

## 2022-05-23 MED ORDER — ARNUITY ELLIPTA 200 MCG/ACT IN AEPB: INHALATION_SPRAY | RESPIRATORY_TRACT | 11 refills | Status: DC

## 2022-05-23 NOTE — Telephone Encounter (Signed)
Per covermymeds .Marland Kitchen...  Brand Arnuity Ellipta does not require a PA. Resending prescription to pharmacy as Brand.   Medication                                                                                                PA Requirement  Fluticasone Furoate-Vilanterol 200-25MCG/ACT aerosol powder         Required AirDuo RespiClick                                                                                Not Required Anoro Ellipta                                                                                            Not Required Arnuity Ellipta                                                                                            Not Required Adair Patter                                                                                            Not Required Judithann Sauger Aerosphere                                                                                Not Required Budesonide-Formoterol Fumarate Dihydrate  Not Required [facility]                                                                                                        Not Required Fluticasone/Salmeterol 250/50 Diskus ([employer])                                 Not Required Trelegy Ellipta                                                                                            Not Required  Called patient - DPR needs to be updated - LMOVM advising of the above.

## 2022-05-29 ENCOUNTER — Other Ambulatory Visit: Payer: Self-pay | Admitting: Allergy and Immunology

## 2022-05-29 ENCOUNTER — Other Ambulatory Visit: Payer: Self-pay | Admitting: Cardiology

## 2022-05-29 DIAGNOSIS — R931 Abnormal findings on diagnostic imaging of heart and coronary circulation: Secondary | ICD-10-CM

## 2022-05-29 DIAGNOSIS — R9439 Abnormal result of other cardiovascular function study: Secondary | ICD-10-CM

## 2022-05-29 DIAGNOSIS — R0789 Other chest pain: Secondary | ICD-10-CM

## 2022-07-11 ENCOUNTER — Other Ambulatory Visit: Payer: Self-pay | Admitting: Cardiology

## 2022-07-11 ENCOUNTER — Telehealth: Payer: Self-pay

## 2022-07-11 MED ORDER — METOPROLOL SUCCINATE ER 25 MG PO TB24: 25.0000 mg | ORAL_TABLET | Freq: Every day | ORAL | 3 refills | Status: DC

## 2022-07-11 NOTE — Telephone Encounter (Signed)
Patient called and wants to know, does she need to continue taking Metformin? Please advise.   [phone]

## 2022-07-11 NOTE — Telephone Encounter (Signed)
I spoke with the patient. Its metoprolol, not metformin. I sent refill.  Nonobstructive CAD to be treated medically.  Please arrange f/u in march.  Thanks MJP

## 2022-10-18 ENCOUNTER — Ambulatory Visit: Payer: Medicare Other | Admitting: Cardiology

## 2022-10-18 ENCOUNTER — Encounter: Payer: Self-pay | Admitting: Cardiology

## 2022-10-18 VITALS — BP 137/66 | HR 61 | Resp 16 | Ht 61.0 in | Wt 163.0 lb

## 2022-10-18 DIAGNOSIS — I2510 Atherosclerotic heart disease of native coronary artery without angina pectoris: Secondary | ICD-10-CM

## 2022-10-18 DIAGNOSIS — I350 Nonrheumatic aortic (valve) stenosis: Secondary | ICD-10-CM

## 2022-10-18 DIAGNOSIS — I351 Nonrheumatic aortic (valve) insufficiency: Secondary | ICD-10-CM | POA: Insufficient documentation

## 2022-10-18 DIAGNOSIS — R5383 Other fatigue: Secondary | ICD-10-CM | POA: Insufficient documentation

## 2022-10-18 MED ORDER — AMLODIPINE BESYLATE 5 MG PO TABS: 5.0000 mg | ORAL_TABLET | Freq: Every day | ORAL | 3 refills | Status: DC

## 2022-10-18 NOTE — Progress Notes (Signed)
Patient referred by Everardo Beals, NP for murmur  Subjective:   Taylor Stanley, female    DOB: [birth year], 67 y.o.   MRN: [id number]   Chief Complaint  Patient presents with   Coronary Artery Disease   Follow-up     HPI  67 y.o. Caucasian female with hypertension, elevated CAC, nonobstructive CAD, mild AS, mild AI, family h/o AAA w/rupture  Recently, patient has noticed increasing fatigue and tiredness without any overt exertional chest pain, unchanged mild exertional dyspnea.  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  She has noticed that her fatigue symptoms are better on days that she misses her medications.   Current Outpatient Medications:    albuterol (VENTOLIN HFA) 108 (90 Base) MCG/ACT inhaler, TAKE 2 PUFFS BY MOUTH EVERY 4 HOURS AS NEEDED, Disp: 18 g, Rfl: 2   ARNUITY ELLIPTA 200 MCG/ACT AEPB, Inhale 1 (ONE) puff by mouth per day., Disp: 30 each, Rfl: 11   cholecalciferol (VITAMIN D) 1000 UNITS tablet, Take 1,000 Units by mouth daily., Disp: , Rfl:    desloratadine (CLARINEX) 5 MG tablet, TAKE ONE TABLET BY MOUTH DAILY AS NEEDED, Disp: 90 tablet, Rfl: 0   esomeprazole (NEXIUM) 40 MG capsule, TAKE 1 CAPSULE BY MOUTH EVERY DAY IN THE MORNING, Disp: 90 capsule, Rfl: 2   fluocinonide cream (LIDEX) 0.05 %, fluocinonide 0.05 % topical cream, Disp: , Rfl:    fluticasone (FLONASE) 50 MCG/ACT nasal spray, SPRAY 1-2 SPRAYS INTO EACH NOSTRIL 1-7 times per week., Disp: 16 mL, Rfl: 5   Fluticasone Furoate (ARNUITY ELLIPTA) 200 MCG/ACT AEPB, INHALE 1 PUFF BY MOUTH EVERY DAY, Disp: 30 each, Rfl: 11   Golimumab (SIMPONI ARIA IV), Inject into the vein., Disp: , Rfl:    levocetirizine (XYZAL) 5 MG tablet, Take 1 tablet (5 mg total) by mouth every evening. (Patient taking differently: Take 5 mg by mouth as needed.), Disp: 30 tablet, Rfl: 11   metoprolol succinate (TOPROL-XL) 25 MG 24 hr tablet, Take 1 tablet (25 mg total) by mouth daily., Disp: 90 tablet, Rfl: 3    olmesartan-hydrochlorothiazide (BENICAR HCT) 40-25 MG tablet, Take 1 tablet by mouth daily., Disp: , Rfl:    rosuvastatin (CRESTOR) 20 MG tablet, Take 1 tablet (20 mg total) by mouth daily., Disp: 90 tablet, Rfl: 2   sertraline (ZOLOFT) 100 MG tablet, 100 mg daily. Take one half daily, Disp: , Rfl:    triamcinolone cream (KENALOG) 0.1 %, APPLY TO AFFECTED AREA TWICE A DAY, Disp: , Rfl:    Cardiovascular and other pertinent studies:  Reviewed external labs and tests, independently interpreted  EKG 10/18/2022: Sinus rhythm 59 bpm Nonspecific T wave abnormality  Coronary CTA 02/2022: Total calcium: 322, 90% (Calcium score in parenthesis below). LM: Patent.(0) LAD: Mild stenosis (25-49%) due to noncalcified plaque at the mid segment. (40.6) LCx:  Patent. (8.55) RCA: Mild to moderate stenosis due to diffuse mixed and calcified plaque within the proximal to mid segment (accuracy may be reduced due to blooming artifact). (273)  CT FFR: LM: 0.99 LAD: Proximal FFR = 0.98, mid FFR = 0.95, distal FFR = 0.90 Ramus: Proximal FFR = 0.98, distal FFR = not modeled due to small caliber vessel. LCX: Proximal FFR = 0.98, distal FFR = 0.96 RCA: Proximal FFR = 1.0, mid FFR =0.93, distal FFR = 0.93   IMPRESSION: 1.  CT FFR analysis showed no significant stenosis.  Abdominal Aortic Duplex 12/13/2021: Mild ectasia noted in the proximal abdominal aorta measuring 2.9  x2.8x2.6 cm. No AAA noted. Right iliac artery velocity elevated suggests <50% stenosis vs tortuosity. Clinical correlation recommended.    EKG 12/12/2021: Sinus rhythm 60 bpm Normal EKG  Echocardiogram 06/28/2021:  1. Left ventricular ejection fraction, by estimation, is 60 to 65%. The  left ventricle has normal function. The left ventricle has no regional  wall motion abnormalities. There is mild left ventricular hypertrophy.  Left ventricular diastolic parameters  are consistent with Grade I diastolic dysfunction (impaired  relaxation).   2. Right ventricular systolic function is normal. The right ventricular  size is normal.   3. The mitral valve is grossly normal. Trivial mitral valve  regurgitation.   4. The aortic valve is tricuspid. Aortic valve regurgitation is mild.  Mild aortic valve stenosis. Aortic valve area, by VTI measures 1.31 cm.  Aortic valve mean gradient measures 13.0 mmHg. Aortic valve Vmax measures  2.45 m/s.   5. The inferior vena cava is normal in size with greater than 50%  respiratory variability, suggesting right atrial pressure of 3 mmHg.   Comparison(s): Changes from prior study are noted. 02/25/2019: LVEF 60-65%,  no AS, mild AI.    Recent labs: 02/22/2022: Glucose 99, BUN/Cr 15/0.77. EGFR 84. Na/K 140/4.0.  H/H 13/39. MCV 93. Platelets 254  08/24/2021: Glucose 998, BUN/Cr 14/0.82. EGFR 71. HbA1C 5.3%  12/2020: Chol 179, TG 136, HDL 37, LDL 115 TSH 1.8 normal   Review of Systems  Constitutional: Positive for malaise/fatigue.  Cardiovascular:  Negative for chest pain, dyspnea on exertion, leg swelling, palpitations and syncope.         Vitals:   10/18/22 1102  BP: 137/66  Pulse: 61  Resp: 16  SpO2: 96%     Body mass index is 30.8 kg/m. Filed Weights   10/18/22 1102  Weight: 163 lb (73.9 kg)     Objective:   Physical Exam Vitals and nursing note reviewed.  Constitutional:      General: She is not in acute distress. Neck:     Vascular: No JVD.  Cardiovascular:     Rate and Rhythm: Normal rate and regular rhythm.     Heart sounds: Normal heart sounds. No murmur heard. Pulmonary:     Effort: Pulmonary effort is normal.     Breath sounds: Normal breath sounds. No wheezing or rales.  Musculoskeletal:     Right lower leg: No edema.     Left lower leg: No edema.          Visit diagnoses:   ICD-10-CM   1. Fatigue, unspecified type  R53.83     2. Coronary artery disease involving native coronary artery of native heart without angina pectoris   I25.10 EKG 12-Lead    Lipid panel    Lipoprotein A (LPA)    3. Nonrheumatic aortic valve stenosis  I35.0 PCV ECHOCARDIOGRAM COMPLETE    4. Nonrheumatic aortic valve insufficiency  I35.1 PCV ECHOCARDIOGRAM COMPLETE       Orders Placed This Encounter  Procedures   Lipid panel   Lipoprotein A (LPA)   EKG 12-Lead   PCV ECHOCARDIOGRAM COMPLETE     Assessment & Recommendations:    67 y.o. Caucasian female with hypertension, elevated CAC, nonobstructive CAD, mild AS, mild AI, family h/o AAA w/rupture  CAD: Nonobstructive, in spite of CAC 312 (CCTA 02/2022). Continue Crestor 20 mg daily, check lipid panel.  Fatigue: I reckon this could be related to metoprolol.  I will change metoprolol 25 mg daily to amlodipine 5 mg daily as an  antihypertensive and antianginal agent.  Mild AS, mild AI: Clinically asymptomatic.  Murmur appears slightly louder than prior.  Check echocardiogram.    Epigastric/lower chest pain: Given family h/o AAA rupture, check aorta duplex. If normal, will the n obtain exercise treadmill stress testing and CT cardiac scoring. Other differential is gastritis. Consider holding Aspirin for a week to see if symptoms improve. This may need f/u w/PCP  F/u in 4 weeks   Nigel Mormon, MD Pager: [phone] Office: [phone]

## 2022-10-21 LAB — LIPID PANEL
Chol/HDL Ratio: 3.1 ratio (ref 0.0–4.4)
Cholesterol, Total: 110 mg/dL (ref 100–199)
HDL: 36 mg/dL — ABNORMAL LOW (ref >39)
LDL Chol Calc (NIH): 57 mg/dL (ref 0–99)
Triglycerides: 86 mg/dL (ref 0–149)
VLDL Cholesterol Cal: 17 mg/dL (ref 5–40)

## 2022-10-21 LAB — LIPOPROTEIN A (LPA): Lipoprotein (a): 26.1 nmol/L (ref <75.0)

## 2022-11-16 ENCOUNTER — Other Ambulatory Visit: Payer: Medicare Other

## 2022-11-27 ENCOUNTER — Ambulatory Visit: Payer: Medicare Other

## 2022-11-27 DIAGNOSIS — I351 Nonrheumatic aortic (valve) insufficiency: Secondary | ICD-10-CM

## 2022-11-27 DIAGNOSIS — I350 Nonrheumatic aortic (valve) stenosis: Secondary | ICD-10-CM

## 2022-12-20 ENCOUNTER — Other Ambulatory Visit: Payer: Self-pay | Admitting: Allergy and Immunology

## 2022-12-20 ENCOUNTER — Ambulatory Visit: Payer: Medicare Other | Admitting: Cardiology

## 2022-12-20 ENCOUNTER — Encounter: Payer: Self-pay | Admitting: Cardiology

## 2022-12-20 VITALS — BP 123/74 | HR 67 | Resp 16 | Ht 61.0 in | Wt 161.0 lb

## 2022-12-20 DIAGNOSIS — I351 Nonrheumatic aortic (valve) insufficiency: Secondary | ICD-10-CM

## 2022-12-20 DIAGNOSIS — I2510 Atherosclerotic heart disease of native coronary artery without angina pectoris: Secondary | ICD-10-CM

## 2022-12-20 NOTE — Progress Notes (Signed)
Patient referred by Marva Panda, NP for murmur  Subjective:   Taylor Stanley, female    DOB: Apr 05, 1956, 67 y.o.   MRN: [id number]   Chief Complaint  Patient presents with   Nonrheumatic aortic valve stenosis   Hyperlipidemia   Follow-up    4-6 week    HPI  67 y.o. Caucasian female with hypertension, elevated CAC, nonobstructive CAD, mild AS, mild AI, family h/o AAA w/rupture  Patient is doing well form cardiac standpoint, denies chest pain, shortness of breath, palpitations, leg edema, orthopnea, PND, TIA/syncope. She has had arthritis symptoms. Reviewed recent test results with the patient, details below.     Recently, patient has noticed increasing fatigue and tiredness without any overt exertional chest pain, unchanged mild exertional dyspnea.  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  She has noticed that her fatigue symptoms are better on days that she misses her medications.   Current Outpatient Medications:    albuterol (VENTOLIN HFA) 108 (90 Base) MCG/ACT inhaler, TAKE 2 PUFFS BY MOUTH EVERY 4 HOURS AS NEEDED, Disp: 18 g, Rfl: 2   amLODipine (NORVASC) 5 MG tablet, Take 1 tablet (5 mg total) by mouth daily., Disp: 30 tablet, Rfl: 3   ARNUITY ELLIPTA 200 MCG/ACT AEPB, Inhale 1 (ONE) puff by mouth per day., Disp: 30 each, Rfl: 11   cholecalciferol (VITAMIN D) 1000 UNITS tablet, Take 1,000 Units by mouth daily., Disp: , Rfl:    desloratadine (CLARINEX) 5 MG tablet, TAKE ONE TABLET BY MOUTH DAILY AS NEEDED, Disp: 90 tablet, Rfl: 0   esomeprazole (NEXIUM) 40 MG capsule, TAKE 1 CAPSULE BY MOUTH EVERY DAY IN THE MORNING, Disp: 90 capsule, Rfl: 2   fluocinonide cream (LIDEX) 0.05 %, fluocinonide 0.05 % topical cream, Disp: , Rfl:    fluticasone (FLONASE) 50 MCG/ACT nasal spray, SPRAY 1-2 SPRAYS INTO EACH NOSTRIL 1-7 times per week., Disp: 16 mL, Rfl: 5   Fluticasone Furoate (ARNUITY ELLIPTA) 200 MCG/ACT AEPB, INHALE 1 PUFF BY MOUTH EVERY DAY, Disp:  30 each, Rfl: 11   Golimumab (SIMPONI ARIA IV), Inject into the vein., Disp: , Rfl:    levocetirizine (XYZAL) 5 MG tablet, Take 1 tablet (5 mg total) by mouth every evening. (Patient taking differently: Take 5 mg by mouth as needed.), Disp: 30 tablet, Rfl: 11   olmesartan-hydrochlorothiazide (BENICAR HCT) 40-25 MG tablet, Take 1 tablet by mouth daily., Disp: , Rfl:    rosuvastatin (CRESTOR) 20 MG tablet, Take 1 tablet (20 mg total) by mouth daily., Disp: 90 tablet, Rfl: 2   sertraline (ZOLOFT) 100 MG tablet, 100 mg daily. Take one half daily, Disp: , Rfl:    triamcinolone cream (KENALOG) 0.1 %, APPLY TO AFFECTED AREA TWICE A DAY, Disp: , Rfl:    Cardiovascular and other pertinent studies:  Reviewed external labs and tests, independently interpreted  EKG 10/18/2022: Sinus rhythm 59 bpm Nonspecific T wave abnormality  Echocardiogram 11/27/2022: Normal LV systolic function with visual EF 60-65%. Left ventricle cavity is normal in size. Mild concentric hypertrophy of the left ventricle. Normal global wall motion. Normal diastolic filling pattern, normal LAP. Calculated EF 64%. Trileaflet aortic valve. Mild to moderate aortic stenosis. Mild (Grade I) aortic regurgitation. Moderate aortic valve leaflet calcification. Vmax 2.59 m/sec, mean PG 12.3 mmHg, AVA 1.44 cm by continuity equation Structurally normal mitral valve.  Mild (Grade I) mitral regurgitation. Structurally normal tricuspid valve with trace regurgitation. No evidence of pulmonary hypertension. Echocardiogram in 05/2021 reported mild aortic valve  stenosis. Aortic valve area, by VTI measures 1.31 cm.  Aortic valve mean gradient measures 13.0 mmHg. Aortic valve Vmax measures 2.45 m/s.   Coronary CTA 02/2022: Total calcium: 322, 90% (Calcium score in parenthesis below). LM: Patent.(0) LAD: Mild stenosis (25-49%) due to noncalcified plaque at the mid segment. (40.6) LCx:  Patent. (8.55) RCA: Mild to moderate stenosis due to  diffuse mixed and calcified plaque within the proximal to mid segment (accuracy may be reduced due to blooming artifact). (273)  CT FFR: LM: 0.99 LAD: Proximal FFR = 0.98, mid FFR = 0.95, distal FFR = 0.90 Ramus: Proximal FFR = 0.98, distal FFR = not modeled due to small caliber vessel. LCX: Proximal FFR = 0.98, distal FFR = 0.96 RCA: Proximal FFR = 1.0, mid FFR =0.93, distal FFR = 0.93   IMPRESSION: 1.  CT FFR analysis showed no significant stenosis.  Abdominal Aortic Duplex 12/13/2021: Mild ectasia noted in the proximal abdominal aorta measuring 2.9 x2.8x2.6 cm. No AAA noted. Right iliac artery velocity elevated suggests <50% stenosis vs tortuosity. Clinical correlation recommended.   Echocardiogram 06/28/2021:  1. Left ventricular ejection fraction, by estimation, is 60 to 65%. The  left ventricle has normal function. The left ventricle has no regional  wall motion abnormalities. There is mild left ventricular hypertrophy.  Left ventricular diastolic parameters  are consistent with Grade I diastolic dysfunction (impaired relaxation).   2. Right ventricular systolic function is normal. The right ventricular  size is normal.   3. The mitral valve is grossly normal. Trivial mitral valve  regurgitation.   4. The aortic valve is tricuspid. Aortic valve regurgitation is mild.  Mild aortic valve stenosis. Aortic valve area, by VTI measures 1.31 cm.  Aortic valve mean gradient measures 13.0 mmHg. Aortic valve Vmax measures  2.45 m/s.   5. The inferior vena cava is normal in size with greater than 50%  respiratory variability, suggesting right atrial pressure of 3 mmHg.   Comparison(s): Changes from prior study are noted. 02/25/2019: LVEF 60-65%,  no AS, mild AI.   Abdominal Aortic Duplex 12/13/2021:  Mild ectasia noted in the proximal abdominal aorta measuring 2.9 x2.8x2.6  cm.  No AAA noted.  Right iliac artery velocity elevated suggests <50% stenosis vs tortuosity.   Clinical correlation recommended.   Recent labs: 10/20/2022: Chol 110, TG 86, HDL 36, LDL 57  02/22/2022: Glucose 99, BUN/Cr 15/0.77. EGFR 84. Na/K 140/4.0.  H/H 13/39. MCV 93. Platelets 254  08/24/2021: Glucose 998, BUN/Cr 14/0.82. EGFR 71. HbA1C 5.3%  12/2020: Chol 179, TG 136, HDL 37, LDL 115 TSH 1.8 normal   Review of Systems  Constitutional: Negative for malaise/fatigue.  Cardiovascular:  Negative for chest pain, dyspnea on exertion, leg swelling, palpitations and syncope.  Musculoskeletal:  Positive for joint pain.         Vitals:   12/20/22 1054  BP: 123/74  Pulse: 67  Resp: 16  SpO2: 96%      Body mass index is 30.42 kg/m. Filed Weights   12/20/22 1054  Weight: 161 lb (73 kg)      Objective:   Physical Exam Vitals and nursing note reviewed.  Constitutional:      General: She is not in acute distress. Neck:     Vascular: No JVD.  Cardiovascular:     Rate and Rhythm: Normal rate and regular rhythm.     Heart sounds: Normal heart sounds. No murmur heard. Pulmonary:     Effort: Pulmonary effort is normal.  Breath sounds: Normal breath sounds. No wheezing or rales.  Musculoskeletal:     Right lower leg: No edema.     Left lower leg: No edema.          Visit diagnoses:   ICD-10-CM   1. Coronary artery disease involving native coronary artery of native heart without angina pectoris  I25.10     2. Nonrheumatic aortic valve insufficiency  I35.1        No orders of the defined types were placed in this encounter.    Assessment & Recommendations:    66 y.o. Caucasian female with hypertension, elevated CAC, nonobstructive CAD, mild AS, mild AI, family h/o AAA w/rupture  CAD: Nonobstructive, in spite of CAC 312 (CCTA 02/2022). Chol 110, TG 86, HDL 36, LDL 57 (09/2022). Continue Crestor 20 mg daily. She takes Aspirin given remote h/o DVT. She does not daily daily Aspirin from CAD standpoint.   Fatigue: Resolved after stopping  metoprolol.  Mild to mod AS, mild AI: Clinically asymptomatic.  Repeat echocardiogram in 04/2024.  Screening for AAA: Mild ectasia but no AAA and proximal abdominal aorta.  Right iliac artery velocity elevated suggests <50% stenosis versus tortuosity. No claudication symptoms to suspect severe PAD.  F/u in 04/2024   Elder Negus, MD Pager: [phone] Office: [phone]

## 2023-05-22 ENCOUNTER — Other Ambulatory Visit: Payer: Self-pay

## 2023-05-22 ENCOUNTER — Ambulatory Visit (INDEPENDENT_AMBULATORY_CARE_PROVIDER_SITE_OTHER): Payer: Medicare Other | Admitting: Allergy and Immunology

## 2023-05-22 VITALS — BP 118/84 | HR 62 | Temp 98.2°F | Resp 14 | Ht 60.0 in | Wt 157.7 lb

## 2023-05-22 DIAGNOSIS — J301 Allergic rhinitis due to pollen: Secondary | ICD-10-CM

## 2023-05-22 DIAGNOSIS — K219 Gastro-esophageal reflux disease without esophagitis: Secondary | ICD-10-CM

## 2023-05-22 DIAGNOSIS — J4530 Mild persistent asthma, uncomplicated: Secondary | ICD-10-CM | POA: Diagnosis not present

## 2023-05-22 DIAGNOSIS — J3089 Other allergic rhinitis: Secondary | ICD-10-CM

## 2023-05-22 NOTE — Patient Instructions (Addendum)
  1.  Continue to perform allergen avoidance measures - dust mite / pollens  2.  Continue to treat and prevent infalammation:   A.  Arnuity 200 - 1 inhalation 1 time per day  B.  Flonase - 2 sprays each nostril 1-7 times per week  3.  Continue to treat and prevent reflux:   A.  Consolidate caffeine consumption  B.  Nexium 40 mg tablet in a.m.  4. If needed:   A.  Albuterol HFA-2 inhalations every 4-6 hours  B.  antihistamine- Clarinex   C.  OTC nasal saline  D.  OTC Systane eyedrops  E.  famotidine 40 mg tablet in p.m.    5. Return to clinic in 12 months or earlier if problem  6.  Obtain fall flu vaccine

## 2023-05-22 NOTE — Progress Notes (Unsigned)
Taylor Stanley - Taylor Stanley - Taylor Stanley - Taylor Stanley - Taylor Stanley   Follow-up Note  Referring Provider: Elie Confer, NP Primary Provider: Elie Confer, NP Date of Office Visit: 05/22/2023  Subjective:   Taylor Stanley (DOB: [birth year]) is a 67 y.o. female who [facility] in re-evaluation of the following:  HPI: Taylor Stanley presents to this clinic in evaluation of asthma, allergic rhinitis, LPR.  I last saw her in this clinic 16 May 2022.  She has really been doing very well with her asthma and her use of a short acting bronchodilator only revolves around the performance of exercise which is relatively rare.  She continues on Arnuity on a regular basis.  She has not required a systemic steroid to treat an exacerbation.  Her upper airway is doing well although she occasionally gets some stuffiness and some clear rhinorrhea.  She does not really use her nasal steroids very often.  She has not required a antibiotic treat an episode of sinusitis.  Her reflux has been under very good control while using just Nexium.  She does not need any famotidine.  She did obtain the RSV vaccine last year.  Allergies as of 05/22/2023       Reactions   Infliximab Other (See Comments)   Other reaction(s): malaise/headache after infusion   Penicillins Hives, Swelling, Rash   Poison Ivy Extract Hives, Rash        Medication List    albuterol 108 (90 Base) MCG/ACT inhaler Commonly known as: VENTOLIN HFA TAKE 2 PUFFS BY MOUTH EVERY 4 HOURS AS NEEDED   amLODipine 5 MG tablet Commonly known as: NORVASC Take 1 tablet (5 mg total) by mouth daily.   Arnuity Ellipta 200 MCG/ACT Aepb Generic drug: Fluticasone Furoate Inhale 1 (ONE) puff by mouth per day.   cholecalciferol 1000 units tablet Commonly known as: VITAMIN D Take 1,000 Units by mouth daily.   desloratadine 5 MG tablet Commonly known as: CLARINEX TAKE 1 TABLET BY MOUTH DAILY AS  NEEDED   esomeprazole 40 MG capsule Commonly known as: NEXIUM TAKE 1 CAPSULE BY MOUTH EVERY DAY IN THE MORNING   fluocinonide cream 0.05 % Commonly known as: LIDEX fluocinonide 0.05 % topical cream   fluticasone 50 MCG/ACT nasal spray Commonly known as: FLONASE SPRAY 1-2 SPRAYS INTO EACH NOSTRIL 1-7 times per week.   olmesartan-hydrochlorothiazide 40-25 MG tablet Commonly known as: BENICAR HCT Take 1 tablet by mouth daily.   rosuvastatin 20 MG tablet Commonly known as: CRESTOR Take 1 tablet (20 mg total) by mouth daily.   sertraline 100 MG tablet Commonly known as: ZOLOFT 100 mg daily. Take one half daily   SIMPONI ARIA IV Inject into the vein.   triamcinolone cream 0.1 % Commonly known as: KENALOG APPLY TO AFFECTED AREA TWICE A DAY    Past Medical History:  Diagnosis Date  . Anxiety   . Asthma    triggered by allergies; does not use inhaler daily  . Carpal tunnel syndrome of right wrist 11/2011  . Dental crowns present   . Depression   . GERD (gastroesophageal reflux disease)   . Hx of colonic polyps   . Hypertension    under control; has been on med. x 5 yrs.  . Osteopenia   . PONV (postoperative nausea and vomiting)   . Seasonal allergies   . Seizure (HCC) as an infant   x 1 - unknown cause  . Snores    denies  apnea  . TMJ disease   . Wears glasses     Past Surgical History:  Procedure Laterality Date  . CARPAL TUNNEL RELEASE  05/08/2007   left  . CARPAL TUNNEL RELEASE  12/12/2011   Procedure: CARPAL TUNNEL RELEASE;  Surgeon: Wyn Forster., MD;  Location: [facility];  Service: Orthopedics;  Laterality: Right;  . catarac removal right eye Right 05/2019  . CERVICAL DISC SURGERY  2011  . CESAREAN SECTION  10-94   BTL  . CHOLECYSTECTOMY  07/07/2005   lap. chole.  . ESOPHAGEAL DILATION  05/15/2003  . LUMBAR DISC SURGERY  early 1990's  . SPINE SURGERY    . TONSILLECTOMY AND ADENOIDECTOMY  as a child  . TRIGGER FINGER RELEASE Right  02/12/2014   Procedure: RELEASE TRIGGER FINGER/A-1 PULLEY RIGHT INDEX AND RIGHT LONG FINGERS;  Surgeon: Wyn Forster, MD;  Location: [facility];  Service: Orthopedics;  Laterality: Right;  . TUBAL LIGATION  1994  . WISDOM TOOTH EXTRACTION      Review of systems negative except as noted in HPI / PMHx or noted below:  Review of Systems  Constitutional: Negative.   HENT: Negative.    Eyes: Negative.   Respiratory: Negative.    Cardiovascular: Negative.   Gastrointestinal: Negative.   Genitourinary: Negative.   Musculoskeletal: Negative.   Skin: Negative.   Neurological: Negative.   Endo/Heme/Allergies: Negative.   Psychiatric/Behavioral: Negative.       Objective:   Vitals:   05/22/23 1055  BP: 118/84  Pulse: 62  Resp: 14  Temp: 98.2 F (36.8 C)  SpO2: 96%   Height: 5' (152.4 cm)  Weight: 157 lb 11.2 oz (71.5 kg)   Physical Exam Constitutional:      Appearance: She is not diaphoretic.  HENT:     Head: Normocephalic.     Right Ear: Tympanic membrane, ear canal and external ear normal.     Left Ear: Tympanic membrane, ear canal and external ear normal.     Nose: Nose normal. No mucosal edema or rhinorrhea.     Mouth/Throat:     Pharynx: Uvula midline. No oropharyngeal exudate.  Eyes:     Conjunctiva/sclera: Conjunctivae normal.  Neck:     Thyroid: No thyromegaly.     Trachea: Trachea normal. No tracheal tenderness or tracheal deviation.  Cardiovascular:     Rate and Rhythm: Normal rate and regular rhythm.     Heart sounds: Normal heart sounds, S1 normal and S2 normal. No murmur heard. Pulmonary:     Effort: No respiratory distress.     Breath sounds: Normal breath sounds. No stridor. No wheezing or rales.  Lymphadenopathy:     Head:     Right side of head: No tonsillar adenopathy.     Left side of head: No tonsillar adenopathy.     Cervical: No cervical adenopathy.  Skin:    Findings: No erythema or rash.     Nails: There is no  clubbing.  Neurological:     Mental Status: She is alert.    Diagnostics: Spirometry was performed and demonstrated an FEV1 of 1.50 at 77.7 % of predicted.  Assessment and Plan:   1. Asthma, well controlled, mild persistent   2. Perennial allergic rhinitis   3. Seasonal allergic rhinitis due to pollen   4. LPRD (laryngopharyngeal reflux disease)    1.  Continue to perform allergen avoidance measures - dust mite / pollens  2.  Continue to treat  and prevent infalammation:   A.  Arnuity 200 - 1 inhalation 1 time per day  B.  Flonase - 2 sprays each nostril 1-7 times per week  3.  Continue to treat and prevent reflux:   A.  Consolidate caffeine consumption  B.  Nexium 40 mg tablet in a.m.  4. If needed:   A.  Albuterol HFA-2 inhalations every 4-6 hours  B.  antihistamine- Clarinex   C.  OTC nasal saline  D.  OTC Systane eyedrops  E.  famotidine 40 mg tablet in p.m.    5. Return to clinic in 12 months or earlier if problem  6.  Obtain fall flu vaccine   Nickeya appears to be doing pretty well with a selection of anti-inflammatory agents for both her upper and lower airway and therapy directed against reflux as noted above.  She has a selection of agents that she can utilize should they be required as noted.  Assuming she does well with this plan I will see her back in this clinic in 1 year or earlier if there is a problem.  Laurette Schimke, MD Allergy / Immunology [facility]

## 2023-05-23 ENCOUNTER — Encounter: Payer: Self-pay | Admitting: Allergy and Immunology

## 2023-05-24 ENCOUNTER — Other Ambulatory Visit: Payer: Self-pay | Admitting: Allergy and Immunology

## 2023-06-02 ENCOUNTER — Other Ambulatory Visit: Payer: Self-pay | Admitting: Allergy and Immunology

## 2023-07-06 ENCOUNTER — Other Ambulatory Visit: Payer: Self-pay | Admitting: Allergy and Immunology

## 2023-07-17 ENCOUNTER — Other Ambulatory Visit: Payer: Self-pay

## 2023-07-17 MED ORDER — ALBUTEROL SULFATE HFA 108 (90 BASE) MCG/ACT IN AERS: INHALATION_SPRAY | RESPIRATORY_TRACT | 2 refills | Status: DC

## 2023-07-17 MED ORDER — FLUTICASONE PROPIONATE 50 MCG/ACT NA SUSP: NASAL | 5 refills | Status: DC

## 2023-10-05 ENCOUNTER — Telehealth: Payer: Self-pay | Admitting: Cardiology

## 2023-10-05 NOTE — Telephone Encounter (Signed)
 Left the pt a message to call the office back if further assistance needed.   Scheduling, if she returns a call back you can offer her a sooner appt on Dr. Damian Leavell schedule for next Monday 3/10 vs 3/14 date that is scheduled.   Off hand looking at her cardiac medications, there are none that would explain her symptoms she is experiencing.  Can further elaborate with the pt at a sooner office visit appt.

## 2023-10-05 NOTE — Telephone Encounter (Signed)
 New Message:       Patient says she have been having headaches and no energy. She said she was wondering if some of her medicine might be causing it. She wanted t o be seen. I made heran appointment for next Friday(10-12-23) with Dr Rosemary Holms.

## 2023-10-08 NOTE — Telephone Encounter (Signed)
 Patient is returning call. Requesting call back

## 2023-10-08 NOTE — Telephone Encounter (Signed)
 Pt advised and will see Dr Rosemary Holms 10/12/23.

## 2023-10-11 NOTE — Progress Notes (Unsigned)
  Cardiology Office Note:  .   Date:  10/11/2023  ID:  Taylor Stanley, DOB 1955-08-25, MRN [id number] PCP: Elie Confer, NP  [facility] Providers Cardiologist:  Truett Mainland, MD PCP: Elie Confer, NP  No chief complaint on file.     History of Present Illness: .    Taylor Stanley is a 68 y.o. female with hypertension, elevated CAC, nonobstructive CAD, mild AS, mild AI, family h/o AAA w/rupture    There were no vitals filed for this visit.   ROS: *** ROS   Studies Reviewed: .        *** Independently interpreted ***/202***: Chol ***, TG ***, HDL ***, LDL *** HbA1C ***% Hb *** Cr *** ***  Echocardiogram 11/27/2022: Normal LV systolic function with visual EF 60-65%. Left ventricle cavity is normal in size. Mild concentric hypertrophy of the left ventricle. Normal global wall motion. Normal diastolic filling pattern, normal LAP. Calculated EF 64%. Trileaflet aortic valve. Mild to moderate aortic stenosis. Mild (Grade I) aortic regurgitation. Moderate aortic valve leaflet calcification. Vmax 2.59 m/sec, mean PG 12.3 mmHg, AVA 1.44 cm by continuity equation Structurally normal mitral valve.  Mild (Grade I) mitral regurgitation. Structurally normal tricuspid valve with trace regurgitation. No evidence of pulmonary hypertension. Echocardiogram in 05/2021 reported mild aortic valve stenosis. Aortic valve area, by VTI measures 1.31 cm.  Aortic valve mean gradient measures 13.0 mmHg. Aortic valve Vmax measures 2.45 m/s.    Coronary CTA 02/2022: Total calcium: 322, 90% (Calcium score in parenthesis below). LM: Patent.(0) LAD: Mild stenosis (25-49%) due to noncalcified plaque at the mid segment. (40.6) LCx:  Patent. (8.55) RCA: Mild to moderate stenosis due to diffuse mixed and calcified plaque within the proximal to mid segment (accuracy may be reduced due to blooming artifact). (273)   CT FFR: LM: 0.99 LAD: Proximal FFR = 0.98, mid FFR =  0.95, distal FFR = 0.90 Ramus: Proximal FFR = 0.98, distal FFR = not modeled due to small caliber vessel. LCX: Proximal FFR = 0.98, distal FFR = 0.96 RCA: Proximal FFR = 1.0, mid FFR =0.93, distal FFR = 0.93   IMPRESSION: 1.  CT FFR analysis showed no significant stenosis.  Risk Assessment/Calculations:   {Does this patient have ATRIAL FIBRILLATION?[phone]}     Physical Exam:   Physical Exam   VISIT DIAGNOSES: No diagnosis found.   ASSESSMENT AND PLAN: .    Taylor Stanley is a 68 y.o. female with hypertension, elevated CAC, nonobstructive CAD, mild AS, mild AI, family h/o AAA w/rupture   *** CAD: Nonobstructive, in spite of CAC 312 (CCTA 02/2022). Chol 110, TG 86, HDL 36, LDL 57 (09/2022). Continue Crestor 20 mg daily. She takes Aspirin given remote h/o DVT. She does not daily daily Aspirin from CAD standpoint.    *** Fatigue: Resolved after stopping metoprolol.   *** Mild to mod AS, mild AI: Clinically asymptomatic.  Repeat echocardiogram in 04/2024.   *** Screening for AAA: Mild ectasia but no AAA and proximal abdominal aorta.  Right iliac artery velocity elevated suggests <50% stenosis versus tortuosity. No claudication symptoms to suspect severe PAD.  {Are you ordering a CV Procedure (e.g. stress test, cath, DCCV, TEE, etc)?   Press F2        :[id number]}    No orders of the defined types were placed in this encounter.    F/u in ***  Signed, Elder Negus, MD

## 2023-10-12 ENCOUNTER — Ambulatory Visit: Attending: Cardiology | Admitting: Cardiology

## 2023-10-12 ENCOUNTER — Encounter: Payer: Self-pay | Admitting: Cardiology

## 2023-10-12 VITALS — BP 148/76 | HR 62 | Resp 16 | Ht 60.0 in | Wt 161.0 lb

## 2023-10-12 DIAGNOSIS — E782 Mixed hyperlipidemia: Secondary | ICD-10-CM

## 2023-10-12 DIAGNOSIS — I351 Nonrheumatic aortic (valve) insufficiency: Secondary | ICD-10-CM

## 2023-10-12 DIAGNOSIS — R931 Abnormal findings on diagnostic imaging of heart and coronary circulation: Secondary | ICD-10-CM

## 2023-10-12 DIAGNOSIS — I10 Essential (primary) hypertension: Secondary | ICD-10-CM

## 2023-10-12 DIAGNOSIS — R0989 Other specified symptoms and signs involving the circulatory and respiratory systems: Secondary | ICD-10-CM

## 2023-10-12 DIAGNOSIS — I739 Peripheral vascular disease, unspecified: Secondary | ICD-10-CM | POA: Diagnosis present

## 2023-10-12 DIAGNOSIS — I350 Nonrheumatic aortic (valve) stenosis: Secondary | ICD-10-CM | POA: Diagnosis present

## 2023-10-12 DIAGNOSIS — M069 Rheumatoid arthritis, unspecified: Secondary | ICD-10-CM | POA: Insufficient documentation

## 2023-10-12 MED ORDER — NITROGLYCERIN 0.4 MG SL SUBL: 0.4000 mg | SUBLINGUAL_TABLET | SUBLINGUAL | 4 refills | Status: AC | PRN

## 2023-10-12 MED ORDER — CARVEDILOL 3.125 MG PO TABS: 3.1250 mg | ORAL_TABLET | Freq: Two times a day (BID) | ORAL | 2 refills | Status: DC

## 2023-10-12 MED ORDER — ASPIRIN 81 MG PO TBEC: 81.0000 mg | DELAYED_RELEASE_TABLET | Freq: Every day | ORAL | 2 refills | Status: AC

## 2023-10-12 NOTE — Patient Instructions (Signed)
 Medication Instructions:   START TAKING ASPIRIN 81 MG BY MOUTH DAILY  START TAKING CARVEDILOL (COREG) 3.125 MG BY MOUTH TWICE DAILY  START TAKING NITROGLYCERIN 0.4 MG SUBLINGUAL (UNDER THE TONGUE)--PLACE ONE TABLET UNDER THE TONGUE EVERY 5 MINS AS NEEDED FOR CHEST PAIN--IF YOU ARE GETTING TO TABLET #3 WITH NO CHEST PAIN RELIEF, PLEASE CALL 911 FOR FURTHER EVALUATION  *If you need a refill on your cardiac medications before your next appointment, please call your pharmacy*    Testing/Procedures:  Your physician has requested that you have an echocardiogram. Echocardiography is a painless test that uses sound waves to create images of your heart. It provides your doctor with information about the size and shape of your heart and how well your heart's chambers and valves are working. This procedure takes approximately one hour. There are no restrictions for this procedure. Please do NOT wear cologne, perfume, aftershave, or lotions (deodorant is allowed). Please arrive 15 minutes prior to your appointment time.  Please note: We ask at that you not bring children with you during ultrasound (echo/ vascular) testing. Due to room size and safety concerns, children are not allowed in the ultrasound rooms during exams. Our front office staff cannot provide observation of children in our lobby area while testing is being conducted. An adult accompanying a patient to their appointment will only be allowed in the ultrasound room at the discretion of the ultrasound technician under special circumstances. We apologize for any inconvenience.    Your physician has requested that you have a carotid duplex. This test is an ultrasound of the carotid arteries in your neck. It looks at blood flow through these arteries that supply the brain with blood. Allow one hour for this exam. There are no restrictions or special instructions.    Your physician has requested that you have a lower extremity arterial duplex.  This test is an ultrasound of the arteries in the legs or arms. It looks at arterial blood flow in the legs and arms. Allow one hour for Lower and Upper Arterial scans. There are no restrictions or special instructions.  Please note: We ask at that you not bring children with you during ultrasound (echo/ vascular) testing. Due to room size and safety concerns, children are not allowed in the ultrasound rooms during exams. Our front office staff cannot provide observation of children in our lobby area while testing is being conducted. An adult accompanying a patient to their appointment will only be allowed in the ultrasound room at the discretion of the ultrasound technician under special circumstances. We apologize for any inconvenience.  Your physician has requested that you have an ankle brachial index (ABI). During this test an ultrasound and blood pressure cuff are used to evaluate the arteries that supply the arms and legs with blood. Allow thirty minutes for this exam. There are no restrictions or special instructions.  Please note: We ask at that you not bring children with you during ultrasound (echo/ vascular) testing. Due to room size and safety concerns, children are not allowed in the ultrasound rooms during exams. Our front office staff cannot provide observation of children in our lobby area while testing is being conducted. An adult accompanying a patient to their appointment will only be allowed in the ultrasound room at the discretion of the ultrasound technician under special circumstances. We apologize for any inconvenience.    Follow-Up:  1.)  4-6 WEEKS WITH AN EXTENDER IN THE OFFICE  2.)  6 MONTHS WITH DR. PATWARDHAN

## 2023-10-19 ENCOUNTER — Telehealth: Payer: Self-pay | Admitting: Cardiology

## 2023-10-19 ENCOUNTER — Encounter: Payer: Self-pay | Admitting: Cardiology

## 2023-10-19 NOTE — Telephone Encounter (Signed)
-----   Message from Nurse Corky Crafts sent at 10/12/2023 11:53 AM EDT ----- Regarding: 4-6 WEEKS WITH AN EXTENDER PER PATWARDHAN Pt saw Dr. Rosemary Holms today and he wanted her to see an APP in 4-6 weeks  Can you please call and schedule and shoot me the date thereafter?   Thanks, [employer]

## 2023-10-19 NOTE — Telephone Encounter (Signed)
 LVM 3x to schedule f/u in 4-6 weeks, will send letter to have pt call office

## 2023-10-23 ENCOUNTER — Telehealth: Payer: Self-pay | Admitting: Cardiology

## 2023-10-23 NOTE — Telephone Encounter (Signed)
-----   Message from Nurse Corky Crafts sent at 10/12/2023 11:53 AM EDT ----- Regarding: 4-6 WEEKS WITH AN EXTENDER PER PATWARDHAN Pt saw Dr. Rosemary Holms today and he wanted her to see an APP in 4-6 weeks  Can you please call and schedule and shoot me the date thereafter?   Thanks, [employer]

## 2023-10-23 NOTE — Telephone Encounter (Signed)
  Attempted to contact patient three times with no response

## 2023-10-31 ENCOUNTER — Ambulatory Visit (HOSPITAL_COMMUNITY)
Admission: RE | Admit: 2023-10-31 | Discharge: 2023-10-31 | Disposition: A | Discharge status: Home / Self Care | Source: Ambulatory Visit | Attending: Cardiology | Admitting: Cardiology

## 2023-10-31 ENCOUNTER — Ambulatory Visit (HOSPITAL_BASED_OUTPATIENT_CLINIC_OR_DEPARTMENT_OTHER)
Admission: RE | Admit: 2023-10-31 | Discharge: 2023-10-31 | Disposition: A | Discharge status: Home / Self Care | Source: Ambulatory Visit | Attending: Cardiology | Admitting: Cardiology

## 2023-10-31 ENCOUNTER — Ambulatory Visit (HOSPITAL_BASED_OUTPATIENT_CLINIC_OR_DEPARTMENT_OTHER)
Admission: RE | Admit: 2023-10-31 | Discharge: 2023-10-31 | Disposition: A | Discharge status: Home / Self Care | Source: Ambulatory Visit | Attending: Cardiology

## 2023-10-31 DIAGNOSIS — I739 Peripheral vascular disease, unspecified: Secondary | ICD-10-CM | POA: Diagnosis present

## 2023-10-31 DIAGNOSIS — R0989 Other specified symptoms and signs involving the circulatory and respiratory systems: Secondary | ICD-10-CM | POA: Insufficient documentation

## 2023-10-31 LAB — VAS US ABI WITH/WO TBI
Left ABI: 1.17
Right ABI: 1.25

## 2023-11-01 ENCOUNTER — Encounter: Payer: Self-pay | Admitting: Cardiology

## 2023-11-02 ENCOUNTER — Ambulatory Visit (HOSPITAL_COMMUNITY): Attending: Cardiology

## 2023-11-02 DIAGNOSIS — I351 Nonrheumatic aortic (valve) insufficiency: Secondary | ICD-10-CM

## 2023-11-02 DIAGNOSIS — I350 Nonrheumatic aortic (valve) stenosis: Secondary | ICD-10-CM

## 2023-11-02 LAB — ECHOCARDIOGRAM COMPLETE
AR max vel: 0.75 cm2
AV Area VTI: 0.84 cm2
AV Area mean vel: 0.92 cm2
AV Mean grad: 33 mmHg
AV Peak grad: 63.4 mmHg
Ao pk vel: 3.98 m/s
Area-P 1/2: 2.26 cm2
S' Lateral: 2.5 cm

## 2023-11-05 NOTE — Progress Notes (Signed)
 Aortic stenosis is now severe. This likely explains symptoms of chest pain and exertional dyspnea. I do think we should proceed with left and right heart catheterization and referral to valve clinic for aortic valve replacement discussion. Happy to discuss this in person before proceeding, if patient would prefer.  Thanks MJP

## 2023-11-06 ENCOUNTER — Telehealth: Payer: Self-pay | Admitting: Cardiology

## 2023-11-06 NOTE — Telephone Encounter (Signed)
Pt is returning nurse call regarding results and is requesting a callback. Please advise

## 2023-11-06 NOTE — Telephone Encounter (Signed)
 Spoke with pt and advised of results and recommendation as below per Dr Rosemary Holms.  Pt verbalizes understanding and states she would appreciate an appointment to further discuss.  Pt advised will forward to Dr Damian Leavell nurse to assist with finding appointment.  Pt verbalizes understanding and thanked Charity fundraiser for the call.   Aortic stenosis is now severe. This likely explains symptoms of chest pain and exertional dyspnea. I do think we should proceed with left and right heart catheterization and referral to valve clinic for aortic valve replacement discussion. Happy to discuss this in person before proceeding, if patient would prefer.   Thanks MJP

## 2023-11-07 NOTE — Telephone Encounter (Signed)
 Pt contacted and appt scheduled for 11/09/23.

## 2023-11-09 ENCOUNTER — Ambulatory Visit: Attending: Cardiology | Admitting: Cardiology

## 2023-11-09 ENCOUNTER — Other Ambulatory Visit: Payer: Self-pay

## 2023-11-09 ENCOUNTER — Encounter: Payer: Self-pay | Admitting: Cardiology

## 2023-11-09 VITALS — BP 112/60 | HR 62 | Resp 16 | Ht 60.0 in | Wt 162.0 lb

## 2023-11-09 DIAGNOSIS — I350 Nonrheumatic aortic (valve) stenosis: Secondary | ICD-10-CM

## 2023-11-09 DIAGNOSIS — G8929 Other chronic pain: Secondary | ICD-10-CM

## 2023-11-09 DIAGNOSIS — R519 Headache, unspecified: Secondary | ICD-10-CM

## 2023-11-09 NOTE — Progress Notes (Signed)
 Cardiology Office Note:  .   Date:  11/09/2023  ID:  ANITA LAGUNA, DOB 04-29-1956, MRN [id number] PCP: Elie Confer, NP  [facility] Providers Cardiologist:  Truett Mainland, MD PCP: Elie Confer, NP  Chief Complaint  Patient presents with   Coronary Artery Disease    Discuss heart cath      History of Present Illness: .    Taylor Stanley is a 68 y.o. female with hypertension, rheumatoid arthritis, elevated CAC, nonobstructive CAD, AS/AI   Patient is here today with her husband.  She continues to have exertional dyspnea, has not had any chest pain.  Reviewed results of recent echocardiogram with the patient, details below.  Separately, patient continues to have left-sided headaches, without any visual changes or jaw claudication.   Vitals:   11/09/23 1021  BP: 112/60  Pulse: 62  Resp: 16  SpO2: 95%      ROS:  Review of Systems  Cardiovascular:  Positive for chest pain and dyspnea on exertion. Negative for leg swelling, palpitations and syncope.  Neurological:  Positive for headaches.     Studies Reviewed: Marland Kitchen        EKG 10/12/2023:  Sinus rhythm 57 bpm Moderate voltage criteria for LVH, may be normal variant ( R in aVL , Cornell product ) When compared with ECG of 05-Oct-2017 13:23, No significant change was found    Independently interpreted 08/2023: Chol 120, TG 83, HDL 39, LDL 57 HbA1C 5.7% Hb 12.6 Cr 0.7   Echocardiogram 10/2023:  1. Left ventricular ejection fraction, by estimation, is 70 to 75%. The  left ventricle has hyperdynamic function. The left ventricle has no  regional wall motion abnormalities. Left ventricular diastolic parameters  are consistent with Grade I diastolic  dysfunction (impaired relaxation). The average left ventricular global  longitudinal strain is -19.4 %. The global longitudinal strain is normal.   2. Right ventricular systolic function is normal. The right ventricular  size is normal.   3. The  mitral valve is normal in structure. Mild mitral valve  regurgitation. No evidence of mitral stenosis.   4. DVI: 0.24. The aortic valve is abnormal. There is severe calcifcation  of the aortic valve. There is severe thickening of the aortic valve.  Aortic valve regurgitation is mild. Severe aortic valve stenosis. Aortic  valve area, by VTI measures 0.84 cm. Aortic valve mean gradient measures 33.0 mmHg. Aortic valve Vmax measures 3.98 m/s.   5. The inferior vena cava is normal in size with greater than 50%  respiratory variability, suggesting right atrial pressure of 3 mmHg.     Coronary CTA 02/2022: Total calcium: 322, 90% (Calcium score in parenthesis below). LM: Patent.(0) LAD: Mild stenosis (25-49%) due to noncalcified plaque at the mid segment. (40.6) LCx:  Patent. (8.55) RCA: Mild to moderate stenosis due to diffuse mixed and calcified plaque within the proximal to mid segment (accuracy may be reduced due to blooming artifact). (273)   CT FFR: LM: 0.99 LAD: Proximal FFR = 0.98, mid FFR = 0.95, distal FFR = 0.90 Ramus: Proximal FFR = 0.98, distal FFR = not modeled due to small caliber vessel. LCX: Proximal FFR = 0.98, distal FFR = 0.96 RCA: Proximal FFR = 1.0, mid FFR =0.93, distal FFR = 0.93   IMPRESSION: 1.  CT FFR analysis showed no significant stenosis.     Physical Exam:   Physical Exam Vitals and nursing note reviewed.  Constitutional:      General: She is not  in acute distress. Neck:     Vascular: No JVD.  Cardiovascular:     Rate and Rhythm: Normal rate and regular rhythm.     Heart sounds: Murmur heard.     Harsh midsystolic murmur is present with a grade of 3/6 at the upper right sternal border radiating to the neck.  Pulmonary:     Effort: Pulmonary effort is normal.     Breath sounds: Normal breath sounds. No wheezing or rales.  Musculoskeletal:     Right lower leg: No edema.     Left lower leg: No edema.      VISIT DIAGNOSES:   ICD-10-CM   1.  Nonrheumatic aortic valve stenosis  I35.0 CBC    Comprehensive metabolic panel with GFR    Ambulatory referral to Cardiology    2. Chronic intractable headache, unspecified headache type  R51.9 Sedimentation rate   G89.29 C-reactive protein        ASSESSMENT AND PLAN: .    Taylor Stanley is a 68 y.o. female with hypertension, rheumatoid arthritis, elevated CAC, nonobstructive CAD, AS/AI   Severe aortic stenosis, symptomatic stage D: Significant worsening of aortic stenosis severity from mild in 10/2022, to severe now. Given her symptoms of exertional chest pain and dyspnea, recommend workup for potential aortic valve replacement in the near future. Recommend right heart catheterization and coronary angiography, and referral to structural valve clinic.  CAD: Previously nonobstructive in 2023. Continue aspirin 81 mg daily, along with Crestor 10 mg daily. Upcoming cath later this month.  Mixed hyperlipidemia: Chol 120, TG 83, HDL 39, LDL 57 Continue Crestor 20 mg daily.  Bilateral leg pain: No significant PAD on LEA duplex,. Suspect nonvascular cause.  Carotid bruit: Likely conducted murmur for aortic stenosis.  No significant carotid artery disease seen.  Headache: Intermittent left-sided headaches, but without any jaw claudication or vision changes.  Low suspicion for temporal arteritis.  I will check ESR and CRP.  If significantly elevated, may need to consider workup for temporal arteritis down the road, but overall clinical suspicion remains low given the chronicity of symptoms.  In future, could also consider seeing a neurologist.         Informed Consent   Shared Decision Making/Informed Consent The risks [stroke (1 in 1000), death (1 in 1000), kidney failure [usually temporary] (1 in 500), bleeding (1 in 200), allergic reaction [possibly serious] (1 in 200)], benefits (diagnostic support and management of coronary artery disease) and alternatives of a cardiac  catheterization were discussed in detail with Ms. Kotecki and she is willing to proceed.      No orders of the defined types were placed in this encounter.    F/u Robin Searing, NP in 11/2023. F/u w/me in 3 months.  Signed, Elder Negus, MD

## 2023-11-09 NOTE — Patient Instructions (Signed)
 Lab Work: Cbc Cmp Esr Crp  If you have labs (blood work) drawn today and your tests are completely normal, you will receive your results only by: [employer] (if you have MyChart) OR A paper copy in the mail If you have any lab test that is abnormal or we need to change your treatment, we will call you to review the results.  Testing/Procedures: RIGHT HEART CATH WITH CORONARY ANGIO  REFERRAL TO STRUCTURAL CLINIC  Follow-Up: At [facility], you and your health needs are our priority.  As part of our continuing mission to provide you with exceptional heart care, our providers are all part of one team.  This team includes your primary Cardiologist (physician) and Advanced Practice Providers or APPs (Physician Assistants and Nurse Practitioners) who all work together to provide you with the care you need, when you need it.  Your next appointment:   3 month(s)  Provider:   Elder Negus, MD     KEEP APPOINTMENT WITH ERNEST, NP AS SCHEDULED   We recommend signing up for the patient portal called "MyChart".  Sign up information is provided on this After Visit Summary.  MyChart is used to connect with patients for Virtual Visits (Telemedicine).  Patients are able to view lab/test results, encounter notes, upcoming appointments, etc.  Non-urgent messages can be sent to your provider as well.   To learn more about what you can do with MyChart, go to [email or web address].   Other Instructions  [facility] A DEPT OF [place]. Taylor Stanley [address] [place], [place] [address] [place] [address] Dept: [phone] Loc: [phone]  Taylor Stanley  11/09/2023  You are scheduled for a Cardiac Catheterization on Monday, April 28 with Dr.  Rosemary Holms .  1. Please arrive at the [facility] (Main Entrance A) at [facility]: [address] [place], [place] [address] at 8:30 AM (This time is 2 hour(s) before  your procedure to ensure your preparation).   Free valet parking service is available. You will check in at ADMITTING. The support person will be asked to wait in the waiting room.  It is OK to have someone drop you off and come back when you are ready to be discharged.    Special note: Every effort is made to have your procedure done on time. Please understand that emergencies sometimes delay scheduled procedures.  2. Diet: Do not eat solid foods after midnight.  The patient may have clear liquids until 5am upon the day of the procedure.  3. Labs: You will need to have blood drawn at [facility].  4. Medication instructions in preparation for your procedure:   Contrast Allergy: No  Stop taking, Benicar (olmesartan hydrochlorothiazide) Monday, April 28,  On the morning of your procedure, take your Aspirin 81 mg and any morning medicines NOT listed above.  You may use sips of water.  5. Plan to go home the same day, you will only stay overnight if medically necessary. 6. Bring a current list of your medications and current insurance cards. 7. You MUST have a responsible person to drive you home. 8. Someone MUST be with you the first 24 hours after you arrive home or your discharge will be delayed. 9. Please wear clothes that are easy to get on and off and wear slip-on shoes.  Thank you for allowing [place] to care for you!   -- [facility]  1st Floor: - Lobby - Registration  - Pharmacy  - Lab - Cafe  2nd Floor: - PV Lab - Diagnostic Testing (echo, CT, nuclear med)  3rd Floor: - Vacant  4th Floor: - TCTS (cardiothoracic surgery) - AFib Clinic - Structural Heart Clinic - Vascular Surgery  - Vascular Ultrasound  5th Floor: - HeartCare Cardiology (general and EP) - Clinical Pharmacy for coumadin, hypertension, lipid, weight-loss medications, and med management appointments    Valet parking services will be available as well.

## 2023-11-10 ENCOUNTER — Encounter: Payer: Self-pay | Admitting: Cardiology

## 2023-11-10 LAB — CBC
Hematocrit: 40.7 % (ref 34.0–46.6)
Hemoglobin: 13.8 g/dL (ref 11.1–15.9)
MCH: 31.7 pg (ref 26.6–33.0)
MCHC: 33.9 g/dL (ref 31.5–35.7)
MCV: 94 fL (ref 79–97)
Platelets: 244 10*3/uL (ref 150–450)
RBC: 4.35 x10E6/uL (ref 3.77–5.28)
RDW: 12.4 % (ref 11.7–15.4)
WBC: 8.7 10*3/uL (ref 3.4–10.8)

## 2023-11-10 LAB — COMPREHENSIVE METABOLIC PANEL WITH GFR
ALT: 46 IU/L — ABNORMAL HIGH (ref 0–32)
AST: 50 IU/L — ABNORMAL HIGH (ref 0–40)
Albumin: 4.7 g/dL (ref 3.9–4.9)
Alkaline Phosphatase: 111 IU/L (ref 44–121)
BUN/Creatinine Ratio: 19 (ref 12–28)
BUN: 14 mg/dL (ref 8–27)
Bilirubin Total: 0.4 mg/dL (ref 0.0–1.2)
CO2: 26 mmol/L (ref 20–29)
Calcium: 9.9 mg/dL (ref 8.7–10.3)
Chloride: 100 mmol/L (ref 96–106)
Creatinine, Ser: 0.72 mg/dL (ref 0.57–1.00)
Globulin, Total: 2.6 g/dL (ref 1.5–4.5)
Glucose: 105 mg/dL — ABNORMAL HIGH (ref 70–99)
Potassium: 4.4 mmol/L (ref 3.5–5.2)
Sodium: 142 mmol/L (ref 134–144)
Total Protein: 7.3 g/dL (ref 6.0–8.5)
eGFR: 90 mL/min/{1.73_m2} (ref >59)

## 2023-11-10 LAB — C-REACTIVE PROTEIN: CRP: 2 mg/L (ref 0–10)

## 2023-11-10 LAB — SEDIMENTATION RATE: Sed Rate: 3 mm/h (ref 0–40)

## 2023-11-22 ENCOUNTER — Telehealth: Payer: Self-pay | Admitting: *Deleted

## 2023-11-22 NOTE — Telephone Encounter (Signed)
 Cardiac Catheterization scheduled at [facility] for: Monday November 26, 2023 10:30 AM Arrival time [address] [facility] Main Entrance A at: 8:30 AM  Nothing to eat after midnight prior to procedure, clear liquids until 5 AM day of procedure.  Medication instructions: -Hold:  Olmesartan-hydrochlorothiazide-AM of procedure -Other usual morning medications can be taken with sips of water including aspirin  81 mg.  Plan to go home the same day, you will only stay overnight if medically necessary.  You must have responsible adult to drive you home.  Someone must be with you the first 24 hours after you arrive home.  Reviewed procedure instructions with patient.

## 2023-11-26 ENCOUNTER — Encounter (HOSPITAL_COMMUNITY): Payer: Self-pay | Admitting: Cardiology

## 2023-11-26 ENCOUNTER — Other Ambulatory Visit: Payer: Self-pay

## 2023-11-26 ENCOUNTER — Ambulatory Visit (HOSPITAL_COMMUNITY)
Admission: RE | Admit: 2023-11-26 | Discharge: 2023-11-26 | Disposition: A | Discharge status: Home / Self Care | Attending: Cardiology | Admitting: Cardiology

## 2023-11-26 ENCOUNTER — Encounter (HOSPITAL_COMMUNITY)
Admission: RE | Disposition: A | Discharge status: Home / Self Care | Payer: Self-pay | Source: Home / Self Care | Attending: Cardiology

## 2023-11-26 DIAGNOSIS — I10 Essential (primary) hypertension: Secondary | ICD-10-CM | POA: Insufficient documentation

## 2023-11-26 DIAGNOSIS — R519 Headache, unspecified: Secondary | ICD-10-CM | POA: Diagnosis not present

## 2023-11-26 DIAGNOSIS — R0989 Other specified symptoms and signs involving the circulatory and respiratory systems: Secondary | ICD-10-CM | POA: Insufficient documentation

## 2023-11-26 DIAGNOSIS — I2510 Atherosclerotic heart disease of native coronary artery without angina pectoris: Secondary | ICD-10-CM | POA: Diagnosis not present

## 2023-11-26 DIAGNOSIS — I350 Nonrheumatic aortic (valve) stenosis: Secondary | ICD-10-CM | POA: Insufficient documentation

## 2023-11-26 DIAGNOSIS — M79605 Pain in left leg: Secondary | ICD-10-CM | POA: Insufficient documentation

## 2023-11-26 DIAGNOSIS — M79604 Pain in right leg: Secondary | ICD-10-CM | POA: Diagnosis not present

## 2023-11-26 DIAGNOSIS — Z7982 Long term (current) use of aspirin: Secondary | ICD-10-CM | POA: Diagnosis not present

## 2023-11-26 DIAGNOSIS — Z79899 Other long term (current) drug therapy: Secondary | ICD-10-CM | POA: Insufficient documentation

## 2023-11-26 DIAGNOSIS — M069 Rheumatoid arthritis, unspecified: Secondary | ICD-10-CM | POA: Diagnosis not present

## 2023-11-26 DIAGNOSIS — E782 Mixed hyperlipidemia: Secondary | ICD-10-CM | POA: Diagnosis not present

## 2023-11-26 HISTORY — PX: RIGHT HEART CATH AND CORONARY ANGIOGRAPHY: CATH118264

## 2023-11-26 LAB — POCT I-STAT EG7
Acid-Base Excess: 2 mmol/L (ref 0.0–2.0)
Acid-Base Excess: 3 mmol/L — ABNORMAL HIGH (ref 0.0–2.0)
Bicarbonate: 27.7 mmol/L (ref 20.0–28.0)
Bicarbonate: 28.2 mmol/L — ABNORMAL HIGH (ref 20.0–28.0)
Calcium, Ion: 1.19 mmol/L (ref 1.15–1.40)
Calcium, Ion: 1.2 mmol/L (ref 1.15–1.40)
HCT: 34 % — ABNORMAL LOW (ref 36.0–46.0)
HCT: 35 % — ABNORMAL LOW (ref 36.0–46.0)
Hemoglobin: 11.6 g/dL — ABNORMAL LOW (ref 12.0–15.0)
Hemoglobin: 11.9 g/dL — ABNORMAL LOW (ref 12.0–15.0)
O2 Saturation: 75 %
O2 Saturation: 76 %
Potassium: 3.7 mmol/L (ref 3.5–5.1)
Potassium: 3.7 mmol/L (ref 3.5–5.1)
Sodium: 142 mmol/L (ref 135–145)
Sodium: 143 mmol/L (ref 135–145)
TCO2: 29 mmol/L (ref 22–32)
TCO2: 30 mmol/L (ref 22–32)
pCO2, Ven: 46.6 mmHg (ref 44–60)
pCO2, Ven: 47 mmHg (ref 44–60)
pH, Ven: 7.382 (ref 7.25–7.43)
pH, Ven: 7.387 (ref 7.25–7.43)
pO2, Ven: 41 mmHg (ref 32–45)
pO2, Ven: 42 mmHg (ref 32–45)

## 2023-11-26 LAB — POCT I-STAT 7, (LYTES, BLD GAS, ICA,H+H)
Acid-Base Excess: 1 mmol/L (ref 0.0–2.0)
Bicarbonate: 25.9 mmol/L (ref 20.0–28.0)
Calcium, Ion: 1.17 mmol/L (ref 1.15–1.40)
HCT: 34 % — ABNORMAL LOW (ref 36.0–46.0)
Hemoglobin: 11.6 g/dL — ABNORMAL LOW (ref 12.0–15.0)
O2 Saturation: 96 %
Potassium: 3.7 mmol/L (ref 3.5–5.1)
Sodium: 142 mmol/L (ref 135–145)
TCO2: 27 mmol/L (ref 22–32)
pCO2 arterial: 40.2 mmHg (ref 32–48)
pH, Arterial: 7.418 (ref 7.35–7.45)
pO2, Arterial: 82 mmHg — ABNORMAL LOW (ref 83–108)

## 2023-11-26 SURGERY — RIGHT HEART CATH AND CORONARY ANGIOGRAPHY
Anesthesia: LOCAL

## 2023-11-26 MED ORDER — ASPIRIN 81 MG PO CHEW: 81.0000 mg | CHEWABLE_TABLET | ORAL | Status: DC

## 2023-11-26 MED ORDER — LIDOCAINE HCL (PF) 1 % IJ SOLN
INTRAMUSCULAR | Status: AC
  Filled 2023-11-26: qty 30

## 2023-11-26 MED ORDER — IOHEXOL 350 MG/ML SOLN
INTRAVENOUS | Status: DC | PRN
  Administered 2023-11-26: 25 mL

## 2023-11-26 MED ORDER — SODIUM CHLORIDE 0.9% FLUSH: 3.0000 mL | INTRAVENOUS | Status: DC | PRN

## 2023-11-26 MED ORDER — HYDRALAZINE HCL 20 MG/ML IJ SOLN: 10.0000 mg | INTRAMUSCULAR | Status: DC | PRN

## 2023-11-26 MED ORDER — VERAPAMIL HCL 2.5 MG/ML IV SOLN
INTRAVENOUS | Status: DC | PRN
  Administered 2023-11-26: 10 mL via INTRA_ARTERIAL

## 2023-11-26 MED ORDER — LIDOCAINE HCL (PF) 1 % IJ SOLN
INTRAMUSCULAR | Status: DC | PRN
  Administered 2023-11-26: 2 mL via INTRADERMAL

## 2023-11-26 MED ORDER — MIDAZOLAM HCL 2 MG/2ML IJ SOLN
INTRAMUSCULAR | Status: DC | PRN
  Administered 2023-11-26: 1 mg via INTRAVENOUS

## 2023-11-26 MED ORDER — LABETALOL HCL 5 MG/ML IV SOLN: 10.0000 mg | INTRAVENOUS | Status: DC | PRN

## 2023-11-26 MED ORDER — VERAPAMIL HCL 2.5 MG/ML IV SOLN
INTRAVENOUS | Status: AC
  Filled 2023-11-26: qty 2

## 2023-11-26 MED ORDER — HEPARIN SODIUM (PORCINE) 1000 UNIT/ML IJ SOLN
INTRAMUSCULAR | Status: DC | PRN
Start: 2023-11-26 — End: 2023-11-26
  Administered 2023-11-26: 3500 [IU] via INTRAVENOUS

## 2023-11-26 MED ORDER — SODIUM CHLORIDE 0.9 % WEIGHT BASED INFUSION: 1.0000 mL/kg/h | INTRAVENOUS | Status: DC

## 2023-11-26 MED ORDER — HEPARIN (PORCINE) IN NACL 1000-0.9 UT/500ML-% IV SOLN
INTRAVENOUS | Status: DC | PRN
  Administered 2023-11-26 (×2): 500 mL

## 2023-11-26 MED ORDER — SODIUM CHLORIDE 0.9% FLUSH: 3.0000 mL | Freq: Two times a day (BID) | INTRAVENOUS | Status: DC

## 2023-11-26 MED ORDER — SODIUM CHLORIDE 0.9 % IV SOLN: 250.0000 mL | INTRAVENOUS | Status: DC | PRN

## 2023-11-26 MED ORDER — FENTANYL CITRATE (PF) 100 MCG/2ML IJ SOLN
INTRAMUSCULAR | Status: DC | PRN
  Administered 2023-11-26: 25 ug via INTRAVENOUS

## 2023-11-26 MED ORDER — HEPARIN SODIUM (PORCINE) 1000 UNIT/ML IJ SOLN
INTRAMUSCULAR | Status: AC
  Filled 2023-11-26: qty 10

## 2023-11-26 MED ORDER — FENTANYL CITRATE (PF) 100 MCG/2ML IJ SOLN
INTRAMUSCULAR | Status: AC
  Filled 2023-11-26: qty 2

## 2023-11-26 MED ORDER — SODIUM CHLORIDE 0.9 % IV SOLN: INTRAVENOUS | Status: DC

## 2023-11-26 MED ORDER — ONDANSETRON HCL 4 MG/2ML IJ SOLN: 4.0000 mg | Freq: Four times a day (QID) | INTRAMUSCULAR | Status: DC | PRN

## 2023-11-26 MED ORDER — MIDAZOLAM HCL 2 MG/2ML IJ SOLN
INTRAMUSCULAR | Status: AC
  Filled 2023-11-26: qty 2

## 2023-11-26 MED ORDER — ACETAMINOPHEN 325 MG PO TABS: 650.0000 mg | ORAL_TABLET | ORAL | Status: DC | PRN

## 2023-11-26 MED ORDER — SODIUM CHLORIDE 0.9 % WEIGHT BASED INFUSION: 3.0000 mL/kg/h | INTRAVENOUS | Status: AC

## 2023-11-26 SURGICAL SUPPLY — 9 items
CATH INFINITI AMBI 5FR TG (CATHETERS) IMPLANT
CATH SWAN GANZ 7F STRAIGHT (CATHETERS) IMPLANT
DEVICE RAD COMP TR BAND LRG (VASCULAR PRODUCTS) IMPLANT
GLIDESHEATH SLEND A-KIT 6F 22G (SHEATH) IMPLANT
GLIDESHEATH SLENDER 7FR .021G (SHEATH) IMPLANT
GUIDEWIRE INQWIRE 1.5J.035X260 (WIRE) IMPLANT
PACK CARDIAC CATHETERIZATION (CUSTOM PROCEDURE TRAY) ×1 IMPLANT
SET ATX-X65L (MISCELLANEOUS) IMPLANT
SHEATH GLIDE SLENDER 4/5FR (SHEATH) IMPLANT

## 2023-11-26 NOTE — H&P (Signed)
 OV 11/09/2023 copied for documentation   Cardiology Office Note:  .   Date:  11/26/2023  ID:  Taylor Stanley, DOB Jan 07, 1956, MRN [id number] PCP: Verlene Glimpse, NP  [facility] Providers Cardiologist:  Fransico Ivy, MD PCP: Verlene Glimpse, NP  CC: Severe aortic stenosis     History of Present Illness: .    Taylor Stanley is a 68 y.o. female with hypertension, rheumatoid arthritis, elevated CAC, nonobstructive CAD, AS/AI   Patient is here today with her husband.  She continues to have exertional dyspnea, has not had any chest pain.  Reviewed results of recent echocardiogram with the patient, details below.  Separately, patient continues to have left-sided headaches, without any visual changes or jaw claudication.   There were no vitals filed for this visit.     ROS:  Review of Systems  Cardiovascular:  Positive for chest pain and dyspnea on exertion. Negative for leg swelling, palpitations and syncope.  Neurological:  Positive for headaches.     Studies Reviewed: Aaron Aas        EKG 10/12/2023:  Sinus rhythm 57 bpm Moderate voltage criteria for LVH, may be normal variant ( R in aVL , Cornell product ) When compared with ECG of 05-Oct-2017 13:23, No significant change was found    Independently interpreted 08/2023: Chol 120, TG 83, HDL 39, LDL 57 HbA1C 5.7% Hb 12.6 Cr 0.7   Echocardiogram 10/2023:  1. Left ventricular ejection fraction, by estimation, is 70 to 75%. The  left ventricle has hyperdynamic function. The left ventricle has no  regional wall motion abnormalities. Left ventricular diastolic parameters  are consistent with Grade I diastolic  dysfunction (impaired relaxation). The average left ventricular global  longitudinal strain is -19.4 %. The global longitudinal strain is normal.   2. Right ventricular systolic function is normal. The right ventricular  size is normal.   3. The mitral valve is normal in structure. Mild mitral valve   regurgitation. No evidence of mitral stenosis.   4. DVI: 0.24. The aortic valve is abnormal. There is severe calcifcation  of the aortic valve. There is severe thickening of the aortic valve.  Aortic valve regurgitation is mild. Severe aortic valve stenosis. Aortic  valve area, by VTI measures 0.84 cm. Aortic valve mean gradient measures 33.0 mmHg. Aortic valve Vmax measures 3.98 m/s.   5. The inferior vena cava is normal in size with greater than 50%  respiratory variability, suggesting right atrial pressure of 3 mmHg.     Coronary CTA 02/2022: Total calcium : 322, 90% (Calcium  score in parenthesis below). LM: Patent.(0) LAD: Mild stenosis (25-49%) due to noncalcified plaque at the mid segment. (40.6) LCx:  Patent. (8.55) RCA: Mild to moderate stenosis due to diffuse mixed and calcified plaque within the proximal to mid segment (accuracy may be reduced due to blooming artifact). (273)   CT FFR: LM: 0.99 LAD: Proximal FFR = 0.98, mid FFR = 0.95, distal FFR = 0.90 Ramus: Proximal FFR = 0.98, distal FFR = not modeled due to small caliber vessel. LCX: Proximal FFR = 0.98, distal FFR = 0.96 RCA: Proximal FFR = 1.0, mid FFR =0.93, distal FFR = 0.93   IMPRESSION: 1.  CT FFR analysis showed no significant stenosis.     Physical Exam:   Physical Exam Vitals and nursing note reviewed.  Constitutional:      General: She is not in acute distress. Neck:     Vascular: No JVD.  Cardiovascular:  Rate and Rhythm: Normal rate and regular rhythm.     Heart sounds: Murmur heard.     Harsh midsystolic murmur is present with a grade of 3/6 at the upper right sternal border radiating to the neck.  Pulmonary:     Effort: Pulmonary effort is normal.     Breath sounds: Normal breath sounds. No wheezing or rales.  Musculoskeletal:     Right lower leg: No edema.     Left lower leg: No edema.      VISIT DIAGNOSES: No diagnosis found.     ASSESSMENT AND PLAN: .    Taylor Stanley is a 68 y.o. female with hypertension, rheumatoid arthritis, elevated CAC, nonobstructive CAD, AS/AI   Severe aortic stenosis, symptomatic stage D: Significant worsening of aortic stenosis severity from mild in 10/2022, to severe now. Given her symptoms of exertional chest pain and dyspnea, recommend workup for potential aortic valve replacement in the near future. Recommend right heart catheterization and coronary angiography, and referral to structural valve clinic.  CAD: Previously nonobstructive in 2023. Continue aspirin  81 mg daily, along with Crestor  10 mg daily. Upcoming cath later this month.  Mixed hyperlipidemia: Chol 120, TG 83, HDL 39, LDL 57 Continue Crestor  20 mg daily.  Bilateral leg pain: No significant PAD on LEA duplex,. Suspect nonvascular cause.  Carotid bruit: Likely conducted murmur for aortic stenosis.  No significant carotid artery disease seen.  Headache: Intermittent left-sided headaches, but without any jaw claudication or vision changes.  Low suspicion for temporal arteritis.  I will check ESR and CRP.  If significantly elevated, may need to consider workup for temporal arteritis down the road, but overall clinical suspicion remains low given the chronicity of symptoms.  In future, could also consider seeing a neurologist.         Informed Consent   Shared Decision Making/Informed Consent The risks [stroke (1 in 1000), death (1 in 1000), kidney failure [usually temporary] (1 in 500), bleeding (1 in 200), allergic reaction [possibly serious] (1 in 200)], benefits (diagnostic support and management of coronary artery disease) and alternatives of a cardiac catheterization were discussed in detail with Taylor Stanley and she is willing to proceed.      No orders of the defined types were placed in this encounter.    F/u Charles Connor, NP in 11/2023. F/u w/me in 3 months.  Signed, Cody Das, MD

## 2023-11-26 NOTE — Progress Notes (Signed)
 Patient ID: Taylor Stanley MRN: [id number] DOB/AGE: July 19, 1956 68 y.o.  Primary Care Physician:Morton, Pearla Bottom, NP Primary Cardiologist: Filiberto Hug  CC:  Aortic valvular disease management     FOCUSED PROBLEM LIST:   Aortic stenosis AVA 0.84, DVI 0.24, MG 33, V-max 3.98, EF 70 to 75% TTE April 2025 EKG sinus rhythm no bundle-branch blocks CAD Mild to moderate; coronary CTA 2023 Rheumatoid arthritis On golimumab immunotherapy Hypertension Hyperlipidemia LP(a) 26 Aortic atherosclerosis Chest CTA 2023 CKD stage II BMI 31/BSA 1.06 Dec 2023:  Patient consents to use of AI scribe. The patient is a 68 year old female with above listed medical problems referred by Dr. Patwarden for recommendations regarding her aortic valvular disease.  An echocardiogram was done recently which demonstrated severe aortic stenosis with a preserved ejection fraction.  She also reports exertional dyspnea but no chest discomfort.  She has been experiencing shortness of breath and decreased energy levels over the past few months. She becomes winded with exertion, such as when working in the yard or cleaning, activities she previously could perform without difficulty. This decline in her ability to perform daily activities began approximately three months ago.  She also experiences chest discomfort when walking and has noticed difficulty breathing when lying flat, requiring her to elevate herself slightly. No lightheadedness, blacking out spells, or swelling in her legs. Her daughter has observed that she breathes heavier than normal, even when not exerting herself.  Her past medical history includes rheumatoid arthritis, for which she has been on golimumab (Simponi) for at least two years, with some relief of symptoms though she still experiences aches.   Denies dental complaints         Past Medical History:  Diagnosis Date   Anxiety    Asthma    triggered by allergies; does not use inhaler  daily   Carpal tunnel syndrome of right wrist 11/2011   Dental crowns present    Depression    GERD (gastroesophageal reflux disease)    Hx of colonic polyps    Hypertension    under control; has been on med. x 5 yrs.   Osteopenia    PONV (postoperative nausea and vomiting)    Seasonal allergies    Seizure (HCC) as an infant   x 1 - unknown cause   Snores    denies apnea   TMJ disease    Wears glasses     Past Surgical History:  Procedure Laterality Date   CARPAL TUNNEL RELEASE  05/08/2007   left   CARPAL TUNNEL RELEASE  12/12/2011   Procedure: CARPAL TUNNEL RELEASE;  Surgeon: Amelie Baize., MD;  Location: [facility];  Service: Orthopedics;  Laterality: Right;   catarac removal right eye Right 05/2019   CERVICAL DISC SURGERY  2011   CESAREAN SECTION  10-94   BTL   CHOLECYSTECTOMY  07/07/2005   lap. chole.   ESOPHAGEAL DILATION  05/15/2003   LUMBAR DISC SURGERY  early 1990's   RIGHT HEART CATH AND CORONARY ANGIOGRAPHY N/A 11/26/2023   Procedure: RIGHT HEART CATH AND CORONARY ANGIOGRAPHY;  Surgeon: Cody Das, MD;  Location: [facility];  Service: Cardiovascular;  Laterality: N/A;   SPINE SURGERY     TONSILLECTOMY AND ADENOIDECTOMY  as a child   TRIGGER FINGER RELEASE Right 02/12/2014   Procedure: RELEASE TRIGGER FINGER/A-1 PULLEY RIGHT INDEX AND RIGHT LONG FINGERS;  Surgeon: Amelie Baize, MD;  Location: [facility];  Service: Orthopedics;  Laterality: Right;   TUBAL LIGATION  1994   WISDOM TOOTH EXTRACTION      Family History  Problem Relation Age of Onset   Colon cancer Mother    Obesity Mother    Diabetes Mother    Deep vein thrombosis Mother    Hypertension Mother        pacemaker   Emphysema Mother    Thyroid disease Mother    Allergic rhinitis Mother    Asthma Mother    Hypertension Father        pacemaker   Alzheimer's disease Father    Hypothyroidism Father    Allergic rhinitis Father    Diabetes  Sister    Obesity Sister    Hypertension Sister    COPD Sister    Hypothyroidism Sister    Thyroid disease Sister    Fibromyalgia Sister    Bipolar disorder Sister    Hypothyroidism Sister    Suicidality Sister    Allergic rhinitis Brother    Angioedema Neg Hx    Atopy Neg Hx    Eczema Neg Hx    Immunodeficiency Neg Hx    Urticaria Neg Hx     Social History   Socioeconomic History   Marital status: Married    Spouse name: Not on file   Number of children: 2   Years of education: Not on file   Highest education level: Not on file  Occupational History   Occupation: HAIRDRESSER    Employer: [employer]  Tobacco Use   Smoking status: Former    Current packs/day: 0.00    Average packs/day: 1 pack/day for 13.0 years (13.0 ttl pk-yrs)    Types: Cigarettes    Start date: 02/11/1992    Quit date: 02/10/2005    Years since quitting: 18.8   Smokeless tobacco: Never  Vaping Use   Vaping status: Never Used  Substance and Sexual Activity   Alcohol use: No   Drug use: No   Sexual activity: Yes  Other Topics Concern   Not on file  Social History Narrative   Not on file   Social Drivers of Health   Financial Resource Strain: Not on file  Food Insecurity: Not on file  Transportation Needs: Not on file  Physical Activity: Not on file  Stress: Not on file  Social Connections: Not on file  Intimate Partner Violence: Not on file     Prior to Admission medications   Medication Sig Start Date End Date Taking? Authorizing Provider  albuterol  (VENTOLIN  HFA) 108 (90 Base) MCG/ACT inhaler TAKE 2 PUFFS BY MOUTH EVERY 4 HOURS AS NEEDED 07/17/23   Kozlow, Eric J, MD  Ascorbic Acid (VITAMIN C PO) Take 1 tablet by mouth in the morning.    [provider]  aspirin  EC 81 MG tablet Take 1 tablet (81 mg total) by mouth daily. Swallow whole. Patient taking differently: Take 81 mg by mouth every evening. Swallow whole. 10/12/23   Patwardhan, Kaye Parsons, MD  carvedilol  (COREG ) 3.125 MG  tablet Take 1 tablet (3.125 mg total) by mouth 2 (two) times daily with a meal. 10/12/23   Patwardhan, Manish J, MD  cholecalciferol (VITAMIN D) 1000 UNITS tablet Take 1,000 Units by mouth in the morning.    [provider]  desloratadine  (CLARINEX ) 5 MG tablet TAKE 1 TABLET BY MOUTH DAILY AS NEEDED Patient taking differently: Take 5 mg by mouth in the morning. 07/06/23   Kozlow, Rema Care, MD  esomeprazole  (NEXIUM ) 40 MG capsule  TAKE ONE CAPSULE BY MOUTH EVERY MORNING 05/24/23   Kozlow, Rema Care, MD  fluocinonide cream (LIDEX) 0.05 % Apply 1 Application topically 2 (two) times daily as needed (skin irritation).    [provider]  fluticasone  (FLONASE ) 50 MCG/ACT nasal spray SPRAY 1-2 SPRAYS INTO EACH NOSTRIL 1-7 times per week. Patient taking differently: Place 1 spray into both nostrils daily as needed for allergies. SPRAY 1-2 SPRAYS INTO EACH NOSTRIL 1-7 times per week. 07/17/23   Kozlow, Rema Care, MD  Fluticasone  Furoate (ARNUITY ELLIPTA ) 200 MCG/ACT AEPB INHALE 1 PUFF BY MOUTH DAILY 06/04/23   Kozlow, Eric J, MD  Golimumab (SIMPONI ARIA IV) Inject 1 Dose into the vein.    [provider]  nitroGLYCERIN  (NITROSTAT ) 0.4 MG SL tablet Place 1 tablet (0.4 mg total) under the tongue every 5 (five) minutes as needed for chest pain. 10/12/23   Patwardhan, Kaye Parsons, MD  olmesartan-hydrochlorothiazide (BENICAR HCT) 40-25 MG tablet Take 1 tablet by mouth in the morning.    [provider]  rosuvastatin  (CRESTOR ) 20 MG tablet Take 1 tablet (20 mg total) by mouth daily. 04/24/22   Patwardhan, Kaye Parsons, MD  sertraline (ZOLOFT) 100 MG tablet Take 50 mg by mouth at bedtime. Take one half daily    [provider]  triamcinolone cream (KENALOG) 0.1 % Apply 1 Application topically 2 (two) times daily as needed (skin irritation). 12/16/18   [provider]    Allergies  Allergen Reactions   Penicillins Hives, Swelling and Rash   Remicade [Infliximab] Other (See  Comments)    malaise/headache after infusion   Poison Ivy Extract Hives and Rash    REVIEW OF SYSTEMS:  General: no fevers/chills/night sweats Eyes: no blurry vision, diplopia, or amaurosis ENT: no sore throat or hearing loss Resp: no cough, wheezing, or hemoptysis CV: no edema or palpitations GI: no abdominal pain, nausea, vomiting, diarrhea, or constipation GU: no dysuria, frequency, or hematuria Skin: no rash Neuro: no headache, numbness, tingling, or weakness of extremities Musculoskeletal: no joint pain or swelling Heme: no bleeding, DVT, or easy bruising Endo: no polydipsia or polyuria  BP 118/80   Pulse 84   Ht 5' 0.5" (1.537 m)   Wt 161 lb (73 kg)   SpO2 95%   BMI 30.93 kg/m   PHYSICAL EXAM: GEN:  AO x 3 in no acute distress HEENT: normal Dentition: Normal Neck: JVP normal. +2 carotid upstrokes without bruits. No thyromegaly. Lungs: equal expansion, clear bilaterally CV: Apex is discrete and nondisplaced, RRR with 3/6 SEM Abd: soft, non-tender, non-distended; no bruit; positive bowel sounds Ext: no edema, ecchymoses, or cyanosis Vascular: 2+ femoral pulses, 2+ radial pulses       Skin: warm and dry without rash Neuro: CN II-XII grossly intact; motor and sensory grossly intact    DATA AND STUDIES:  EKG: April 2025 sinus bradycardia without bundle-branch blocks  EKG Interpretation Date/Time:    Ventricular Rate:    PR Interval:    QRS Duration:    QT Interval:    QTC Calculation:   R Axis:      Text Interpretation:          Cardiac Studies & Procedures   ______________________________________________________________________________________________ CARDIAC CATHETERIZATION  CARDIAC CATHETERIZATION 11/26/2023  Conclusion Images from the original result were not included.    Prox RCA lesion is 50% stenosed.   Recommend Aspirin  81mg  daily for moderate CAD.   Referral to structural valve clinic  Coronary angiography 11/26/2023: LM: Normal LAD:  No  significant disease Lcx: No significant disease RCA: Large, donnan vessel Prox 50% calcific stenosis  Right heart catheterization 11/26/2023: RA: 7 mmHg RV: 38/6 mmHg PA: 33/10 mmHg, mPAP 19 mmHg PCW: 9 mmHg  AO sats: 96% PA sats: 75%  CO: 5.7 L/min CI: 3.3 L/min/m2     Moderate nonobstructive CAD Continue workup and management for severe aortic stenosis  Cody Das, MD  Findings Coronary Findings Diagnostic  Dominance: Right  Right Coronary Artery Prox RCA lesion is 50% stenosed. The lesion is moderately calcified.  Intervention  No interventions have been documented.     ECHOCARDIOGRAM  ECHOCARDIOGRAM COMPLETE 11/02/2023  Narrative ECHOCARDIOGRAM REPORT    Patient Name:   Taylor Stanley Date of Exam: 11/02/2023 Medical Rec #:  [id number]        Height:       60.0 in Accession #:    [id number]       Weight:       161.0 lb Date of Birth:  01-21-1956         BSA:          1.702 m Patient Age:    68 years         BP:           148/76 mmHg Patient Gender: F                HR:           61 bpm. Exam Location:  [address]  Procedure: 2D Echo, Cardiac Doppler, Color Doppler and Strain Analysis (Both Spectral and Color Flow Doppler were utilized during procedure).  Indications:    I35.0 AS  History:        Patient has prior history of Echocardiogram examinations, most recent 11/27/2022. CAD, Aortic Valve Disease, Signs/Symptoms:3/6 Harsh midsystolic murmur at RUSB radiating to the neck; Risk Factors:Dyslipidemia and Hypertension. Previous echo revealed LVEF 64% mild to moderate AS with mean gradient 12.3 mmHg, mild MR and trace TR.  Sonographer:    Donnita Gales [facility], RDCS Referring Phys: [id number] [facility] J PATWARDHAN  IMPRESSIONS   1. Left ventricular ejection fraction, by estimation, is 70 to 75%. The left ventricle has hyperdynamic function. The left ventricle has no regional wall motion abnormalities. Left ventricular diastolic parameters  are consistent with Grade I diastolic dysfunction (impaired relaxation). The average left ventricular global longitudinal strain is -19.4 %. The global longitudinal strain is normal. 2. Right ventricular systolic function is normal. The right ventricular size is normal. 3. The mitral valve is normal in structure. Mild mitral valve regurgitation. No evidence of mitral stenosis. 4. DVI: 0.24. The aortic valve is abnormal. There is severe calcifcation of the aortic valve. There is severe thickening of the aortic valve. Aortic valve regurgitation is mild. Severe aortic valve stenosis. Aortic valve area, by VTI measures 0.84 cm. Aortic valve mean gradient measures 33.0 mmHg. Aortic valve Vmax measures 3.98 m/s. 5. The inferior vena cava is normal in size with greater than 50% respiratory variability, suggesting right atrial pressure of 3 mmHg.  FINDINGS Left Ventricle: Left ventricular ejection fraction, by estimation, is 70 to 75%. The left ventricle has hyperdynamic function. The left ventricle has no regional wall motion abnormalities. The average left ventricular global longitudinal strain is -19.4 %. Strain was performed and the global longitudinal strain is normal. The left ventricular internal cavity size was normal in size. There is no left ventricular hypertrophy. Left ventricular diastolic parameters are consistent with Grade I diastolic dysfunction (impaired relaxation).  Right Ventricle: The right ventricular size is normal. No increase in right ventricular wall thickness. Right ventricular systolic function is normal.  Left Atrium: Left atrial size was normal in size.  Right Atrium: Right atrial size was normal in size.  Pericardium: There is no evidence of pericardial effusion.  Mitral Valve: The mitral valve is normal in structure. Mild mitral valve regurgitation. No evidence of mitral valve stenosis.  Tricuspid Valve: The tricuspid valve is normal in structure. Tricuspid valve  regurgitation is mild . No evidence of tricuspid stenosis.  Aortic Valve: DVI: 0.24. The aortic valve is abnormal. There is severe calcifcation of the aortic valve. There is severe thickening of the aortic valve. Aortic valve regurgitation is mild. Severe aortic stenosis is present. Aortic valve mean gradient measures 33.0 mmHg. Aortic valve peak gradient measures 63.4 mmHg. Aortic valve area, by VTI measures 0.84 cm.  Pulmonic Valve: The pulmonic valve was normal in structure. Pulmonic valve regurgitation is not visualized. No evidence of pulmonic stenosis.  Aorta: The aortic root is normal in size and structure.  Venous: The inferior vena cava is normal in size with greater than 50% respiratory variability, suggesting right atrial pressure of 3 mmHg.  IAS/Shunts: No atrial level shunt detected by color flow Doppler.   LEFT VENTRICLE PLAX 2D LVIDd:         4.20 cm   Diastology LVIDs:         2.50 cm   LV e' medial:    6.47 cm/s LV PW:         1.00 cm   LV E/e' medial:  12.4 LV IVS:        1.00 cm   LV e' lateral:   7.29 cm/s LVOT diam:     2.00 cm   LV E/e' lateral: 11.0 LV SV:         74 LV SV Index:   43        2D Longitudinal Strain LVOT Area:     3.14 cm  2D Strain GLS (A4C):   -21.0 % 2D Strain GLS (A3C):   -20.1 % 2D Strain GLS (A2C):   -17.0 % 2D Strain GLS Avg:     -19.4 %  RIGHT VENTRICLE RV Basal diam:  3.50 cm RV Mid diam:    2.80 cm RV S prime:     23.90 cm/s TAPSE (M-mode): 2.6 cm RVSP:           30.5 mmHg  LEFT ATRIUM             Index        RIGHT ATRIUM           Index LA diam:        3.90 cm 2.29 cm/m   RA Pressure: 3.00 mmHg LA Vol (A2C):   34.7 ml 20.39 ml/m  RA Area:     14.90 cm LA Vol (A4C):   43.4 ml 25.50 ml/m  RA Volume:   38.50 ml  22.62 ml/m LA Biplane Vol: 38.9 ml 22.85 ml/m AORTIC VALVE AV Area (Vmax):    0.75 cm AV Area (Vmean):   0.92 cm AV Area (VTI):     0.84 cm AV Vmax:           398.00 cm/s AV Vmean:          227.000 cm/s AV  VTI:            0.871 m AV Peak Grad:      63.4  mmHg AV Mean Grad:      33.0 mmHg LVOT Vmax:         95.40 cm/s LVOT Vmean:        66.600 cm/s LVOT VTI:          0.234 m LVOT/AV VTI ratio: 0.27  AORTA Ao Root diam: 2.70 cm Ao Asc diam:  3.20 cm  MITRAL VALVE                TRICUSPID VALVE MV Area (PHT): 2.26 cm     TR Peak grad:   27.5 mmHg MV Decel Time: 335 msec     TR Vmax:        262.00 cm/s MV E velocity: 80.20 cm/s   Estimated RAP:  3.00 mmHg MV A velocity: 115.00 cm/s  RVSP:           30.5 mmHg MV E/A ratio:  0.70 SHUNTS Systemic VTI:  0.23 m Systemic Diam: 2.00 cm  Dorothye Gathers MD Electronically signed by Dorothye Gathers MD Signature Date/Time: 11/02/2023/4:04:17 PM    Final      CT SCANS  CT CORONARY FRACTIONAL FLOW RESERVE DATA PREP 03/07/2022  Narrative EXAM: CT FFR ANALYSIS  CLINICAL DATA:  chest pain  FINDINGS: FFRct analysis was performed on the original cardiac CT angiogram data set. Diagrammatic representation of the FFRct analysis is provided in a separate PDF document in PACS. This dictation was created using the PDF document and an interactive 3D model of the results. 3D model is not available in the EMR/PACS. Normal FFR range is >0.80. Indeterminate (grey) zone is 0.76-0.80.  1. Left Main: FFR = 0.99  2. LAD: Proximal FFR = 0.98, mid FFR = 0.95, distal FFR = 0.90 3. Ramus: Proximal FFR = 0.98, distal FFR = not modeled due to small caliber vessel. 4. LCX: Proximal FFR = 0.98, distal FFR = 0.96 5. RCA: Proximal FFR = 1.0, mid FFR =0.93, distal FFR = 0.93  IMPRESSION: 1.  CT FFR analysis showed no significant stenosis.  RECOMMENDATIONS: Goal directed medical therapy and aggressive risk factor modification for secondary prevention of coronary artery disease.   Electronically Signed By: Olinda Bertrand D.O. On: 03/22/2022 23:32   CT SCANS  CT CORONARY MORPH W/CTA COR W/SCORE 03/01/2022  Addendum 03/07/2022  1:05 PM ADDENDUM REPORT:  03/07/2022 13:03  HISTORY: ECG abnormal, intermediate CAD risk  EXAM: Cardiac/Coronary  CT  TECHNIQUE: The patient was scanned on a [employer].  PROTOCOL: A 120 kV prospective scan was triggered in the descending thoracic aorta at 111 HU's. Axial non-contrast 3 mm slices were carried out through the heart. The data set was analyzed on a dedicated work station and scored using the Agatson method. Gantry rotation speed was 250 msecs and collimation was .6 mm. No IV beta blockade but 0.8 mg of sl NTG was given. The 3D data set was reconstructed in 5% intervals of the 35-75 % of the R-R cycle. Diastolic phases were analyzed on a dedicated work station using MPR, MIP and VRT modes. The patient received 95mL OMNIPAQUE  IOHEXOL  350 MG/ML SOLN of contrast.  FINDINGS: Image quality: Average.  Artifact: Limited.  Coronary artery calcification score:  Left main: 0  Left anterior descending artery: 40.6  Left circumflex artery: 8.55  Right coronary artery: 273  Total coronary calcium  score of 322, places the patient at the 90th percentile for age and sex matched control.  Coronary arteries: Normal coronary origins.  Right dominance.  Left Main Coronary Artery:  The left main is a normal caliber vessel with a normal take off from the left coronary cusp that trifurcates into a LAD, LCX, and ramus intermedius. There is no plaque or stenosis.  Left Anterior Descending Coronary Artery: Normal caliber vessel, wraps the apex, gives off 1 patent diagonal branches.  Proximal segment minimal luminal irregularities due to both calcified and noncalcified plaque. Mild stenosis (25-49%) due to non-calcified plaque in mid LAD. Distal LAD patent. First diagonal branch is patent.  Ramus intermedius: Patent.  Left Circumflex Artery: Normal caliber vessel, non-dominant, overall patent.  Right Coronary Artery: Dominant vessel with normal take off from the right coronary cusp.  The RCA terminates as a PDA and right posterolateral branch. Diffuse disease within proximal to mid RCA due to mixed and calcified plaques. Multiple tandem lesions with either mild or moderate stenosis (accuracy may be reduced due to blooming artifact). Distal RCA, PDA and PLB are patent.  Left Atrium: Grossly normal in size with no left atrial appendage filling defect.  Left Ventricle: Grossly normal in size. There are no stigmata of prior infarction. There is no abnormal filling defect.  Pulmonary arteries: Normal in size without proximal filling defect.  Pulmonary veins: Normal pulmonary venous drainage.  Aorta: Normal size, 30 mm at the mid ascending aorta (level of the PA bifurcation) measured double oblique. Aortic atherosclerosis. No dissection.  Pericardium: Normal thickness with no significant effusion or calcium  present.  Cardiac valves: The aortic valve is trileaflet with aortic valve sclerosis. The mitral valve is normal structure without significant calcification.  Extra-cardiac findings: See attached radiology report for non-cardiac structures.  IMPRESSION: 1. Total coronary calcium  score of 322. This was 90th percentile for age and sex matched control.  2. Normal coronary origin with right dominance.  3. CAD-RADS = 3.  Left Main: Patent.  LAD: Mild stenosis (25-49%) due to noncalcified plaque at the mid segment.  Ramus: Patent.  LCX: Patent.  RCA: Mild to moderate stenosis due to diffuse mixed and calcified plaque within the proximal to mid segment (accuracy may be reduced due to blooming artifact).  4. Aortic atherosclerosis.  5. Study is sent for CT-FFR to further evaluate the RCA. Findings will be performed and reported separately.  RECOMMENDATIONS:  Consider symptom-guided anti-ischemic pharmacotherapy as well as risk factor modification per guideline directed care.   Electronically Signed By: Olinda Bertrand D.O. On: 03/07/2022  13:03  Narrative EXAM: OVER-READ INTERPRETATION  CT CHEST  The following report is a limited chest CT over-read performed by radiologist Dr. Elene Griffes of [facility] on 03/01/2022. This over-read does not include interpretation of cardiac or coronary anatomy or pathology. The coronary CTA interpretation by the cardiologist is attached.  COMPARISON:  Chest CT 01/12/2022  FINDINGS: Vascular: Atherosclerotic calcifications in the thoracic aorta with normal caliber.  Mediastinum/Nodes: Visualized mediastinal structures are normal.  Lungs/Pleura: Stable linear thickening along the right minor fissure on sequence 11 image 7 and minimally changed since 10/05/2017. No airspace disease or consolidation in the visualized lungs.  Upper Abdomen: Images of the upper abdomen are unremarkable.  Musculoskeletal: No acute bone abnormality.  IMPRESSION: No acute extracardiac findings.  Electronically Signed: By: Elene Griffes M.D. On: 03/02/2022 08:44     ______________________________________________________________________________________________      11/09/2023: ALT 46; BUN 14; Creatinine, Ser 0.72; Platelets 244 11/26/2023: Hemoglobin 11.6; Potassium 3.7; Sodium 142   STS RISK CALCULATOR: Pending  NHYA CLASS: 2    ASSESSMENT AND PLAN:   1. Nonrheumatic aortic valve stenosis  2. Coronary artery disease involving native coronary artery of native heart without angina pectoris   3. Hyperlipidemia LDL goal <70   4. Aortic atherosclerosis (HCC)   5. Essential hypertension   6. Rheumatoid arthritis, involving unspecified site, unspecified whether rheumatoid factor present (HCC)   7. CKD (chronic kidney disease) stage 2, GFR 60-89 ml/min     Aortic stenosis: The patient has developed severe symptomatic aortic stenosis with increasing fatigue and NYHA class II dyspnea.  Will refer for CTA and cardiothoracic surgical visit.  She is relatively active and would like to get  back to this level of functional capacity. Coronary artery disease: Continue aspirin  81 mg daily, Coreg  3.125 mg twice daily, rosuvastatin  20 mg daily, as needed nitroglycerin .  Cath demonstrates moderate disease without high-grade obstructions. Hyperlipidemia: Continue rosuvastatin  20 mg daily.  LDL last year was at goal 57. Aortic atherosclerosis: Continue rosuvastatin  20 mg, aspirin  81 mg daily, and strict blood pressure control. Hypertension: Continue Coreg  3.125 mg twice daily, olmesartan/hydrochlorothiazide 40 x 25 mg daily.  BP well-controlled today. Rheumatoid arthritis: Continue golimumab immunotherapy per rheumatology. CKD stage II: Continue olmesartan/hydrochlorothiazide 40 x 25 mg daily.   I have personally reviewed the patients imaging data as summarized above.  I have reviewed the natural history of aortic stenosis with the patient and family members who are present today. We have discussed the limitations of medical therapy and the poor prognosis associated with symptomatic aortic stenosis. We have also reviewed potential treatment options, including palliative medical therapy, conventional surgical aortic valve replacement, and transcatheter aortic valve replacement. We discussed treatment options in the context of this patient's specific comorbid medical conditions.   All of the patient's questions were answered today. Will make further recommendations based on the results of studies outlined above.   I spent 50 minutes reviewing all clinical data during and prior to this visit including all relevant imaging studies, laboratories, clinical information from other health systems and prior notes from both Cardiology and other specialties, interviewing the patient, conducting a complete physical examination, and coordinating care in order to formulate a comprehensive and personalized evaluation and treatment plan.   Caydan Mctavish K Tamiki Kuba, MD  11/30/2023 2:03 PM    [facility]  [facility] [address] [place], [place], [place]  [address] Phone: [phone]; Fax: [phone]    Belimumab immunotherapy.

## 2023-11-26 NOTE — Discharge Instructions (Addendum)

## 2023-11-30 ENCOUNTER — Encounter: Payer: Self-pay | Admitting: Internal Medicine

## 2023-11-30 ENCOUNTER — Ambulatory Visit: Attending: Internal Medicine | Admitting: Internal Medicine

## 2023-11-30 ENCOUNTER — Ambulatory Visit: Admitting: Nurse Practitioner

## 2023-11-30 ENCOUNTER — Other Ambulatory Visit: Payer: Self-pay

## 2023-11-30 VITALS — BP 118/80 | HR 84 | Ht 60.5 in | Wt 161.0 lb

## 2023-11-30 DIAGNOSIS — E785 Hyperlipidemia, unspecified: Secondary | ICD-10-CM | POA: Insufficient documentation

## 2023-11-30 DIAGNOSIS — I10 Essential (primary) hypertension: Secondary | ICD-10-CM | POA: Diagnosis present

## 2023-11-30 DIAGNOSIS — N182 Chronic kidney disease, stage 2 (mild): Secondary | ICD-10-CM | POA: Diagnosis present

## 2023-11-30 DIAGNOSIS — M069 Rheumatoid arthritis, unspecified: Secondary | ICD-10-CM | POA: Diagnosis present

## 2023-11-30 DIAGNOSIS — I700 Atherosclerosis of aorta: Secondary | ICD-10-CM | POA: Diagnosis present

## 2023-11-30 DIAGNOSIS — I2510 Atherosclerotic heart disease of native coronary artery without angina pectoris: Secondary | ICD-10-CM | POA: Insufficient documentation

## 2023-11-30 DIAGNOSIS — I350 Nonrheumatic aortic (valve) stenosis: Secondary | ICD-10-CM | POA: Diagnosis present

## 2023-11-30 NOTE — Patient Instructions (Addendum)
 Medication Instructions:  No changes today *If you need a refill on your cardiac medications before your next appointment, please call your pharmacy*  Lab Work: none  Testing/Procedures: CT scans as planned  Follow-Up: Per [employer] Team  Other Instructions

## 2023-11-30 NOTE — Progress Notes (Addendum)
 Pre Surgical Assessment: 5 M Walk Test  55M=16.63ft  5 Meter Walk Test- trial 1: 4.11 seconds 5 Meter Walk Test- trial 2: 4.47 seconds 5 Meter Walk Test- trial 3: 4.12 seconds 5 Meter Walk Test Average: 4.23 seconds  _________________________  Procedure Type: Isolated AVR Perioperative Outcome Estimate % Operative Mortality 1.99% Morbidity & Mortality 6.25% Stroke 0.786% Renal Failure 0.633% Reoperation 2.82% Prolonged Ventilation 2.92% Deep Sternal Wound Infection 0.055% Long Hospital Stay (>14 days) 2.42% Short Hospital Stay (<6 days)* 55.8%

## 2023-12-17 ENCOUNTER — Ambulatory Visit: Payer: Self-pay | Admitting: Internal Medicine

## 2023-12-17 ENCOUNTER — Ambulatory Visit (HOSPITAL_COMMUNITY)
Admission: RE | Admit: 2023-12-17 | Discharge: 2023-12-17 | Disposition: A | Discharge status: Home / Self Care | Source: Ambulatory Visit | Attending: Internal Medicine | Admitting: Internal Medicine

## 2023-12-17 DIAGNOSIS — I350 Nonrheumatic aortic (valve) stenosis: Secondary | ICD-10-CM | POA: Insufficient documentation

## 2023-12-17 MED ORDER — IOHEXOL 350 MG/ML SOLN
100.0000 mL | Freq: Once | INTRAVENOUS | Status: AC | PRN
  Administered 2023-12-17: 100 mL via INTRAVENOUS

## 2023-12-20 ENCOUNTER — Ambulatory Visit: Payer: Self-pay | Admitting: Cardiology

## 2024-01-16 ENCOUNTER — Other Ambulatory Visit: Payer: Self-pay | Admitting: Physician Assistant

## 2024-01-16 ENCOUNTER — Encounter: Payer: Self-pay | Admitting: Surgery

## 2024-01-16 ENCOUNTER — Encounter: Payer: Self-pay | Admitting: Physician Assistant

## 2024-01-16 ENCOUNTER — Ambulatory Visit: Attending: Surgery | Admitting: Surgery

## 2024-01-16 VITALS — BP 129/62 | HR 60 | Resp 20 | Ht 65.0 in | Wt 160.0 lb

## 2024-01-16 DIAGNOSIS — I350 Nonrheumatic aortic (valve) stenosis: Secondary | ICD-10-CM

## 2024-01-16 NOTE — Progress Notes (Unsigned)
 Patient ID: Taylor Stanley, female   DOB: 1955-11-20, 68 y.o.   MRN: [id number]   [facility]   MULTIDISCIPLINARY HEART VALVE CLINIC        CARDIOTHORACIC SURGERY CONSULTATION REPORT  PCP is Verlene Glimpse, NP Referring Provider is *** Primary Cardiologist is Cody Das, MD  Reason for consultation:  ***  HPI:  ***  Past Medical History:  Diagnosis Date   Anxiety    Asthma    triggered by allergies; does not use inhaler daily   Carpal tunnel syndrome of right wrist 11/2011   Dental crowns present    Depression    GERD (gastroesophageal reflux disease)    Hx of colonic polyps    Hypertension    under control; has been on med. x 5 yrs.   Osteopenia    PONV (postoperative nausea and vomiting)    Seasonal allergies    Seizure (HCC) as an infant   x 1 - unknown cause   Snores    denies apnea   TMJ disease    Wears glasses     Past Surgical History:  Procedure Laterality Date   CARPAL TUNNEL RELEASE  05/08/2007   left   CARPAL TUNNEL RELEASE  12/12/2011   Procedure: CARPAL TUNNEL RELEASE;  Surgeon: Amelie Baize., MD;  Location: [facility];  Service: Orthopedics;  Laterality: Right;   catarac removal right eye Right 05/2019   CERVICAL DISC SURGERY  2011   CESAREAN SECTION  10-94   BTL   CHOLECYSTECTOMY  07/07/2005   lap. chole.   ESOPHAGEAL DILATION  05/15/2003   LUMBAR DISC SURGERY  early 1990's   RIGHT HEART CATH AND CORONARY ANGIOGRAPHY N/A 11/26/2023   Procedure: RIGHT HEART CATH AND CORONARY ANGIOGRAPHY;  Surgeon: Cody Das, MD;  Location: [facility];  Service: Cardiovascular;  Laterality: N/A;   SPINE SURGERY     TONSILLECTOMY AND ADENOIDECTOMY  as a child   TRIGGER FINGER RELEASE Right 02/12/2014   Procedure: RELEASE TRIGGER FINGER/A-1 PULLEY RIGHT INDEX AND RIGHT LONG FINGERS;  Surgeon: Amelie Baize, MD;  Location: [facility];  Service: Orthopedics;  Laterality: Right;    TUBAL LIGATION  1994   WISDOM TOOTH EXTRACTION      Family History  Problem Relation Age of Onset   Colon cancer Mother    Obesity Mother    Diabetes Mother    Deep vein thrombosis Mother    Hypertension Mother        pacemaker   Emphysema Mother    Thyroid disease Mother    Allergic rhinitis Mother    Asthma Mother    Hypertension Father        pacemaker   Alzheimer's disease Father    Hypothyroidism Father    Allergic rhinitis Father    Diabetes Sister    Obesity Sister    Hypertension Sister    COPD Sister    Hypothyroidism Sister    Thyroid disease Sister    Fibromyalgia Sister    Bipolar disorder Sister    Hypothyroidism Sister    Suicidality Sister    Allergic rhinitis Brother    Angioedema Neg Hx    Atopy Neg Hx    Eczema Neg Hx    Immunodeficiency Neg Hx    Urticaria Neg Hx     Social History   Socioeconomic History   Marital status: Married    Spouse name: Not on file  Number of children: 2   Years of education: Not on file   Highest education level: Not on file  Occupational History   Occupation: HAIRDRESSER    Employer: [employer]  Tobacco Use   Smoking status: Former    Current packs/day: 0.00    Average packs/day: 1 pack/day for 13.0 years (13.0 ttl pk-yrs)    Types: Cigarettes    Start date: 02/11/1992    Quit date: 02/10/2005    Years since quitting: 18.9   Smokeless tobacco: Never  Vaping Use   Vaping status: Never Used  Substance and Sexual Activity   Alcohol use: No   Drug use: No   Sexual activity: Yes  Other Topics Concern   Not on file  Social History Narrative   Not on file   Social Drivers of Health   Financial Resource Strain: Not on file  Food Insecurity: Not on file  Transportation Needs: Not on file  Physical Activity: Not on file  Stress: Not on file  Social Connections: Not on file  Intimate Partner Violence: Not on file    Prior to Admission medications   Medication Sig Start Date End Date Taking?  Authorizing Provider  albuterol  (VENTOLIN  HFA) 108 (90 Base) MCG/ACT inhaler TAKE 2 PUFFS BY MOUTH EVERY 4 HOURS AS NEEDED 07/17/23   Kozlow, Eric J, MD  Ascorbic Acid (VITAMIN C PO) Take 1 tablet by mouth in the morning.    [provider]  aspirin  EC 81 MG tablet Take 1 tablet (81 mg total) by mouth daily. Swallow whole. Patient taking differently: Take 81 mg by mouth every evening. Swallow whole. 10/12/23   Patwardhan, Kaye Parsons, MD  carvedilol  (COREG ) 3.125 MG tablet Take 1 tablet (3.125 mg total) by mouth 2 (two) times daily with a meal. 10/12/23   Patwardhan, Manish J, MD  cholecalciferol (VITAMIN D) 1000 UNITS tablet Take 1,000 Units by mouth in the morning.    [provider]  desloratadine  (CLARINEX ) 5 MG tablet TAKE 1 TABLET BY MOUTH DAILY AS NEEDED Patient taking differently: Take 5 mg by mouth in the morning. 07/06/23   Kozlow, Rema Care, MD  esomeprazole  (NEXIUM ) 40 MG capsule TAKE ONE CAPSULE BY MOUTH EVERY MORNING 05/24/23   Kozlow, Eric J, MD  fluocinonide cream (LIDEX) 0.05 % Apply 1 Application topically 2 (two) times daily as needed (skin irritation).    [provider]  fluticasone  (FLONASE ) 50 MCG/ACT nasal spray SPRAY 1-2 SPRAYS INTO EACH NOSTRIL 1-7 times per week. Patient taking differently: Place 1 spray into both nostrils daily as needed for allergies. SPRAY 1-2 SPRAYS INTO EACH NOSTRIL 1-7 times per week. 07/17/23   Kozlow, Rema Care, MD  Fluticasone  Furoate (ARNUITY ELLIPTA ) 200 MCG/ACT AEPB INHALE 1 PUFF BY MOUTH DAILY 06/04/23   Kozlow, Eric J, MD  Golimumab (SIMPONI ARIA IV) Inject 1 Dose into the vein.    [provider]  nitroGLYCERIN  (NITROSTAT ) 0.4 MG SL tablet Place 1 tablet (0.4 mg total) under the tongue every 5 (five) minutes as needed for chest pain. 10/12/23   Patwardhan, Kaye Parsons, MD  olmesartan-hydrochlorothiazide (BENICAR HCT) 40-25 MG tablet Take 1 tablet by mouth in the morning.    [provider]  rosuvastatin  (CRESTOR )  20 MG tablet Take 1 tablet (20 mg total) by mouth daily. 04/24/22   Patwardhan, Kaye Parsons, MD  sertraline (ZOLOFT) 100 MG tablet Take 50 mg by mouth at bedtime. Take one half daily    [provider]  triamcinolone cream (  KENALOG) 0.1 % Apply 1 Application topically 2 (two) times daily as needed (skin irritation). 12/16/18   [provider]    Current Outpatient Medications  Medication Sig Dispense Refill   albuterol  (VENTOLIN  HFA) 108 (90 Base) MCG/ACT inhaler TAKE 2 PUFFS BY MOUTH EVERY 4 HOURS AS NEEDED 18 g 2   Ascorbic Acid (VITAMIN C PO) Take 1 tablet by mouth in the morning.     aspirin  EC 81 MG tablet Take 1 tablet (81 mg total) by mouth daily. Swallow whole. (Patient taking differently: Take 81 mg by mouth every evening. Swallow whole.) 90 tablet 2   carvedilol  (COREG ) 3.125 MG tablet Take 1 tablet (3.125 mg total) by mouth 2 (two) times daily with a meal. 180 tablet 2   cholecalciferol (VITAMIN D) 1000 UNITS tablet Take 1,000 Units by mouth in the morning.     desloratadine  (CLARINEX ) 5 MG tablet TAKE 1 TABLET BY MOUTH DAILY AS NEEDED (Patient taking differently: Take 5 mg by mouth in the morning.) 90 tablet 1   esomeprazole  (NEXIUM ) 40 MG capsule TAKE ONE CAPSULE BY MOUTH EVERY MORNING 90 capsule 2   fluocinonide cream (LIDEX) 0.05 % Apply 1 Application topically 2 (two) times daily as needed (skin irritation).     fluticasone  (FLONASE ) 50 MCG/ACT nasal spray SPRAY 1-2 SPRAYS INTO EACH NOSTRIL 1-7 times per week. (Patient taking differently: Place 1 spray into both nostrils daily as needed for allergies. SPRAY 1-2 SPRAYS INTO EACH NOSTRIL 1-7 times per week.) 16 mL 5   Fluticasone  Furoate (ARNUITY ELLIPTA ) 200 MCG/ACT AEPB INHALE 1 PUFF BY MOUTH DAILY 30 each 11   Golimumab (SIMPONI ARIA IV) Inject 1 Dose into the vein.     nitroGLYCERIN  (NITROSTAT ) 0.4 MG SL tablet Place 1 tablet (0.4 mg total) under the tongue every 5 (five) minutes as needed for chest pain. 25 tablet 4    olmesartan-hydrochlorothiazide (BENICAR HCT) 40-25 MG tablet Take 1 tablet by mouth in the morning.     rosuvastatin  (CRESTOR ) 20 MG tablet Take 1 tablet (20 mg total) by mouth daily. 90 tablet 2   sertraline (ZOLOFT) 100 MG tablet Take 50 mg by mouth at bedtime. Take one half daily     triamcinolone cream (KENALOG) 0.1 % Apply 1 Application topically 2 (two) times daily as needed (skin irritation).     No current facility-administered medications for this visit.    Allergies  Allergen Reactions   Penicillins Hives, Swelling and Rash   Remicade [Infliximab] Other (See Comments)    malaise/headache after infusion   Poison Ivy Extract Hives and Rash      Review of Systems:   General:  *** appetite, *** energy, *** weight gain, *** weight loss, *** fever  Cardiac:  *** chest pain with exertion, *** chest pain at rest, ***SOB with *** exertion, *** resting SOB, *** PND, *** orthopnea, *** palpitations, *** arrhythmia, *** atrial fibrillation, *** LE edema, *** dizzy spells, *** syncope  Respiratory:  *** shortness of breath, *** home oxygen, *** productive cough, *** dry cough, *** bronchitis, *** wheezing, *** hemoptysis, *** asthma, *** pain with inspiration or cough, *** sleep apnea, *** CPAP at night  GI:   *** difficulty swallowing, *** reflux, *** frequent heartburn, *** hiatal hernia, *** abdominal pain, *** constipation, *** diarrhea, *** hematochezia, *** hematemesis, *** melena  GU:   *** dysuria,  *** frequency, *** urinary tract infection, *** hematuria, *** enlarged prostate, *** kidney stones, *** kidney disease  Vascular:  ***  pain suggestive of claudication, *** pain in feet, *** leg cramps, *** varicose veins, *** DVT, *** non-healing foot ulcer  Neuro:   *** stroke, *** TIA's, *** seizures, *** headaches, *** temporary blindness one eye,  *** slurred speech, *** peripheral neuropathy, *** chronic pain, *** instability of gait, *** memory/cognitive  dysfunction  Musculoskeletal: *** arthritis - primarily involving the ***, *** joint swelling, *** myalgias, *** difficulty walking, *** mobility   Skin:   *** rash, *** itching, *** skin infections, *** pressure sores or ulcerations  Psych:   *** anxiety, *** depression, *** nervousness, *** unusual recent stress  Eyes:   *** blurry vision, *** floaters, *** recent vision changes, *** wears glasses or contacts  ENT:   *** hearing loss, *** loose or painful teeth, *** dentures, last saw dentist ***  Hematologic:  *** easy bruising, *** abnormal bleeding, *** clotting disorder, *** frequent epistaxis  Endocrine:  *** diabetes, does not check CBG's at home***     Physical Exam:   There were no vitals taken for this visit.  General:  ***  well-appearing  HEENT:  Unremarkable ***  Neck:   no JVD, no bruits, no adenopathy ***  Chest:   clear to auscultation, symmetrical breath sounds, no wheezes, no rhonchi ***  CV:   RRR, no *** murmur   Abdomen:  soft, non-tender, no masses ***  Extremities:  warm, well-perfused, pulses ***, *** lower extremity edema  Rectal/GU  Deferred  Neuro:   Grossly non-focal and symmetrical throughout  Skin:   Clean and dry, no rashes, no breakdown  Diagnostic Tests:    Impression:  ***  Plan:  ***   Bartley Lightning, MD 01/16/2024

## 2024-01-25 ENCOUNTER — Ambulatory Visit (HOSPITAL_COMMUNITY)
Admission: RE | Admit: 2024-01-25 | Discharge: 2024-01-25 | Disposition: A | Discharge status: Home / Self Care | Source: Ambulatory Visit | Attending: Physician Assistant | Admitting: Physician Assistant

## 2024-01-25 ENCOUNTER — Encounter (HOSPITAL_COMMUNITY)
Admission: RE | Admit: 2024-01-25 | Discharge: 2024-01-25 | Disposition: A | Discharge status: Home / Self Care | Source: Ambulatory Visit | Attending: Cardiovascular Disease

## 2024-01-25 ENCOUNTER — Other Ambulatory Visit: Payer: Self-pay

## 2024-01-25 DIAGNOSIS — I350 Nonrheumatic aortic (valve) stenosis: Secondary | ICD-10-CM | POA: Insufficient documentation

## 2024-01-25 DIAGNOSIS — I517 Cardiomegaly: Secondary | ICD-10-CM | POA: Insufficient documentation

## 2024-01-25 DIAGNOSIS — Z01818 Encounter for other preprocedural examination: Secondary | ICD-10-CM | POA: Insufficient documentation

## 2024-01-25 DIAGNOSIS — R001 Bradycardia, unspecified: Secondary | ICD-10-CM | POA: Insufficient documentation

## 2024-01-25 LAB — CBC
HCT: 39 % (ref 36.0–46.0)
Hemoglobin: 13.3 g/dL (ref 12.0–15.0)
MCH: 32.4 pg (ref 26.0–34.0)
MCHC: 34.1 g/dL (ref 30.0–36.0)
MCV: 95.1 fL (ref 80.0–100.0)
Platelets: 195 10*3/uL (ref 150–400)
RBC: 4.1 MIL/uL (ref 3.87–5.11)
RDW: 12.3 % (ref 11.5–15.5)
WBC: 7.6 10*3/uL (ref 4.0–10.5)
nRBC: 0 % (ref 0.0–0.2)

## 2024-01-25 LAB — COMPREHENSIVE METABOLIC PANEL WITH GFR
ALT: 41 U/L (ref 0–44)
AST: 36 U/L (ref 15–41)
Albumin: 4.2 g/dL (ref 3.5–5.0)
Alkaline Phosphatase: 73 U/L (ref 38–126)
Anion gap: 7 (ref 5–15)
BUN: 14 mg/dL (ref 8–23)
CO2: 31 mmol/L (ref 22–32)
Calcium: 9.6 mg/dL (ref 8.9–10.3)
Chloride: 100 mmol/L (ref 98–111)
Creatinine, Ser: 0.71 mg/dL (ref 0.44–1.00)
GFR, Estimated: >60 mL/min (ref >60)
Glucose, Bld: 108 mg/dL — ABNORMAL HIGH (ref 70–99)
Potassium: 4 mmol/L (ref 3.5–5.1)
Sodium: 138 mmol/L (ref 135–145)
Total Bilirubin: 0.9 mg/dL (ref 0.0–1.2)
Total Protein: 7.1 g/dL (ref 6.5–8.1)

## 2024-01-25 LAB — URINALYSIS, ROUTINE W REFLEX MICROSCOPIC
Bilirubin Urine: NEGATIVE
Glucose, UA: NEGATIVE mg/dL
Hgb urine dipstick: NEGATIVE
Ketones, ur: NEGATIVE mg/dL
Nitrite: NEGATIVE
Protein, ur: NEGATIVE mg/dL
Specific Gravity, Urine: 1.016 (ref 1.005–1.030)
pH: 6 (ref 5.0–8.0)

## 2024-01-25 LAB — TYPE AND SCREEN
ABO/RH(D): O POS
Antibody Screen: NEGATIVE

## 2024-01-25 LAB — SURGICAL PCR SCREEN
MRSA, PCR: NEGATIVE
Staphylococcus aureus: NEGATIVE

## 2024-01-25 LAB — PROTIME-INR
INR: 1 (ref 0.8–1.2)
Prothrombin Time: 13.7 s (ref 11.4–15.2)

## 2024-01-25 NOTE — Progress Notes (Signed)
 Patient signed all consents at PAT lab appointment. CHG soap and instructions were given to patient. CHG surgical prep reviewed with patient and all questions answered.  Patients chart send to anesthesia for review. Pt denies any respiratory illness/infection in the last two months.

## 2024-01-28 MED ORDER — NOREPINEPHRINE 4 MG/250ML-% IV SOLN
0.0000 ug/min | INTRAVENOUS | Status: AC
  Administered 2024-01-29: 2 ug/min via INTRAVENOUS
  Filled 2024-01-28: qty 250

## 2024-01-28 MED ORDER — MAGNESIUM SULFATE 50 % IJ SOLN
40.0000 meq | INTRAMUSCULAR | Status: DC
  Filled 2024-01-28 (×2): qty 9.85

## 2024-01-28 MED ORDER — POTASSIUM CHLORIDE 2 MEQ/ML IV SOLN
80.0000 meq | INTRAVENOUS | Status: DC
  Filled 2024-01-28 (×2): qty 40

## 2024-01-28 MED ORDER — VANCOMYCIN HCL IN DEXTROSE 1-5 GM/200ML-% IV SOLN
1000.0000 mg | INTRAVENOUS | Status: AC
  Administered 2024-01-29: 1000 mg via INTRAVENOUS
  Filled 2024-01-28: qty 200

## 2024-01-28 MED ORDER — HEPARIN 30,000 UNITS/1000 ML (OHS) CELLSAVER SOLUTION
Status: DC
  Filled 2024-01-28 (×2): qty 1000

## 2024-01-28 MED ORDER — DEXMEDETOMIDINE HCL IN NACL 400 MCG/100ML IV SOLN
0.1000 ug/kg/h | INTRAVENOUS | Status: AC
  Administered 2024-01-29: 1 ug/kg/h via INTRAVENOUS
  Filled 2024-01-28: qty 100

## 2024-01-28 NOTE — H&P (Signed)
 [facility]   MULTIDISCIPLINARY HEART VALVE CLINIC         CARDIOTHORACIC SURGERY ADMISSION HISTORY AND PHYSICAL   PCP is [name], NP Referring Provider is Lurena Red, MD Primary Cardiologist is Newman JINNY Lawrence, MD   Reason for admission:  Severe aortic stenosis   HPI:   The patient is a 68 year old woman with a history of hypertension, hyperlipidemia, stage II chronic kidney disease, rheumatoid arthritis on golimumab, mild to moderate nonobstructive CAD and severe aortic stenosis who was referred for consideration of TAVR.  Her most recent echocardiogram on 11/02/2023 showed an aortic valve mean gradient of 33 mmHg with a peak of 63.4 mmHg.  Dimensionless index was 0.24 with a valve area of 0.84 cm by VTI.  There was mild aortic insufficiency.  Left ventricular ejection fraction was 70 to 75% with grade 1 diastolic dysfunction.   She lives with her husband.  She reports exertional fatigue and shortness of breath that has progressed over the past few months.  This occurs with activities in her house and yard that would have not caused the symptoms a few months ago.  She denies any dizziness or syncope.  She has had no orthopnea or PND.  She denies peripheral edema.       Past Medical History:  Diagnosis Date   Anxiety     Asthma      triggered by allergies; does not use inhaler daily   Carpal tunnel syndrome of right wrist 11/2011   Dental crowns present     Depression     GERD (gastroesophageal reflux disease)     Hx of colonic polyps     Hypertension      under control; has been on med. x 5 yrs.   Osteopenia     PONV (postoperative nausea and vomiting)     Seasonal allergies     Seizure (HCC) as an infant    x 1 - unknown cause   Snores      denies apnea   TMJ disease     Wears glasses                 Past Surgical History:  Procedure Laterality Date   CARPAL TUNNEL RELEASE   05/08/2007    left   CARPAL TUNNEL RELEASE   12/12/2011     Procedure: CARPAL TUNNEL RELEASE;  Surgeon: Lamar LULLA Leonor Mickey., MD;  Location: [facility];  Service: Orthopedics;  Laterality: Right;   catarac removal right eye Right 05/2019   CERVICAL DISC SURGERY   2011   CESAREAN SECTION   10-94    BTL   CHOLECYSTECTOMY   07/07/2005    lap. chole.   ESOPHAGEAL DILATION   05/15/2003   LUMBAR DISC SURGERY   early 1990's   RIGHT HEART CATH AND CORONARY ANGIOGRAPHY N/A 11/26/2023    Procedure: RIGHT HEART CATH AND CORONARY ANGIOGRAPHY;  Surgeon: Lawrence Newman JINNY, MD;  Location: [facility];  Service: Cardiovascular;  Laterality: N/A;   SPINE SURGERY       TONSILLECTOMY AND ADENOIDECTOMY   as a child   TRIGGER FINGER RELEASE Right 02/12/2014    Procedure: RELEASE TRIGGER FINGER/A-1 PULLEY RIGHT INDEX AND RIGHT LONG FINGERS;  Surgeon: Lamar Leonor Mickey LULLA, MD;  Location: [facility];  Service: Orthopedics;  Laterality: Right;   TUBAL LIGATION   1994   WISDOM TOOTH EXTRACTION  Family History  Problem Relation Age of Onset   Colon cancer Mother     Obesity Mother     Diabetes Mother     Deep vein thrombosis Mother     Hypertension Mother          pacemaker   Emphysema Mother     Thyroid disease Mother     Allergic rhinitis Mother     Asthma Mother     Hypertension Father          pacemaker   Alzheimer's disease Father     Hypothyroidism Father     Allergic rhinitis Father     Diabetes Sister     Obesity Sister     Hypertension Sister     COPD Sister     Hypothyroidism Sister     Thyroid disease Sister     Fibromyalgia Sister     Bipolar disorder Sister     Hypothyroidism Sister     Suicidality Sister     Allergic rhinitis Brother     Angioedema Neg Hx     Atopy Neg Hx     Eczema Neg Hx     Immunodeficiency Neg Hx     Urticaria Neg Hx            Social History         Socioeconomic History   Marital status: Married      Spouse name: Not on file   Number of children:  2   Years of education: Not on file   Highest education level: Not on file  Occupational History   Occupation: HAIRDRESSER      Employer: [employer]  Tobacco Use   Smoking status: Former      Current packs/day: 0.00      Average packs/day: 1 pack/day for 13.0 years (13.0 ttl pk-yrs)      Types: Cigarettes      Start date: 02/11/1992      Quit date: 02/10/2005      Years since quitting: 18.9   Smokeless tobacco: Never  Vaping Use   Vaping status: Never Used  Substance and Sexual Activity   Alcohol use: No   Drug use: No   Sexual activity: Yes  Other Topics Concern   Not on file  Social History Narrative   Not on file    Social Drivers of Health    Financial Resource Strain: Not on file  Food Insecurity: Not on file  Transportation Needs: Not on file  Physical Activity: Not on file  Stress: Not on file  Social Connections: Not on file  Intimate Partner Violence: Not on file             Prior to Admission medications   Medication Sig Start Date End Date Taking? Authorizing Provider  albuterol  (VENTOLIN  HFA) 108 (90 Base) MCG/ACT inhaler TAKE 2 PUFFS BY MOUTH EVERY 4 HOURS AS NEEDED 07/17/23     Kozlow, Eric J, MD  Ascorbic Acid (VITAMIN C PO) Take 1 tablet by mouth in the morning.       [provider]  aspirin  EC 81 MG tablet Take 1 tablet (81 mg total) by mouth daily. Swallow whole. Patient taking differently: Take 81 mg by mouth every evening. Swallow whole. 10/12/23     Patwardhan, Newman PARAS, MD  carvedilol  (COREG ) 3.125 MG tablet Take 1 tablet (3.125 mg total) by mouth 2 (two) times daily with a meal. 10/12/23     Patwardhan, Newman PARAS, MD  cholecalciferol (VITAMIN D) 1000 UNITS tablet Take 1,000 Units by mouth in the morning.       [provider]  desloratadine  (CLARINEX ) 5 MG tablet TAKE 1 TABLET BY MOUTH DAILY AS NEEDED Patient taking differently: Take 5 mg by mouth in the morning. 07/06/23     Kozlow, Camellia PARAS, MD  esomeprazole  (NEXIUM ) 40 MG capsule  TAKE ONE CAPSULE BY MOUTH EVERY MORNING 05/24/23     Kozlow, Eric J, MD  fluocinonide cream (LIDEX) 0.05 % Apply 1 Application topically 2 (two) times daily as needed (skin irritation).       [provider]  fluticasone  (FLONASE ) 50 MCG/ACT nasal spray SPRAY 1-2 SPRAYS INTO EACH NOSTRIL 1-7 times per week. Patient taking differently: Place 1 spray into both nostrils daily as needed for allergies. SPRAY 1-2 SPRAYS INTO EACH NOSTRIL 1-7 times per week. 07/17/23     Kozlow, Camellia PARAS, MD  Fluticasone  Furoate (ARNUITY ELLIPTA ) 200 MCG/ACT AEPB INHALE 1 PUFF BY MOUTH DAILY 06/04/23     Kozlow, Eric J, MD  Golimumab (SIMPONI ARIA IV) Inject 1 Dose into the vein.       [provider]  nitroGLYCERIN  (NITROSTAT ) 0.4 MG SL tablet Place 1 tablet (0.4 mg total) under the tongue every 5 (five) minutes as needed for chest pain. 10/12/23     Patwardhan, Newman PARAS, MD  olmesartan-hydrochlorothiazide (BENICAR HCT) 40-25 MG tablet Take 1 tablet by mouth in the morning.       [provider]  rosuvastatin  (CRESTOR ) 20 MG tablet Take 1 tablet (20 mg total) by mouth daily. 04/24/22     Patwardhan, Newman PARAS, MD  sertraline (ZOLOFT) 100 MG tablet Take 50 mg by mouth at bedtime. Take one half daily       [provider]  triamcinolone cream (KENALOG) 0.1 % Apply 1 Application topically 2 (two) times daily as needed (skin irritation). 12/16/18     [provider]            Current Outpatient Medications  Medication Sig Dispense Refill   albuterol  (VENTOLIN  HFA) 108 (90 Base) MCG/ACT inhaler TAKE 2 PUFFS BY MOUTH EVERY 4 HOURS AS NEEDED 18 g 2   Ascorbic Acid (VITAMIN C PO) Take 1 tablet by mouth in the morning.       aspirin  EC 81 MG tablet Take 1 tablet (81 mg total) by mouth daily. Swallow whole. (Patient taking differently: Take 81 mg by mouth every evening. Swallow whole.) 90 tablet 2   carvedilol  (COREG ) 3.125 MG tablet Take 1 tablet (3.125 mg total) by mouth 2 (two) times  daily with a meal. 180 tablet 2   cholecalciferol (VITAMIN D) 1000 UNITS tablet Take 1,000 Units by mouth in the morning.       desloratadine  (CLARINEX ) 5 MG tablet TAKE 1 TABLET BY MOUTH DAILY AS NEEDED (Patient taking differently: Take 5 mg by mouth in the morning.) 90 tablet 1   esomeprazole  (NEXIUM ) 40 MG capsule TAKE ONE CAPSULE BY MOUTH EVERY MORNING 90 capsule 2   fluocinonide cream (LIDEX) 0.05 % Apply 1 Application topically 2 (two) times daily as needed (skin irritation).       fluticasone  (FLONASE ) 50 MCG/ACT nasal spray SPRAY 1-2 SPRAYS INTO EACH NOSTRIL 1-7 times per week. (Patient taking differently: Place 1 spray into both nostrils daily as needed for allergies. SPRAY 1-2 SPRAYS INTO EACH NOSTRIL 1-7 times per week.) 16 mL 5   Fluticasone  Furoate (ARNUITY ELLIPTA ) 200 MCG/ACT AEPB INHALE 1 PUFF  BY MOUTH DAILY 30 each 11   Golimumab (SIMPONI ARIA IV) Inject 1 Dose into the vein.       nitroGLYCERIN  (NITROSTAT ) 0.4 MG SL tablet Place 1 tablet (0.4 mg total) under the tongue every 5 (five) minutes as needed for chest pain. 25 tablet 4   olmesartan-hydrochlorothiazide (BENICAR HCT) 40-25 MG tablet Take 1 tablet by mouth in the morning.       rosuvastatin  (CRESTOR ) 20 MG tablet Take 1 tablet (20 mg total) by mouth daily. 90 tablet 2   sertraline (ZOLOFT) 100 MG tablet Take 50 mg by mouth at bedtime. Take one half daily       triamcinolone cream (KENALOG) 0.1 % Apply 1 Application topically 2 (two) times daily as needed (skin irritation).          No current facility-administered medications for this visit.        Allergies       Allergies  Allergen Reactions   Penicillins Hives, Swelling and Rash   Remicade [Infliximab] Other (See Comments)      malaise/headache after infusion   Poison Ivy Extract Hives and Rash            Review of Systems:               General:                      normal appetite, + decreased energy, no weight gain, no weight loss, no fever              Cardiac:                       no chest pain with exertion, no chest pain at rest, + SOB with mild exertion, no resting SOB, no PND, no orthopnea, + palpitations, no arrhythmia, no atrial fibrillation, no LE edema, no dizzy spells, no syncope             Respiratory:                 + exertional shortness of breath, no home oxygen, no productive cough, no dry cough, no bronchitis, no wheezing, no hemoptysis, no asthma, no pain with inspiration or cough, no sleep apnea, no CPAP at night             GI:                               no difficulty swallowing, + reflux, no frequent heartburn, no hiatal hernia, no abdominal pain, no constipation, no diarrhea, no hematochezia, no hematemesis, no melena             GU:                              no dysuria,  no frequency, no urinary tract infection, no hematuria, no kidney stones, + mild chronic kidney disease             Vascular:                     no pain suggestive of claudication, no pain in feet, no leg cramps, no varicose veins, no DVT, no non-healing foot ulcer             Neuro:  no stroke, no TIA's, no seizures, + headaches, no temporary blindness one eye,  no slurred speech, no peripheral neuropathy, + chronic pain, no instability of gait, no memory/cognitive dysfunction             Musculoskeletal:         + rheumatoid arthritis, no joint swelling, no myalgias, no difficulty walking, normal mobility              Skin:                            no rash, no itching, no skin infections, no pressure sores or ulcerations             Psych:                         + anxiety, no depression, no nervousness, + unusual recent stress             Eyes:                           no blurry vision, no floaters, no recent vision changes, + wears glasses              ENT:                            no hearing loss, no loose or painful teeth, no dentures, last saw dentist this year             Hematologic:               no easy bruising, no  abnormal bleeding, no clotting disorder, no frequent epistaxis             Endocrine:                   no diabetes, does not check CBG's at home                            Physical Exam:               BP 129/62   Pulse 60   Resp 20   Ht 5' 5 (1.651 m)   Wt 160 lb (72.6 kg)   SpO2 93% Comment: RA  BMI 26.63 kg/m              General:                      well-appearing             HEENT:                       Unremarkable, NCAT, PERLA, EOMI             Neck:                           no JVD, no bruits, no adenopathy              Chest:                          clear to auscultation, symmetrical breath sounds, no wheezes, no rhonchi  CV:                              RRR, 3/6 systolic murmur RSB, no diastolic murmur             Abdomen:                    soft, non-tender, no masses              Extremities:                 warm, well-perfused, pedal pulses palpable, no lower extremity edema             Rectal/GU                   Deferred             Neuro:                         Grossly non-focal and symmetrical throughout             Skin:                            Clean and dry, no rashes, no breakdown   Diagnostic Tests:   ECHOCARDIOGRAM REPORT       Patient Name:   GEISHA ABERNATHY Jeffreys Date of Exam: 11/02/2023  Medical Rec #:  [id number]        Height:       60.0 in  Accession #:    [id number]       Weight:       161.0 lb  Date of Birth:  November 19, 1955         BSA:          1.702 m  Patient Age:    68 years         BP:           148/76 mmHg  Patient Gender: F                HR:           61 bpm.  Exam Location:  [address]   Procedure: 2D Echo, Cardiac Doppler, Color Doppler and Strain Analysis  (Both            Spectral and Color Flow Doppler were utilized during  procedure).   Indications:   I35.0 AS    History:        Patient has prior history of Echocardiogram examinations,  most                 recent 11/27/2022. CAD, Aortic Valve Disease,   Signs/Symptoms:3/6                 Harsh midsystolic murmur at RUSB radiating to the neck;  Risk                 Factors:Dyslipidemia and Hypertension. Previous echo  revealed                 LVEF 64% mild to moderate AS with mean gradient 12.3 mmHg,  mild                 MR and trace TR.    Sonographer:    Nolon Berg [facility], RDCS  Referring Phys: [id number] [facility]  IMPRESSIONS     1. Left ventricular ejection fraction, by estimation, is 70 to 75%. The  left ventricle has hyperdynamic function. The left ventricle has no  regional wall motion abnormalities. Left ventricular diastolic parameters  are consistent with Grade I diastolic  dysfunction (impaired relaxation). The average left ventricular global  longitudinal strain is -19.4 %. The global longitudinal strain is normal.   2. Right ventricular systolic function is normal. The right ventricular  size is normal.   3. The mitral valve is normal in structure. Mild mitral valve  regurgitation. No evidence of mitral stenosis.   4. DVI: 0.24. The aortic valve is abnormal. There is severe calcifcation  of the aortic valve. There is severe thickening of the aortic valve.  Aortic valve regurgitation is mild. Severe aortic valve stenosis. Aortic  valve area, by VTI measures 0.84 cm.  Aortic valve mean gradient measures 33.0 mmHg. Aortic valve Vmax measures  3.98 m/s.   5. The inferior vena cava is normal in size with greater than 50%  respiratory variability, suggesting right atrial pressure of 3 mmHg.   FINDINGS   Left Ventricle: Left ventricular ejection fraction, by estimation, is 70  to 75%. The left ventricle has hyperdynamic function. The left ventricle  has no regional wall motion abnormalities. The average left ventricular  global longitudinal strain is -19.4   %. Strain was performed and the global longitudinal strain is normal. The  left ventricular internal cavity size was normal in size. There is no left   ventricular hypertrophy. Left ventricular diastolic parameters are  consistent with Grade I diastolic  dysfunction (impaired relaxation).   Right Ventricle: The right ventricular size is normal. No increase in  right ventricular wall thickness. Right ventricular systolic function is  normal.   Left Atrium: Left atrial size was normal in size.   Right Atrium: Right atrial size was normal in size.   Pericardium: There is no evidence of pericardial effusion.   Mitral Valve: The mitral valve is normal in structure. Mild mitral valve  regurgitation. No evidence of mitral valve stenosis.   Tricuspid Valve: The tricuspid valve is normal in structure. Tricuspid  valve regurgitation is mild . No evidence of tricuspid stenosis.   Aortic Valve: DVI: 0.24. The aortic valve is abnormal. There is severe  calcifcation of the aortic valve. There is severe thickening of the aortic  valve. Aortic valve regurgitation is mild. Severe aortic stenosis is  present. Aortic valve mean gradient  measures 33.0 mmHg. Aortic valve peak gradient measures 63.4 mmHg. Aortic  valve area, by VTI measures 0.84 cm.   Pulmonic Valve: The pulmonic valve was normal in structure. Pulmonic valve  regurgitation is not visualized. No evidence of pulmonic stenosis.   Aorta: The aortic root is normal in size and structure.   Venous: The inferior vena cava is normal in size with greater than 50%  respiratory variability, suggesting right atrial pressure of 3 mmHg.   IAS/Shunts: No atrial level shunt detected by color flow Doppler.     LEFT VENTRICLE  PLAX 2D  LVIDd:         4.20 cm   Diastology  LVIDs:         2.50 cm   LV e' medial:    6.47 cm/s  LV PW:         1.00 cm   LV E/e' medial:  12.4  LV IVS:        1.00 cm   LV e' lateral:  7.29 cm/s  LVOT diam:     2.00 cm   LV E/e' lateral: 11.0  LV SV:         74  LV SV Index:   43        2D Longitudinal Strain  LVOT Area:     3.14 cm  2D Strain GLS (A4C):    -21.0 %                           2D Strain GLS (A3C):   -20.1 %                           2D Strain GLS (A2C):   -17.0 %                           2D Strain GLS Avg:     -19.4 %   RIGHT VENTRICLE  RV Basal diam:  3.50 cm  RV Mid diam:    2.80 cm  RV S prime:     23.90 cm/s  TAPSE (M-mode): 2.6 cm  RVSP:           30.5 mmHg   LEFT ATRIUM             Index        RIGHT ATRIUM           Index  LA diam:        3.90 cm 2.29 cm/m   RA Pressure: 3.00 mmHg  LA Vol (A2C):   34.7 ml 20.39 ml/m  RA Area:     14.90 cm  LA Vol (A4C):   43.4 ml 25.50 ml/m  RA Volume:   38.50 ml  22.62 ml/m  LA Biplane Vol: 38.9 ml 22.85 ml/m   AORTIC VALVE  AV Area (Vmax):    0.75 cm  AV Area (Vmean):   0.92 cm  AV Area (VTI):     0.84 cm  AV Vmax:           398.00 cm/s  AV Vmean:          227.000 cm/s  AV VTI:            0.871 m  AV Peak Grad:      63.4 mmHg  AV Mean Grad:      33.0 mmHg  LVOT Vmax:         95.40 cm/s  LVOT Vmean:        66.600 cm/s  LVOT VTI:          0.234 m  LVOT/AV VTI ratio: 0.27    AORTA  Ao Root diam: 2.70 cm  Ao Asc diam:  3.20 cm   MITRAL VALVE                TRICUSPID VALVE  MV Area (PHT): 2.26 cm     TR Peak grad:   27.5 mmHg  MV Decel Time: 335 msec     TR Vmax:        262.00 cm/s  MV E velocity: 80.20 cm/s   Estimated RAP:  3.00 mmHg  MV A velocity: 115.00 cm/s  RVSP:           30.5 mmHg  MV E/A ratio:  0.70  SHUNTS                              Systemic VTI:  0.23 m                              Systemic Diam: 2.00 cm   Oneil Parchment MD  Electronically signed by Oneil Parchment MD  Signature Date/Time: 11/02/2023/4:04:17 PM        Final      Physicians   Panel Physicians Referring Physician Case Authorizing Physician  Patwardhan, Newman PARAS, MD (Primary)        Procedures   RIGHT HEART CATH AND CORONARY ANGIOGRAPHY    Conclusion       Prox RCA lesion is 50% stenosed.   Recommend Aspirin  81mg  daily for moderate CAD.    Referral to structural valve clinic   Coronary angiography 11/26/2023: LM: Normal LAD: No significant disease Lcx: No significant disease RCA: Large, donnan vessel           Prox 50% calcific stenosis   Right heart catheterization 11/26/2023: RA: 7 mmHg RV: 38/6 mmHg PA: 33/10 mmHg, mPAP 19 mmHg PCW: 9 mmHg   AO sats: 96% PA sats: 75%   CO: 5.7 L/min CI: 3.3 L/min/m2        Moderate nonobstructive CAD Continue workup and management for severe aortic stenosis   Newman PARAS Lawrence, MD       Recommendations   Antiplatelet/Anticoag Recommend Aspirin  81mg  daily for moderate CAD. Referral to structural valve clinic    Indications   Nonrheumatic aortic valve stenosis [I35.0 (ICD-10-CM)]    Procedural Details   Technical Details Procedures: 1. Right heart catheterization 2. Selective left and right coronary angiography 3. Conscious sedation monitoring 26 min  Indication: Aortic stenosis  History: 68 y.o. female with hypertension, rheumatoid arthritis, elevated CAC, severe AS   Diagnostic Angiography: Catheter/s advances over guidewire under fluoroscopy 5 Fr TIG    Pressures tracings obtained in right atrium, right ventricle, pulmonary artery, and pulmonary capillary wedge position. Invasive hemodynamic measurements performed using Fick method.     Anticoagulation:  3500 units heparin   Hemostasis: TR band  Total contrast used: 25 cc   Total fluoro time: 2.7 min Air Kerma: 84.9 mGy  During this procedure the patient is administered a total of Versed  1 mg and Fentanyl  25 mcg to achieve and maintain moderate conscious sedation.  The patient's heart rate, blood pressure, and oxygen saturation are monitored continuously during the procedure. The period of conscious sedation is 26 minutes, of which I was present face-to-face 100% of this time. Damien Dimes, RT is an independent, trained observer who assisted in the monitoring of the patient's level of  consciousness.   Total sedation time: 26 minutes.  All wires and catheters removed out of the body at the end of the procedure Final angiogram showed no dissection/perforation.   Manish PARAS Lawrence, MD          Estimated blood loss <50 mL.   During this procedure medications were administered to achieve and maintain moderate conscious sedation while the patient's heart rate, blood pressure, and oxygen saturation were continuously monitored and I was present face-to-face 100% of this time. Damien Dimes Rad Tech and [place] are independent, trained observers who assisted in the monitoring of the patient's level of consciousness.    Medications (Filter: Administrations occurring from 1007  to 1055 on 11/26/23) fentaNYL  (SUBLIMAZE ) injection (mcg)  Total dose: 25 mcg Date/Time Rate/Dose/Volume Action    11/26/23 1017 25 mcg Given    midazolam  (VERSED ) injection (mg)  Total dose: 1 mg Date/Time Rate/Dose/Volume Action    11/26/23 1017 1 mg Given    Heparin  (Porcine) in NaCl 1000-0.9 UT/500ML-% SOLN (mL)  Total volume: 1,000 mL Date/Time Rate/Dose/Volume Action    11/26/23 1018 500 mL Given    1018 500 mL Given    lidocaine  (PF) (XYLOCAINE ) 1 % injection (mL)  Total volume: 2 mL Date/Time Rate/Dose/Volume Action    11/26/23 1026 2 mL Given    Radial Cocktail/Verapamil  only (mL)  Total volume: 10 mL Date/Time Rate/Dose/Volume Action    11/26/23 1029 10 mL Given    heparin  sodium (porcine) injection (Units)  Total dose: 3,500 Units Date/Time Rate/Dose/Volume Action    11/26/23 1039 3,500 Units Given    iohexol  (OMNIPAQUE ) 350 MG/ML injection (mL)  Total volume: 25 mL Date/Time Rate/Dose/Volume Action    11/26/23 1046 25 mL Given      Sedation Time   Sedation Time Physician-1: 26 minutes 45 seconds Contrast        Administrations occurring from 1007 to 1055 on 11/26/23:  Medication Name Total Dose  iohexol  (OMNIPAQUE ) 350 MG/ML injection 25 mL     Radiation/Fluoro   Fluoro time: 2.7 (min) DAP: 5.2 (Gycm2) Cumulative Air Kerma: 84.9 (mGy) Complications   Complications documented before study signed (11/26/2023 10:56 AM)    No complications were associated with this study.  Documented by Luella Richerd HERO, RCIS - 11/26/2023  9:51 AM      Coronary Findings   Diagnostic Dominance: Right Right Coronary Artery  Prox RCA lesion is 50% stenosed. The lesion is moderately calcified.    Intervention    No interventions have been documented.    Right Heart   Right Heart Pressures Right heart catheterization 11/26/2023: RA: 7 mmHg RV: 38/6 mmHg PA: 33/10 mmHg, mPAP 19 mmHg PCW: 9 mmHg  AO sats: 96% PA sats: 75%  CO: 5.7 L/min CI: 3.3 L/min/m2    Coronary Diagrams   Diagnostic Dominance: Right  Intervention    Implants    No implant documentation for this case.    Syng[employer] Images    Show images for CARDIAC CATHETERIZATION Images on Long Term Storage    Show images for Serafina, [place] to Procedure Log   Procedure Log    Hemo Data   Flowsheet Row Most Recent Value  Fick Cardiac Output 5.77 L/min  Fick Cardiac Output Index 3.37 (L/min)/BSA  RA A Wave 13 mmHg  RA V Wave 9 mmHg  RA Mean 7 mmHg  RV Systolic Pressure 38 mmHg  RV Diastolic Pressure 6 mmHg  RV EDP 13 mmHg  PA Systolic Pressure 33 mmHg  PA Diastolic Pressure 10 mmHg  PA Mean 19 mmHg  PW A Wave 21 mmHg  PW V Wave 15 mmHg  PW Mean 9 mmHg  AO Systolic Pressure 150 mmHg  AO Diastolic Pressure 89 mmHg  AO Mean 112 mmHg  QP/QS 1  TPVR Index 5.63 HRUI  TSVR Index 34.97 HRUI  PVR SVR Ratio 0.09  TPVR/TSVR Ratio 0.16      Narrative & Impression  CLINICAL DATA:  23F with severe aortic stenosis being evaluated for a TAVR procedure.   EXAM: Cardiac TAVR CT   TECHNIQUE: A non-contrast, gated CT scan was obtained with axial slices of 2.5 mm through the heart for aortic  valve scoring. A 120 kV retrospective, gated, contrast cardiac  scan was obtained. Gantry rotation speed was 230 msec and collimation was 0.63 mm. Nitroglycerin  was not given. A delayed scan was obtained to exclude left atrial appendage thrombus. The 3D dataset was reconstructed in systole with motion correction. The 3D data set was reconstructed in 5% intervals of the 0-95% of the R-R cycle. Systolic and diastolic phases were analyzed on a dedicated workstation using MPR, MIP, and VRT modes. The patient received 100 cc of contrast.   FINDINGS: Aortic Root:   Aortic valve: trileaflet   Aortic valve calcium  score: 493   Aortic annulus:   Diameter: 25mm x 21mm   Perimeter: 70mm   Area: 391mm^2   Calcifications: No calcifications   Coronary height: Min Left - 14mm; Min Right - 11mm   Sinotubular height: Left cusp - 19mm; Right cusp - 20mm; Noncoronary cusp - 20mm   LVOT (as measured 3 mm below the annulus):   Diameter: 26mm x 20mm   Area: 332mm^2   Calcifications: No calcifications   Aortic sinus width: Left cusp - 28mm; Right cusp - 27mm; Noncoronary cusp - 28mm   Sinotubular junction width: 27mm x 25mm   Optimum Fluoroscopic Angle for Delivery: LAO 27 CAU 4   Cardiac:   Right atrium: Normal size   Right ventricle: Normal size   Pulmonary arteries: Normal size   Pulmonary veins: Normal configuration   Left atrium: Normal size   Left ventricle: Normal size   Pericardium: Normal thickness   Coronary arteries: Coronary calcium  score 454 (91st percentile)   IMPRESSION: 1. Tricuspid aortic valve with mild to moderate calcifications (AV calcium  score 493)   2. Aortic annulus measures 25mm x 21mm in diameter with perimeter 70mm and area 331mm^2. No annular or LVOT calcifications. Annular measurements are suitable for delivery of 23mm Edwards Sapien 3 valve   3. Sufficient left main to annulus distance. Low coronary height to RCA (11mm) with small sinus of Valsalva diameter (28mm)   4. Optimum Fluoroscopic Angle  for Delivery: LAO 27 CAU 4   5. Coronary calcium  score 454 (91st percentile)   Electronically Signed: By: Lonni Nanas M.D. On: 12/17/2023 15:52          Impression:   This 67 year old woman has stage D, severe, symptomatic aortic stenosis with NYHA class II symptoms of exertional fatigue and shortness of breath consistent with chronic diastolic congestive heart.  I have reviewed her 2D echocardiogram, cardiac catheterization, and CTA studies.  Her echocardiogram shows a severely calcified aortic valve with restricted leaflet mobility.  The mean gradient was 33 mmHg with a valve area 0.84 cm and a dimensionless index of 0.24 consistent with severe aortic stenosis.  Cardiac catheterization showed moderate nonobstructive coronary disease with a 50% proximal RCA stenosis.  I agree that aortic valve replacement is indicated in this patient for relief of her symptoms and to prevent progressive left ventricular dysfunction.  Given her age and comorbidities I think that transcatheter aortic valve replacement would be the best option for her.  Her gated cardiac CTA shows anatomy suitable for TAVR 23 mm Edwards SAPIEN 3 valve.  Her abdominal and pelvic CTA shows adequate pelvic vascular anatomy to allow transfemoral insertion.   The patient and her husband were counseled at length regarding treatment alternatives for management of severe symptomatic aortic stenosis. The risks and benefits of surgical intervention has been discussed in detail. Long-term prognosis with medical therapy was discussed. Alternative approaches such as conventional  surgical aortic valve replacement, transcatheter aortic valve replacement, and palliative medical therapy were compared and contrasted at length. This discussion was placed in the context of the patient's own specific clinical presentation and past medical history. All of their questions have been addressed.    Following the decision to proceed with  transcatheter aortic valve replacement, a discussion was held regarding what types of management strategies would be attempted intraoperatively in the event of life-threatening complications, including whether or not the patient would be considered a candidate for the use of cardiopulmonary bypass and/or conversion to open sternotomy for attempted surgical intervention.  I think she would be a candidate for emergent sternotomy to manage any intraoperative complications.  The patient has been advised of a variety of complications that might develop including but not limited to risks of death, stroke, paravalvular leak, aortic dissection or other major vascular complications, aortic annulus rupture, device embolization, cardiac rupture or perforation, mitral regurgitation, acute myocardial infarction, arrhythmia, heart block or bradycardia requiring permanent pacemaker placement, congestive heart failure, respiratory failure, renal failure, pneumonia, infection, other late complications related to structural valve deterioration or migration, or other complications that might ultimately cause a temporary or permanent loss of functional independence or other long term morbidity. The patient provides full informed consent for the procedure as described and all questions were answered.     Plan:   Transfemoral TAVR using a SAPIEN 3 valve.      Dorise LOIS Fellers, MD

## 2024-01-29 ENCOUNTER — Inpatient Hospital Stay (HOSPITAL_COMMUNITY)
Admission: RE | Admit: 2024-01-29 | Discharge: 2024-01-30 | DRG: 267 | Disposition: A | Discharge status: Home / Self Care | Attending: Cardiovascular Disease | Admitting: Cardiovascular Disease

## 2024-01-29 ENCOUNTER — Encounter (HOSPITAL_COMMUNITY): Payer: Self-pay | Admitting: Cardiovascular Disease

## 2024-01-29 ENCOUNTER — Other Ambulatory Visit: Payer: Self-pay | Admitting: Physician Assistant

## 2024-01-29 ENCOUNTER — Inpatient Hospital Stay (HOSPITAL_COMMUNITY): Payer: Self-pay | Admitting: Certified Registered Nurse Anesthetist

## 2024-01-29 ENCOUNTER — Encounter (HOSPITAL_COMMUNITY)
Admission: RE | Disposition: A | Discharge status: Home / Self Care | Payer: Self-pay | Source: Home / Self Care | Attending: Cardiovascular Disease

## 2024-01-29 ENCOUNTER — Other Ambulatory Visit: Payer: Self-pay

## 2024-01-29 ENCOUNTER — Encounter (HOSPITAL_COMMUNITY): Payer: Self-pay | Admitting: Physician Assistant

## 2024-01-29 ENCOUNTER — Inpatient Hospital Stay (HOSPITAL_COMMUNITY)

## 2024-01-29 DIAGNOSIS — I350 Nonrheumatic aortic (valve) stenosis: Secondary | ICD-10-CM

## 2024-01-29 DIAGNOSIS — Z8249 Family history of ischemic heart disease and other diseases of the circulatory system: Secondary | ICD-10-CM | POA: Diagnosis not present

## 2024-01-29 DIAGNOSIS — Z7982 Long term (current) use of aspirin: Secondary | ICD-10-CM | POA: Diagnosis not present

## 2024-01-29 DIAGNOSIS — Z7951 Long term (current) use of inhaled steroids: Secondary | ICD-10-CM

## 2024-01-29 DIAGNOSIS — E785 Hyperlipidemia, unspecified: Secondary | ICD-10-CM | POA: Diagnosis present

## 2024-01-29 DIAGNOSIS — E669 Obesity, unspecified: Secondary | ICD-10-CM | POA: Diagnosis present

## 2024-01-29 DIAGNOSIS — Z888 Allergy status to other drugs, medicaments and biological substances status: Secondary | ICD-10-CM

## 2024-01-29 DIAGNOSIS — Z800 Family history of malignant neoplasm of digestive organs: Secondary | ICD-10-CM

## 2024-01-29 DIAGNOSIS — Z8349 Family history of other endocrine, nutritional and metabolic diseases: Secondary | ICD-10-CM

## 2024-01-29 DIAGNOSIS — M069 Rheumatoid arthritis, unspecified: Secondary | ICD-10-CM | POA: Diagnosis present

## 2024-01-29 DIAGNOSIS — K219 Gastro-esophageal reflux disease without esophagitis: Secondary | ICD-10-CM | POA: Diagnosis present

## 2024-01-29 DIAGNOSIS — I10 Essential (primary) hypertension: Secondary | ICD-10-CM | POA: Diagnosis not present

## 2024-01-29 DIAGNOSIS — Z825 Family history of asthma and other chronic lower respiratory diseases: Secondary | ICD-10-CM

## 2024-01-29 DIAGNOSIS — Z880 Allergy status to penicillin: Secondary | ICD-10-CM

## 2024-01-29 DIAGNOSIS — J45909 Unspecified asthma, uncomplicated: Secondary | ICD-10-CM | POA: Diagnosis present

## 2024-01-29 DIAGNOSIS — Z87891 Personal history of nicotine dependence: Secondary | ICD-10-CM

## 2024-01-29 DIAGNOSIS — N182 Chronic kidney disease, stage 2 (mild): Secondary | ICD-10-CM | POA: Diagnosis present

## 2024-01-29 DIAGNOSIS — I2510 Atherosclerotic heart disease of native coronary artery without angina pectoris: Secondary | ICD-10-CM

## 2024-01-29 DIAGNOSIS — Z006 Encounter for examination for normal comparison and control in clinical research program: Secondary | ICD-10-CM

## 2024-01-29 DIAGNOSIS — F32A Depression, unspecified: Secondary | ICD-10-CM | POA: Diagnosis present

## 2024-01-29 DIAGNOSIS — M8580 Other specified disorders of bone density and structure, unspecified site: Secondary | ICD-10-CM | POA: Diagnosis present

## 2024-01-29 DIAGNOSIS — Z6831 Body mass index (BMI) 31.0-31.9, adult: Secondary | ICD-10-CM

## 2024-01-29 DIAGNOSIS — Z952 Presence of prosthetic heart valve: Secondary | ICD-10-CM | POA: Diagnosis not present

## 2024-01-29 DIAGNOSIS — Z820 Family history of epilepsy and other diseases of the nervous system: Secondary | ICD-10-CM

## 2024-01-29 DIAGNOSIS — Z79899 Other long term (current) drug therapy: Secondary | ICD-10-CM | POA: Diagnosis not present

## 2024-01-29 DIAGNOSIS — I129 Hypertensive chronic kidney disease with stage 1 through stage 4 chronic kidney disease, or unspecified chronic kidney disease: Secondary | ICD-10-CM | POA: Diagnosis present

## 2024-01-29 DIAGNOSIS — Z860100 Personal history of colon polyps, unspecified: Secondary | ICD-10-CM

## 2024-01-29 DIAGNOSIS — Z818 Family history of other mental and behavioral disorders: Secondary | ICD-10-CM

## 2024-01-29 DIAGNOSIS — Z91048 Other nonmedicinal substance allergy status: Secondary | ICD-10-CM | POA: Diagnosis not present

## 2024-01-29 DIAGNOSIS — Z8269 Family history of other diseases of the musculoskeletal system and connective tissue: Secondary | ICD-10-CM

## 2024-01-29 DIAGNOSIS — Z79620 Long term (current) use of immunosuppressive biologic: Secondary | ICD-10-CM

## 2024-01-29 DIAGNOSIS — I351 Nonrheumatic aortic (valve) insufficiency: Secondary | ICD-10-CM | POA: Diagnosis present

## 2024-01-29 DIAGNOSIS — Z833 Family history of diabetes mellitus: Secondary | ICD-10-CM | POA: Diagnosis not present

## 2024-01-29 DIAGNOSIS — Z832 Family history of diseases of the blood and blood-forming organs and certain disorders involving the immune mechanism: Secondary | ICD-10-CM

## 2024-01-29 HISTORY — PX: INTRAOPERATIVE TRANSTHORACIC ECHOCARDIOGRAM: SHX6523

## 2024-01-29 LAB — POCT I-STAT, CHEM 8
BUN: 17 mg/dL (ref 8–23)
Calcium, Ion: 1.19 mmol/L (ref 1.15–1.40)
Chloride: 101 mmol/L (ref 98–111)
Creatinine, Ser: 0.7 mg/dL (ref 0.44–1.00)
Glucose, Bld: 187 mg/dL — ABNORMAL HIGH (ref 70–99)
HCT: 32 % — ABNORMAL LOW (ref 36.0–46.0)
Hemoglobin: 10.9 g/dL — ABNORMAL LOW (ref 12.0–15.0)
Potassium: 3.9 mmol/L (ref 3.5–5.1)
Sodium: 138 mmol/L (ref 135–145)
TCO2: 26 mmol/L (ref 22–32)

## 2024-01-29 LAB — ABO/RH: ABO/RH(D): O POS

## 2024-01-29 LAB — ECHOCARDIOGRAM LIMITED
AR max vel: 1.78 cm2
AV Area VTI: 1.91 cm2
AV Area mean vel: 1.3 cm2
AV Mean grad: 3.9 mmHg
AV Peak grad: 8.3 mmHg
Ao pk vel: 1.44 m/s
P 1/2 time: 1097 ms

## 2024-01-29 MED ORDER — ONDANSETRON HCL 4 MG/2ML IJ SOLN: 4.0000 mg | Freq: Four times a day (QID) | INTRAMUSCULAR | Status: DC | PRN

## 2024-01-29 MED ORDER — CHLORHEXIDINE GLUCONATE 4 % EX SOLN
30.0000 mL | CUTANEOUS | Status: DC
  Filled 2024-01-29: qty 30

## 2024-01-29 MED ORDER — CEFAZOLIN SODIUM-DEXTROSE 2-4 GM/100ML-% IV SOLN: 2.0000 g | Freq: Three times a day (TID) | INTRAVENOUS | Status: DC

## 2024-01-29 MED ORDER — MORPHINE SULFATE (PF) 2 MG/ML IV SOLN: 1.0000 mg | INTRAVENOUS | Status: DC | PRN

## 2024-01-29 MED ORDER — ASPIRIN 81 MG PO TBEC
81.0000 mg | DELAYED_RELEASE_TABLET | Freq: Every day | ORAL | Status: DC
Start: 2024-01-29 — End: 2024-01-30
  Administered 2024-01-29 – 2024-01-30 (×2): 81 mg via ORAL
  Filled 2024-01-29 (×2): qty 1

## 2024-01-29 MED ORDER — SODIUM CHLORIDE 0.9 % IV SOLN: INTRAVENOUS | Status: AC

## 2024-01-29 MED ORDER — ROSUVASTATIN CALCIUM 20 MG PO TABS
20.0000 mg | ORAL_TABLET | Freq: Every day | ORAL | Status: DC
  Administered 2024-01-29 – 2024-01-30 (×2): 20 mg via ORAL
  Filled 2024-01-29 (×2): qty 1

## 2024-01-29 MED ORDER — OXYCODONE HCL 5 MG PO TABS: 5.0000 mg | ORAL_TABLET | ORAL | Status: DC | PRN

## 2024-01-29 MED ORDER — CHLORHEXIDINE GLUCONATE 4 % EX SOLN
60.0000 mL | Freq: Once | CUTANEOUS | Status: DC
  Filled 2024-01-29: qty 60

## 2024-01-29 MED ORDER — CHLORHEXIDINE GLUCONATE 0.12 % MT SOLN
15.0000 mL | Freq: Once | OROMUCOSAL | Status: AC
  Administered 2024-01-29: 15 mL via OROMUCOSAL
  Filled 2024-01-29: qty 15

## 2024-01-29 MED ORDER — NOREPINEPHRINE 4 MG/250ML-% IV SOLN
0.0000 ug/min | INTRAVENOUS | Status: DC
  Filled 2024-01-29: qty 250

## 2024-01-29 MED ORDER — SERTRALINE HCL 50 MG PO TABS
50.0000 mg | ORAL_TABLET | Freq: Every day | ORAL | Status: DC
  Administered 2024-01-29: 50 mg via ORAL
  Filled 2024-01-29: qty 1

## 2024-01-29 MED ORDER — HEPARIN (PORCINE) IN NACL 1000-0.9 UT/500ML-% IV SOLN
INTRAVENOUS | Status: DC | PRN
  Administered 2024-01-29 (×3): 500 mL

## 2024-01-29 MED ORDER — CHLORHEXIDINE GLUCONATE 4 % EX SOLN: 60.0000 mL | Freq: Once | CUTANEOUS | Status: DC

## 2024-01-29 MED ORDER — NITROGLYCERIN IN D5W 200-5 MCG/ML-% IV SOLN: 0.0000 ug/min | INTRAVENOUS | Status: DC

## 2024-01-29 MED ORDER — SODIUM CHLORIDE 0.9% FLUSH: 3.0000 mL | INTRAVENOUS | Status: DC | PRN

## 2024-01-29 MED ORDER — VANCOMYCIN HCL IN DEXTROSE 1-5 GM/200ML-% IV SOLN
1000.0000 mg | Freq: Once | INTRAVENOUS | Status: AC
  Administered 2024-01-29: 1000 mg via INTRAVENOUS
  Filled 2024-01-29: qty 200

## 2024-01-29 MED ORDER — SODIUM CHLORIDE 0.9 % IV SOLN: INTRAVENOUS | Status: DC

## 2024-01-29 MED ORDER — SODIUM CHLORIDE 0.9 % IV SOLN: 250.0000 mL | INTRAVENOUS | Status: DC | PRN

## 2024-01-29 MED ORDER — HEPARIN SODIUM (PORCINE) 1000 UNIT/ML IJ SOLN
INTRAMUSCULAR | Status: DC | PRN
  Administered 2024-01-29: 11000 [IU] via INTRAVENOUS

## 2024-01-29 MED ORDER — FENTANYL CITRATE (PF) 100 MCG/2ML IJ SOLN
INTRAMUSCULAR | Status: AC
Start: 2024-01-29 — End: 2024-01-29
  Filled 2024-01-29: qty 2

## 2024-01-29 MED ORDER — PROTAMINE SULFATE 10 MG/ML IV SOLN
INTRAVENOUS | Status: DC | PRN
  Administered 2024-01-29: 110 mg via INTRAVENOUS

## 2024-01-29 MED ORDER — SODIUM CHLORIDE 0.9% FLUSH
3.0000 mL | Freq: Two times a day (BID) | INTRAVENOUS | Status: DC
  Administered 2024-01-29 – 2024-01-30 (×2): 3 mL via INTRAVENOUS

## 2024-01-29 MED ORDER — LIDOCAINE-EPINEPHRINE 1 %-1:100000 IJ SOLN
INTRAMUSCULAR | Status: DC | PRN
  Administered 2024-01-29: 2 mL via INTRADERMAL

## 2024-01-29 MED ORDER — ALBUMIN HUMAN 5 % IV SOLN
INTRAVENOUS | Status: AC
  Administered 2024-01-29: 12.5 g via INTRAVENOUS
  Filled 2024-01-29: qty 250

## 2024-01-29 MED ORDER — TRAMADOL HCL 50 MG PO TABS: 50.0000 mg | ORAL_TABLET | ORAL | Status: DC | PRN

## 2024-01-29 MED ORDER — FENTANYL CITRATE (PF) 100 MCG/2ML IJ SOLN
INTRAMUSCULAR | Status: DC | PRN
  Administered 2024-01-29 (×2): 25 ug via INTRAVENOUS

## 2024-01-29 MED ORDER — ALBUMIN HUMAN 5 % IV SOLN: 12.5000 g | Freq: Once | INTRAVENOUS | Status: AC

## 2024-01-29 MED ORDER — ACETAMINOPHEN 650 MG RE SUPP
650.0000 mg | Freq: Four times a day (QID) | RECTAL | Status: DC | PRN
  Filled 2024-01-29: qty 1

## 2024-01-29 MED ORDER — PROPOFOL 10 MG/ML IV BOLUS
INTRAVENOUS | Status: DC | PRN
  Administered 2024-01-29: 20 mg via INTRAVENOUS
  Administered 2024-01-29 (×3): 10 mg via INTRAVENOUS

## 2024-01-29 MED ORDER — IODIXANOL 320 MG/ML IV SOLN
INTRAVENOUS | Status: DC | PRN
  Administered 2024-01-29: 50 mL

## 2024-01-29 MED ORDER — PANTOPRAZOLE SODIUM 40 MG PO TBEC
40.0000 mg | DELAYED_RELEASE_TABLET | Freq: Every day | ORAL | Status: DC
  Administered 2024-01-29 – 2024-01-30 (×2): 40 mg via ORAL
  Filled 2024-01-29 (×2): qty 1

## 2024-01-29 MED ORDER — ACETAMINOPHEN 325 MG PO TABS
650.0000 mg | ORAL_TABLET | Freq: Four times a day (QID) | ORAL | Status: DC | PRN
  Administered 2024-01-29: 650 mg via ORAL
  Filled 2024-01-29: qty 2

## 2024-01-29 MED ORDER — NOREPINEPHRINE BITARTRATE 1 MG/ML IV SOLN
INTRAVENOUS | Status: DC | PRN
  Administered 2024-01-29: .5 mL via INTRAVENOUS

## 2024-01-29 MED ORDER — LIDOCAINE HCL (PF) 1 % IJ SOLN
INTRAMUSCULAR | Status: DC | PRN
  Administered 2024-01-29 (×2): 10 mL via INTRADERMAL

## 2024-01-29 NOTE — Progress Notes (Signed)
 MD Wonda made aware of patient status during manual pressure hold. MD stated he would be by to see pt.

## 2024-01-29 NOTE — Discharge Instructions (Signed)

## 2024-01-29 NOTE — Interval H&P Note (Signed)
 History and Physical Interval Note:  01/29/2024 9:22 AM  Taylor Stanley  has presented today for surgery, with the diagnosis of severe aortic stenosis.  The various methods of treatment have been discussed with the patient and family. After consideration of risks, benefits and other options for treatment, the patient has consented to  Procedure(s): Transcatheter Aortic Valve Replacement, Transfemoral (N/A) ECHOCARDIOGRAM, TRANSTHORACIC (N/A) as a surgical intervention.  The patient's history has been reviewed, patient examined, no change in status, stable for surgery.  I have reviewed the patient's chart and labs.  Questions were answered to the patient's satisfaction.     [name] [name]

## 2024-01-29 NOTE — Op Note (Signed)
 [facility]   MULTIDISCIPLINARY HEART VALVE TEAM   TAVR OPERATIVE NOTE   Date of Procedure:  01/29/2024  Preoperative Diagnosis: Severe Aortic Stenosis   Postoperative Diagnosis: Same   Procedure:   Transcatheter Aortic Valve Replacement - Percutaneous Right Transfemoral Approach  Edwards Sapien 3 Ultra Resilia THV (size 23 mm, model # 9755RSL, serial # [id number])   Co-Surgeons:  Dorise LOIS Fellers, MD and Ozell Fell, MD    Anesthesiologist:  J. Erma, MD  Echocardiographer:  CHRISTELLA. Croitoru, MD  Pre-operative Echo Findings: Severe aortic stenosis Normal left ventricular systolic function  Post-operative Echo Findings: trivial paravalvular leak Normal left ventricular systolic function   BRIEF CLINICAL NOTE AND INDICATIONS FOR SURGERY  This 68 year old woman has stage D, severe, symptomatic aortic stenosis with NYHA class II symptoms of exertional fatigue and shortness of breath consistent with chronic diastolic congestive heart.  I have reviewed her 2D echocardiogram, cardiac catheterization, and CTA studies.  Her echocardiogram shows a severely calcified aortic valve with restricted leaflet mobility.  The mean gradient was 33 mmHg with a valve area 0.84 cm and a dimensionless index of 0.24 consistent with severe aortic stenosis.  Cardiac catheterization showed moderate nonobstructive coronary disease with a 50% proximal RCA stenosis.  I agree that aortic valve replacement is indicated in this patient for relief of her symptoms and to prevent progressive left ventricular dysfunction.  Given her age and comorbidities I think that transcatheter aortic valve replacement would be the best option for her.  Her gated cardiac CTA shows anatomy suitable for TAVR 23 mm Edwards SAPIEN 3 valve.  Her abdominal and pelvic CTA shows adequate pelvic vascular anatomy to allow transfemoral insertion.   The patient and her husband were counseled at length regarding treatment  alternatives for management of severe symptomatic aortic stenosis. The risks and benefits of surgical intervention has been discussed in detail. Long-term prognosis with medical therapy was discussed. Alternative approaches such as conventional surgical aortic valve replacement, transcatheter aortic valve replacement, and palliative medical therapy were compared and contrasted at length. This discussion was placed in the context of the patient's own specific clinical presentation and past medical history. All of their questions have been addressed.    Following the decision to proceed with transcatheter aortic valve replacement, a discussion was held regarding what types of management strategies would be attempted intraoperatively in the event of life-threatening complications, including whether or not the patient would be considered a candidate for the use of cardiopulmonary bypass and/or conversion to open sternotomy for attempted surgical intervention.  I think she would be a candidate for emergent sternotomy to manage any intraoperative complications.  The patient has been advised of a variety of complications that might develop including but not limited to risks of death, stroke, paravalvular leak, aortic dissection or other major vascular complications, aortic annulus rupture, device embolization, cardiac rupture or perforation, mitral regurgitation, acute myocardial infarction, arrhythmia, heart block or bradycardia requiring permanent pacemaker placement, congestive heart failure, respiratory failure, renal failure, pneumonia, infection, other late complications related to structural valve deterioration or migration, or other complications that might ultimately cause a temporary or permanent loss of functional independence or other long term morbidity. The patient provides full informed consent for the procedure as described and all questions were answered.     DETAILS OF THE OPERATIVE  PROCEDURE  PREPARATION:    The patient was brought to the operating room on the above mentioned date and appropriate monitoring was established by the anesthesia team.  The patient was placed in the supine position on the operating table.  Intravenous antibiotics were administered. The patient was monitored closely throughout the procedure under conscious sedation.   Baseline transthoracic echocardiogram was performed. The patient's abdomen and both groins were prepped and draped in a sterile manner. A time out procedure was performed.   PERIPHERAL ACCESS:    Using the modified Seldinger technique, femoral arterial and venous access was obtained with placement of 6 Fr sheaths on the left side.  A pigtail diagnostic catheter was passed through the left arterial sheath under fluoroscopic guidance into the aortic root.  A temporary transvenous pacemaker catheter was passed through the left femoral venous sheath under fluoroscopic guidance into the right ventricle.  The pacemaker was tested to ensure stable lead placement and pacemaker capture. Aortic root angiography was performed in order to determine the optimal angiographic angle for valve deployment.   TRANSFEMORAL ACCESS:   Percutaneous transfemoral access and sheath placement was performed using ultrasound guidance.  The right common femoral artery was cannulated using a micropuncture needle and appropriate location was verified using hand injection angiogram.  A pair of Abbott Perclose percutaneous closure devices were placed and a 6 French sheath replaced into the femoral artery.  The patient was heparinized systemically and ACT verified > 250 seconds.    A 14 Fr transfemoral E-sheath was introduced into the right common femoral artery after progressively dilating over an Amplatz superstiff wire. An AL-1 catheter was used to direct a straight-tip exchange length wire across the native aortic valve into the left ventricle. This was exchanged out  for a pigtail catheter and position was confirmed in the LV apex. Simultaneous LV and Ao pressures were recorded.  The pigtail catheter was exchanged for a Safari wire in the LV apex.   BALLOON AORTIC VALVULOPLASTY:   Not performed   TRANSCATHETER HEART VALVE DEPLOYMENT:   An Edwards Sapien 3 Ultra transcatheter heart valve (size 23 mm) was prepared and crimped per manufacturer's guidelines, and the proper orientation of the valve is confirmed on the [employer] delivery system. The valve was advanced through the introducer sheath using normal technique until in an appropriate position in the abdominal aorta beyond the sheath tip. The balloon was then retracted and using the fine-tuning wheel was centered on the valve. The valve was then advanced across the aortic arch using appropriate flexion of the catheter. The valve was carefully positioned across the aortic valve annulus. The Commander catheter was retracted using normal technique. Once final position of the valve has been confirmed by angiographic assessment, the valve is deployed during rapid ventricular pacing to maintain systolic blood pressure < 50 mmHg and pulse pressure < 10 mmHg. The balloon inflation is held for >3 seconds after reaching full deployment volume. Once the balloon has fully deflated the balloon is retracted into the ascending aorta and valve function is assessed using echocardiography. There is felt to be trivial paravalvular leak and no central aortic insufficiency.  The patient's hemodynamic recovery following valve deployment is good.  The deployment balloon and guidewire are both removed.    PROCEDURE COMPLETION:   The sheath was removed and femoral artery closure performed.  Protamine was administered once femoral arterial repair was complete. The temporary pacemaker, pigtail catheter and femoral sheaths were removed with manual pressure used for venous hemostasis.  A Mynx femoral closure device was utilized  following removal of the diagnostic sheath in the left femoral artery.  The patient tolerated the procedure well and  is transported to the cath lab recovery area in stable condition. There were no immediate intraoperative complications. All sponge instrument and needle counts are verified correct at completion of the operation.   No blood products were administered during the operation.  The patient received a total of 50 mL of intravenous contrast during the procedure.   Dorise MARLA Fellers, MD 01/29/2024

## 2024-01-29 NOTE — Transfer of Care (Signed)
 Immediate Anesthesia Transfer of Care Note  Patient: Taylor Stanley  Procedure(s) Performed: Transcatheter Aortic Valve Replacement, Transfemoral ECHOCARDIOGRAM, TRANSTHORACIC  Patient Location: Cath Lab  Anesthesia Type:MAC  Level of Consciousness: awake, alert , and oriented  Airway & Oxygen Therapy: Patient Spontanous Breathing and Patient connected to face mask oxygen  Post-op Assessment: Report given to RN, Post -op Vital signs reviewed and stable, Patient moving all extremities X 4, and Patient able to stick tongue midline  Post vital signs: Reviewed and stable  Last Vitals:  Vitals Value Taken Time  BP 101/63 01/29/24 10:52  Temp 98.6   Pulse 55 01/29/24 10:54  Resp 16 01/29/24 10:54  SpO2 97 % 01/29/24 10:54  Vitals shown include unfiled device data.  Last Pain:  Vitals:   01/29/24 0947  PainSc: 0-No pain         Complications: There were no known notable events for this encounter.

## 2024-01-29 NOTE — Progress Notes (Signed)
 Site area: Left groin Site Prior to Removal:  Level 0 Pressure Applied For:15 minutes Manual:   Yes Patient Status During Pull:  Stable- see comments Post Pull Site:  Level 0 Post Pull Instructions Given:  Yes Post Pull Pulses Present: Yes Dressing Applied: gauze and tegaderm  Bedrest begins @  Comments:1148  During manual hold pt BP dropped to 79/51. Pt placed in trendelenburg. NS Fluid bolus given, Anesthesia was called, and verbal order for Albumin was received. Md Erma came to assess pt. Post manual hold: HR 49, BP 114/64 (78), O2 sat 98% on RA, RR 12.

## 2024-01-29 NOTE — Anesthesia Postprocedure Evaluation (Signed)
 Anesthesia Post Note  Patient: Taylor Stanley  Procedure(s) Performed: Transcatheter Aortic Valve Replacement, Transfemoral ECHOCARDIOGRAM, TRANSTHORACIC     Patient location during evaluation: PACU Anesthesia Type: MAC Level of consciousness: awake and alert Pain management: pain level controlled Vital Signs Assessment: post-procedure vital signs reviewed and stable Respiratory status: spontaneous breathing, nonlabored ventilation, respiratory function stable and patient connected to nasal cannula oxygen Cardiovascular status: stable and blood pressure returned to baseline Postop Assessment: no apparent nausea or vomiting Anesthetic complications: no   There were no known notable events for this encounter.  Last Vitals:  Vitals:   01/29/24 1145 01/29/24 1200  BP: 96/63 114/64  Pulse: (!) 49 (!) 49  Resp: 13 14  Temp:    SpO2: 96% 98%    Last Pain:  Vitals:   01/29/24 1056  TempSrc: Oral  PainSc: 0-No pain                 Thom JONELLE Peoples

## 2024-01-29 NOTE — Op Note (Signed)
 HEART AND VASCULAR CENTER   MULTIDISCIPLINARY HEART VALVE TEAM   TAVR OPERATIVE NOTE   Date of Procedure:  01/29/2024  Preoperative Diagnosis: Severe Aortic Stenosis   Postoperative Diagnosis: Same   Procedure:   Transcatheter Aortic Valve Replacement - Percutaneous  Transfemoral Approach  Edwards Sapien 3 Ultra Resilia THV (size 23 mm, serial # [id number])   Co-Surgeons:  Dorise Fellers, MD and Ozell Fell, MD  Anesthesiologist:  Thom Peoples, MD  Echocardiographer:  Jerel Balding, MD  Pre-operative Echo Findings: Severe aortic stenosis Normal left ventricular systolic function  Post-operative Echo Findings: Trace paravalvular leak Normal left ventricular systolic function  BRIEF CLINICAL NOTE AND INDICATIONS FOR SURGERY  68 year old woman with severe, stage D 1 aortic stenosis with NYHA functional class II symptoms of fatigue and shortness of breath.  She presents today for TAVR after imaging studies demonstrate appropriate anatomic characteristics for transfemoral TAVR.  During the course of the patient's preoperative work up they have been evaluated comprehensively by a multidisciplinary team of specialists coordinated through the Multidisciplinary Heart Valve Clinic in the [facility].  They have been demonstrated to suffer from symptomatic severe aortic stenosis as noted above. The patient has been counseled extensively as to the relative risks and benefits of all options for the treatment of severe aortic stenosis including long term medical therapy, conventional surgery for aortic valve replacement, and transcatheter aortic valve replacement.  The patient has been independently evaluated in formal cardiac surgical consultation by Dr Fellers, who deemed the patient appropriate for TAVR. Based upon review of all of the patient's preoperative diagnostic tests they are felt to be candidate for transcatheter aortic valve replacement using the transfemoral  approach as an alternative to conventional surgery.    Following the decision to proceed with transcatheter aortic valve replacement, a discussion has been held regarding what types of management strategies would be attempted intraoperatively in the event of life-threatening complications, including whether or not the patient would be considered a candidate for the use of cardiopulmonary bypass and/or conversion to open sternotomy for attempted surgical intervention.  The patient has been advised of a variety of complications that might develop peculiar to this approach including but not limited to risks of death, stroke, paravalvular leak, aortic dissection or other major vascular complications, aortic annulus rupture, device embolization, cardiac rupture or perforation, acute myocardial infarction, arrhythmia, heart block or bradycardia requiring permanent pacemaker placement, congestive heart failure, respiratory failure, renal failure, pneumonia, infection, other late complications related to structural valve deterioration or migration, or other complications that might ultimately cause a temporary or permanent loss of functional independence or other long term morbidity.  The patient provides full informed consent for the procedure as described and all questions were answered preoperatively.  DETAILS OF THE OPERATIVE PROCEDURE  PREPARATION:   The patient is brought to the operating room on the above mentioned date. The patient is placed in the supine position on the operating table.  Intravenous antibiotics are administered. The patient is monitored closely throughout the procedure under conscious sedation.  Baseline transthoracic echocardiogram is performed. The patient's chest, abdomen, both groins, and both lower extremities are prepared and draped in a sterile manner. A time out procedure is performed.   PERIPHERAL ACCESS:   Using ultrasound guidance, femoral arterial and venous access is  obtained with placement of 6 Fr sheaths on the left side.  US  images are digitally captured and stored in the patient's chart. A pigtail diagnostic catheter was passed through the  femoral arterial sheath under fluoroscopic guidance into the aortic root.  A temporary transvenous pacemaker catheter was passed through the femoral venous sheath under fluoroscopic guidance into the right ventricle.  The pacemaker was tested to ensure stable lead placement and pacemaker capture. Aortic root angiography was performed in order to determine the optimal angiographic angle for valve deployment.  TRANSFEMORAL ACCESS:  A micropuncture technique is used to access the right femoral artery under fluoroscopic and ultrasound guidance.  2 Perclose devices are deployed at 10' and 2' positions to 'PreClose' the femoral artery. An 8 French sheath is placed and then an Amplatz Superstiff wire is advanced through the sheath. This is changed out for a 14 French transfemoral E-Sheath after progressively dilating over the Superstiff wire.  An AL-1 catheter was used to direct a straight-tip exchange length wire across the native aortic valve into the left ventricle. This was exchanged out for a pigtail catheter and position was confirmed in the LV apex. Simultaneous LV and Ao pressures were recorded.  The pigtail catheter was exchanged for a Safari wire in the LV apex.    BALLOON AORTIC VALVULOPLASTY:  Not performed  TRANSCATHETER HEART VALVE DEPLOYMENT:  An Edwards Sapien 3 Ultra Resilia transcatheter heart valve (size 23 mm) was prepared and crimped per manufacturer's guidelines, and the proper orientation of the valve is confirmed on the [employer] delivery system. The valve was advanced through the introducer sheath using normal technique until in an appropriate position in the abdominal aorta beyond the sheath tip. The balloon was then retracted and using the fine-tuning wheel was centered on the valve. The valve was then  advanced across the aortic arch using appropriate flexion of the catheter. The valve was carefully positioned across the aortic valve annulus. The Commander catheter was retracted using normal technique. Once final position of the valve has been confirmed by angiographic assessment, the valve is deployed while temporarily holding ventilation and during rapid ventricular pacing to maintain systolic blood pressure < 50 mmHg and pulse pressure < 10 mmHg. The balloon inflation is held for >3 seconds after reaching full deployment volume. Once the balloon has fully deflated the balloon is retracted into the ascending aorta and valve function is assessed using echocardiography. The patient's hemodynamic recovery following valve deployment is good.  The deployment balloon and guidewire are both removed. Echo demostrated acceptable post-procedural gradients, stable mitral valve function, and trivial aortic insufficiency.    PROCEDURE COMPLETION:  The sheath was removed and femoral artery closure is performed using the 2 previously deployed Perclose devices.  Protamine is administered once femoral arterial repair was complete. The site is clear with no evidence of bleeding or hematoma after the sutures are tightened. The temporary pacemaker and pigtail catheters are removed. Mynx closure is used for contralateral femoral arterial hemostasis for the 6 Fr sheath.  The patient tolerated the procedure well and is transported to the recovery area in stable condition. There were no immediate intraoperative complications. All sponge instrument and needle counts are verified correct at completion of the operation.   The patient received a total of 50 mL of intravenous contrast during the procedure.  EBL: minimal  LVEDP: 12 mmHg   Ozell Fell, MD 01/29/2024 11:16 AM

## 2024-01-29 NOTE — Discharge Summary (Incomplete)
 HEART AND VASCULAR CENTER   MULTIDISCIPLINARY HEART VALVE TEAM  Discharge Summary    Patient ID: Taylor Stanley MRN: [id number]; DOB: 1956/03/15  Admit date: 01/29/2024 Discharge date: 01/30/2024  PCP:  [name], NP  [facility] Cardiologist:  Newman JINNY Lawrence, MD  [facility] Structural heart: Ozell Fell, MD [facility] Electrophysiologist:  None   Discharge Diagnoses    Principal Problem:   S/P TAVR (transcatheter aortic valve replacement) Active Problems:   Essential hypertension   GERD   Nonrheumatic aortic valve stenosis   Rheumatoid arthritis (HCC)   Allergies Allergies  Allergen Reactions   Penicillins Hives, Swelling and Rash   Remicade [Infliximab] Other (See Comments)    malaise/headache after infusion   Poison Ivy Extract Hives and Rash    Diagnostic Studies/Procedures    TAVR OPERATIVE NOTE     Date of Procedure:                01/29/2024   Preoperative Diagnosis:      Severe Aortic Stenosis    Postoperative Diagnosis:    Same    Procedure:        Transcatheter Aortic Valve Replacement - Percutaneous Right Transfemoral Approach             Edwards Sapien 3 Ultra Resilia THV (size 23 mm, model # 9755RSL, serial # [id number])              Co-Surgeons:                        Dorise LOIS Fellers, MD and Ozell Fell, MD      Anesthesiologist:                  DOROTHA Peoples, MD   Echocardiographer:              EMERSON Balding, MD   Pre-operative Echo Findings: Severe aortic stenosis Normal left ventricular systolic function   Post-operative Echo Findings: trivial paravalvular leak Normal left ventricular systolic function _____________    Echo 01/30/24: completed but pending formal read at the time of discharge   History of Present Illness     Taylor Stanley is a 68 y.o. female with a history of HTN, HLD, RA on golimumab immunotherapy, non obst CAD, obesity (BMI 31) and severe AS who presented to [facility] on 01/29/24 for planned TAVR.    She developed exertional SOB and fatigue. Her most recent echocardiogram on 11/02/2023 showed EF 70% and severe AS with a mean grad 33 mmHg, Vmax 3.98 m/s, AVA 0.84 cm2, and mild AI/MR. L/[facility]HC 11/26/23 showed moderate nonobstructive coronary disease with a 50% proximal RCA stenosis.   The patient was evaluated by the multidisciplinary valve team and felt to have severe, symptomatic aortic stenosis and to be a suitable candidate for TAVR, which was set up for 01/29/24.   Hospital Course     Consultants: none   Severe AS:  -- S/p successful TAVR with a 23 mm Edwards Sapien 3 Ultra Resilia THV via the TF approach on 01/29/24.  -- Post operative echo completed but pending formal read. -- Groin sites are stable.  -- ECG/tele with sinus and no high grade heart block. -- Continue Asprin 81mg  daily.  -- Met with cardiac rehab to discuss CRP phase II.  -- Plan for discharge home today with close follow up in the outpatient setting.   HTN: -- BP well controlled.  -- Resume home Coreg  3.125mg   BID & Benicar HCT 40-25mg  daily.  -- Hold amlodipine  5mg  daily at discharge and watch BP. Pt would like to come off as many meds as possible.   CAD: -- [facility] 11/26/23 showed moderate nonobstructive coronary disease with a 50% proximal RCA stenosis.  -- Continue Asprin 81mg  daily.  -- Continue Crestor  20mg  daily.  Fever: -- Tmax 100.0 deg F. -- White count mildly elevated at 13.8 -- No localizing symptoms. -- Pt would prefer for discharge home with supportive measures.  _____________  Discharge Vitals Blood pressure 139/66, pulse 66, temperature 98.4 F (36.9 C), temperature source Axillary, resp. rate 15, height 5' (1.524 m), weight 72.5 kg, SpO2 93%.  Filed Weights   01/29/24 0712 01/29/24 1455 01/30/24 0507  Weight: 73 kg 76.7 kg 72.5 kg     GEN: Well nourished, well developed in no acute distress NECK: No JVD CARDIAC: RRR, 1/4 systolic murmur @ RUSB. No rubs, gallops RESPIRATORY:  Clear to  auscultation without rales, wheezing or rhonchi  ABDOMEN: Soft, non-tender, non-distended EXTREMITIES:  No edema; No deformity.  Groin sites clear without hematoma or ecchymosis. Right bandage saturated with blood.    Disposition   Pt is being discharged home today in good condition.  Follow-up Plans & Appointments     Follow-up Information     Sebastian Lamarr SAUNDERS, PA-C. Go on 02/04/2024.   Specialties: Cardiology, Radiology Why: @ 11:10am, please arrive at least 15 minutes early. Contact information: [address] [place] [address] [phone]                Discharge Instructions     Amb Referral to Cardiac Rehabilitation   Complete by: As directed    Diagnosis: Valve Replacement   Valve: Aortic Comment - tavr   After initial evaluation and assessments completed: Virtual Based Care may be provided alone or in conjunction with Phase 2 Cardiac Rehab based on patient barriers.: Yes   Intensive Cardiac Rehabilitation (ICR) [facility] location only OR Traditional Cardiac Rehabilitation (TCR) *If criteria for ICR are not met will enroll in TCR [facility] only): Yes       Discharge Medications   Allergies as of 01/30/2024       Reactions   Penicillins Hives, Swelling, Rash   Remicade [infliximab] Other (See Comments)   malaise/headache after infusion   Poison Ivy Extract Hives, Rash        Medication List     PAUSE taking these medications    amLODipine  5 MG tablet Wait to take this until your doctor or other care provider tells you to start again. Commonly known as: NORVASC  Take 5 mg by mouth daily.       TAKE these medications    albuterol  108 (90 Base) MCG/ACT inhaler Commonly known as: VENTOLIN  HFA TAKE 2 PUFFS BY MOUTH EVERY 4 HOURS AS NEEDED   Arnuity Ellipta  200 MCG/ACT Aepb Generic drug: Fluticasone  Furoate INHALE 1 PUFF BY MOUTH DAILY   ascorbic acid 500 MG tablet Commonly known as: VITAMIN C Take 1,000 mg by mouth daily.   aspirin  EC  81 MG tablet Take 1 tablet (81 mg total) by mouth daily. Swallow whole. What changed: when to take this   carvedilol  3.125 MG tablet Commonly known as: Coreg  Take 1 tablet (3.125 mg total) by mouth 2 (two) times daily with a meal.   cholecalciferol 1000 units tablet Commonly known as: VITAMIN D Take 1,000 Units by mouth in the morning.   clobetasol cream 0.05 % Commonly known as: TEMOVATE  Apply 1 Application topically 2 (two) times daily as needed (rash).   desloratadine  5 MG tablet Commonly known as: CLARINEX  TAKE 1 TABLET BY MOUTH DAILY AS NEEDED What changed: when to take this   esomeprazole  40 MG capsule Commonly known as: NEXIUM  TAKE ONE CAPSULE BY MOUTH EVERY MORNING   fluticasone  50 MCG/ACT nasal spray Commonly known as: FLONASE  SPRAY 1-2 SPRAYS INTO EACH NOSTRIL 1-7 times per week. What changed:  how much to take how to take this when to take this reasons to take this   ibuprofen 200 MG tablet Commonly known as: ADVIL Take 200 mg by mouth every 6 (six) hours as needed for moderate pain (pain score 4-6).   nitroGLYCERIN  0.4 MG SL tablet Commonly known as: NITROSTAT  Place 1 tablet (0.4 mg total) under the tongue every 5 (five) minutes as needed for chest pain.   olmesartan-hydrochlorothiazide 40-25 MG tablet Commonly known as: BENICAR HCT Take 1 tablet by mouth in the morning.   rosuvastatin  20 MG tablet Commonly known as: CRESTOR  Take 1 tablet (20 mg total) by mouth daily.   sertraline 100 MG tablet Commonly known as: ZOLOFT Take 50 mg by mouth at bedtime.   SIMPONI ARIA IV Inject 1 Dose into the vein every 2 (two) months.   triamcinolone cream 0.1 % Commonly known as: KENALOG Apply 1 Application topically 2 (two) times daily as needed (skin irritation).          Outstanding Labs/Studies   none  ______________________  Duration of Discharge Encounter: APP Time: 15 minutes    Signed, Lamarr Hummer, PA-C 01/30/2024, 10:19  AM [phone]  The patient is independently interviewed and examined.  I agree with the clinical findings, assessment, and plan as outlined above by Izetta Hummer, PA-C.SABRA  Findings from my exam today are below:  Vitals:   01/30/24 0507 01/30/24 0743  BP:  139/66  Pulse:    Resp: (!) 21 15  Temp:  98.4 F (36.9 C)  SpO2:       Pt is alert and oriented, NAD HEENT: normal Neck: JVP - normal Lungs: CTA bilaterally CV: RRR with 1/6 systolic murmur at the right upper sternal border Abd: soft, NT, Positive BS, no hepatomegaly Ext: no C/C/E, distal pulses intact and equal.  Bilateral groin site(s) clear Skin: warm/dry no rash  Telemetry shows normal sinus rhythm with no significant arrhythmia.  The echo report is currently pending but I have reviewed the images.  The patient has normal LV function.  She has normal function of her transcatheter heart valve with a mean gradient of 7 mmHg.  There may be trivial AI as seen on color-flow.  There is no clear paravalvular regurgitation.  I do not appreciate any AI on Doppler assessment.  The patient has exam  MD time spent conducting this discharge is 20 minutes.  This includes my personal examination of the patient, review of vital signs, labs, echo findings, personal review of echo images, radiographic images, and discussion of post Hospital restrictions/recovery as well as recommendations for outpatient follow-up.  She has done very well with TAVR.  She did have shaking chills last night and a low-grade temperature up to 100 F.  No obvious infectious issue.  She will follow symptoms as an outpatient and she feels well today.  Ozell Fell 01/30/2024 12:03 PM

## 2024-01-29 NOTE — Anesthesia Preprocedure Evaluation (Addendum)
 Anesthesia Evaluation  Patient identified by MRN, date of birth, ID band Patient awake    Reviewed: Allergy & Precautions, H&P , NPO status , Patient's Chart, lab work & pertinent test results  History of Anesthesia Complications (+) PONV and history of anesthetic complications  Airway Mallampati: II  TM Distance: >3 FB Neck ROM: Full    Dental no notable dental hx.    Pulmonary asthma , former smoker   Pulmonary exam normal breath sounds clear to auscultation       Cardiovascular hypertension, + CAD and + Peripheral Vascular Disease  Normal cardiovascular exam+ Valvular Problems/Murmurs AS  Rhythm:Regular Rate:Normal  Hx of DVT 10 years ago  TTE: IMPRESSIONS     1. Left ventricular ejection fraction, by estimation, is 70 to 75%. The  left ventricle has hyperdynamic function. The left ventricle has no  regional wall motion abnormalities. Left ventricular diastolic parameters  are consistent with Grade I diastolic  dysfunction (impaired relaxation). The average left ventricular global  longitudinal strain is -19.4 %. The global longitudinal strain is normal.   2. Right ventricular systolic function is normal. The right ventricular  size is normal.   3. The mitral valve is normal in structure. Mild mitral valve  regurgitation. No evidence of mitral stenosis.   4. DVI: 0.24. The aortic valve is abnormal. There is severe calcifcation  of the aortic valve. There is severe thickening of the aortic valve.  Aortic valve regurgitation is mild. Severe aortic valve stenosis. Aortic  valve area, by VTI measures 0.84 cm.  Aortic valve mean gradient measures 33.0 mmHg. Aortic valve Vmax measures  3.98 m/s.   5. The inferior vena cava is normal in size with greater than 50%  respiratory variability, suggesting right atrial pressure of 3 mmHg.     Neuro/Psych  Headaches, Seizures -,  PSYCHIATRIC DISORDERS Anxiety Depression        GI/Hepatic Neg liver ROS,GERD  ,,  Endo/Other  negative endocrine ROS    Renal/GU negative Renal ROS  negative genitourinary   Musculoskeletal  (+) Arthritis ,    Abdominal   Peds negative pediatric ROS (+)  Hematology negative hematology ROS (+)   Anesthesia Other Findings   Reproductive/Obstetrics negative OB ROS                             Anesthesia Physical Anesthesia Plan  ASA: 4  Anesthesia Plan: MAC   Post-op Pain Management:    Induction: Intravenous  PONV Risk Score and Plan: 3 and Propofol  infusion and Treatment may vary due to age or medical condition  Airway Management Planned: Natural Airway and Simple Face Mask  Additional Equipment:   Intra-op Plan:   Post-operative Plan:   Informed Consent: I have reviewed the patients History and Physical, chart, labs and discussed the procedure including the risks, benefits and alternatives for the proposed anesthesia with the patient or authorized representative who has indicated his/her understanding and acceptance.     Dental advisory given  Plan Discussed with: CRNA  Anesthesia Plan Comments:         Anesthesia Quick Evaluation

## 2024-01-29 NOTE — Plan of Care (Signed)

## 2024-01-30 ENCOUNTER — Inpatient Hospital Stay (HOSPITAL_COMMUNITY)

## 2024-01-30 DIAGNOSIS — Z952 Presence of prosthetic heart valve: Secondary | ICD-10-CM

## 2024-01-30 LAB — ECHOCARDIOGRAM COMPLETE
AR max vel: 1.71 cm2
AV Area VTI: 1.8 cm2
AV Area mean vel: 1.68 cm2
AV Mean grad: 9 mmHg
AV Peak grad: 16.4 mmHg
Ao pk vel: 2.03 m/s
Area-P 1/2: 3.6 cm2
Calc EF: 64.7 %
Height: 60 in
S' Lateral: 2.3 cm
Single Plane A2C EF: 64.1 %
Single Plane A4C EF: 67.5 %
Weight: 2557.34 [oz_av]

## 2024-01-30 LAB — CBC
HCT: 36.9 % (ref 36.0–46.0)
Hemoglobin: 12.6 g/dL (ref 12.0–15.0)
MCH: 31.6 pg (ref 26.0–34.0)
MCHC: 34.1 g/dL (ref 30.0–36.0)
MCV: 92.5 fL (ref 80.0–100.0)
Platelets: 176 10*3/uL (ref 150–400)
RBC: 3.99 MIL/uL (ref 3.87–5.11)
RDW: 12.2 % (ref 11.5–15.5)
WBC: 13.8 10*3/uL — ABNORMAL HIGH (ref 4.0–10.5)
nRBC: 0 % (ref 0.0–0.2)

## 2024-01-30 LAB — MAGNESIUM: Magnesium: 2 mg/dL (ref 1.7–2.4)

## 2024-01-30 LAB — BASIC METABOLIC PANEL WITH GFR
Anion gap: 12 (ref 5–15)
BUN: 13 mg/dL (ref 8–23)
CO2: 23 mmol/L (ref 22–32)
Calcium: 9.2 mg/dL (ref 8.9–10.3)
Chloride: 103 mmol/L (ref 98–111)
Creatinine, Ser: 0.7 mg/dL (ref 0.44–1.00)
GFR, Estimated: >60 mL/min (ref >60)
Glucose, Bld: 144 mg/dL — ABNORMAL HIGH (ref 70–99)
Potassium: 3.5 mmol/L (ref 3.5–5.1)
Sodium: 138 mmol/L (ref 135–145)

## 2024-01-30 NOTE — Progress Notes (Signed)
   01/30/24 1146  TOC Brief Assessment  Insurance and Status Reviewed  Patient has primary care physician Yes  Home environment has been reviewed home  Prior level of function: self/indep.  Prior/Current Home Services No current home services  Social Drivers of Health Review SDOH reviewed no interventions necessary  Readmission risk has been reviewed Yes  Transition of care needs no transition of care needs at this time    Pt stable for transition home today, no HH or DME needs noted. Family to transport home.

## 2024-01-30 NOTE — Progress Notes (Signed)
 Discussed with pt restrictions, exercise, and CRPII. Pt very receptive and is interested in CRPII. Will refer to [place] CRPII. She has been ambulating well.  [id number] Aliene Aris BS, ACSM-CEP 01/30/2024 10:09 AM

## 2024-01-31 ENCOUNTER — Telehealth: Payer: Self-pay | Admitting: Physician Assistant

## 2024-01-31 NOTE — Telephone Encounter (Signed)
  HEART AND VASCULAR CENTER   MULTIDISCIPLINARY HEART VALVE TEAM   Patient contacted regarding discharge from [facility] on 01/30/24  Patient understands to follow up with a structural heart provider on 02/04/24 at [address] [facility]. Patient understands discharge instructions? yes Patient understands medications and regimen? yes Patient understands to bring all medications to this visit? yes  Lamarr Hummer PA-C  [facility]

## 2024-02-01 NOTE — Progress Notes (Unsigned)
 [facility]   MULTIDISCIPLINARY HEART VALVE CLINIC                                     Cardiology Office Note:    Date:  02/05/2024   ID:  Taylor [facility], DOB Sep 22, 1955, MRN [id number]  PCP:  Taylor Suzen HERO, NP  [facility] Cardiologist:  Newman Taylor Lawrence, MD  [facility] Structural heart: Taylor Fell, MD [facility] Electrophysiologist:  None   Referring MD: Taylor Suzen HERO, NP   Taylor [facility] s/p TAVR  History of Present Illness:    Taylor [facility] is a 68 y.o. female with a hx of HTN, HLD, RA on golimumab immunotherapy, non obst CAD, obesity (BMI 31) and severe AS s/p TAVR (01/29/24) who presents to clinic for follow up.   She developed exertional SOB and fatigue. Echocardiogram 11/02/2023 showed EF 70% and severe AS with a mean grad 33 mmHg, Vmax 3.98 m/s, AVA 0.84 cm2, and mild AI/MR. L/RHC 11/26/23 showed moderate nonobstructive coronary disease with a 50% proximal RCA stenosis. S/p TAVR with a 23 mm Edwards Sapien 3 Ultra Resilia THV via the TF approach on 01/29/24. Post operative echo showed EF 60%, normally functioning TAVR with a mean gradient of 9 mmHg and trivial PVL. She did have shaking chills and a low-grade temperature up to 100 but improved on POD1 and preferred discharge home. She was discharged on aspirin  81mg  daily. Given normotensive BPs her home Norvasc  5mg  daily was held at discharge.   Today the patient presents to clinic for follow up. Here with her husband and planing to leave for the beach. No CP. She has had a significant improvement in SOB since TAVR.  No LE edema, orthopnea or PND. No dizziness or syncope. No blood in stool or urine. No palpitations.    Past Medical History:  Diagnosis Date   Anxiety    Asthma    triggered by allergies; does not use inhaler daily   Carpal tunnel syndrome of right wrist 11/2011   Dental crowns present    Depression    GERD (gastroesophageal reflux disease)    Hx of colonic polyps    Hypertension     under control; has been on med. x 5 yrs.   Osteopenia    PONV (postoperative nausea and vomiting)    S/P TAVR (transcatheter aortic valve replacement) 01/29/2024   s/p TAVR with a 23 mm Edwards S3UR via the TF approach by Dr. Fell and Dr. Lucas   Seasonal allergies    Seizure [facility]) as an infant   x 1 - unknown cause   Snores    denies apnea   TMJ disease    Wears glasses      Current Medications: Current Meds  Medication Sig   albuterol  (VENTOLIN  HFA) 108 (90 Base) MCG/ACT inhaler TAKE 2 PUFFS BY MOUTH EVERY 4 HOURS AS NEEDED   aspirin  EC 81 MG tablet Take 1 tablet (81 mg total) by mouth daily. Swallow whole.   azithromycin  (ZITHROMAX ) 500 MG tablet Take 1 tablet (500 mg total) by mouth as directed. Take one tablet 1 hour before any dental work including cleanings.   carvedilol  (COREG ) 3.125 MG tablet Take 1 tablet (3.125 mg total) by mouth 2 (two) times daily with a meal.   clobetasol cream (TEMOVATE) 0.05 % Apply 1 Application topically 2 (two) times daily as needed (rash).   desloratadine  (  CLARINEX ) 5 MG tablet TAKE 1 TABLET BY MOUTH DAILY AS NEEDED   esomeprazole  (NEXIUM ) 40 MG capsule TAKE ONE CAPSULE BY MOUTH EVERY MORNING   fluticasone  (FLONASE ) 50 MCG/ACT nasal spray SPRAY 1-2 SPRAYS INTO EACH NOSTRIL 1-7 times per week. (Patient taking differently: Place 1 spray into both nostrils daily as needed for allergies. SPRAY 1-2 SPRAYS INTO EACH NOSTRIL 1-7 times per week.)   Fluticasone  Furoate (ARNUITY ELLIPTA ) 200 MCG/ACT AEPB INHALE 1 PUFF BY MOUTH DAILY   Golimumab (SIMPONI ARIA IV) Inject 1 Dose into the vein every 2 (two) months.   ibuprofen (ADVIL) 200 MG tablet Take 200 mg by mouth every 6 (six) hours as needed for moderate pain (pain score 4-6).   nitroGLYCERIN  (NITROSTAT ) 0.4 MG SL tablet Place 1 tablet (0.4 mg total) under the tongue every 5 (five) minutes as needed for chest pain.   olmesartan-hydrochlorothiazide (BENICAR HCT) 40-25 MG tablet Take 1 tablet by mouth  in the morning.   rosuvastatin  (CRESTOR ) 20 MG tablet Take 1 tablet (20 mg total) by mouth daily.   sertraline  (ZOLOFT ) 100 MG tablet Take 50 mg by mouth at bedtime.   triamcinolone cream (KENALOG) 0.1 % Apply 1 Application topically 2 (two) times daily as needed (skin irritation).   [DISCONTINUED] amLODipine  (NORVASC ) 5 MG tablet Take 5 mg by mouth daily.      ROS:   Please see the history of present illness.    All other systems reviewed and are negative.  EKGs   EKG Interpretation Date/Time:  Monday February 04 2024 11:14:17 EDT Ventricular Rate:  62 PR Interval:  164 QRS Duration:  108 QT Interval:  428 QTC Calculation: 434 R Axis:   -3  Text Interpretation: Normal sinus rhythm Minimal voltage criteria for LVH, may be normal variant ( Cornell product ) Anterior infarct , age undetermined Confirmed by Sebastian Collar [id number]) on 02/04/2024 11:28:03 AM   Risk Assessment/Calculations:           Physical Exam:    VS:  BP 122/72   Pulse 62   Ht 5' 1 (1.549 m)   Wt 160 lb 9.6 oz (72.8 kg)   SpO2 96%   BMI 30.35 kg/m     Wt Readings from Last 3 Encounters:  02/04/24 160 lb 9.6 oz (72.8 kg)  01/30/24 159 lb 13.3 oz (72.5 kg)  01/16/24 160 lb (72.6 kg)     GEN: Well nourished, well developed in no acute distress NECK: No JVD CARDIAC: RRR, no murmurs, rubs, gallops RESPIRATORY:  Clear to auscultation without rales, wheezing or rhonchi  ABDOMEN: Soft, non-tender, non-distended EXTREMITIES:  No edema; No deformity.  Groin sites clear without hematoma or ecchymosis. Some  mild oozing at groin sites. Small skin tear from where I removed a bandage, Band-Aid applied.  ASSESSMENT:    1. S/P TAVR (transcatheter aortic valve replacement)   2. Essential hypertension   3. Coronary artery disease involving native coronary artery of native heart without angina pectoris     PLAN:    In order of problems listed above:  Severe AS s/p TAVR:  -- Pt doing great s/p TAVR.  --  ECG with no HAVB.  -- Groin sites healing well.  -- SBE prophylaxis discussed; I have RX'd azithromycin  due to a PCN allergy. .  -- Continue Aspirin  81mg  daily. -- Cleared to resume all activities without restriction. -- Encouraged CRP2 participation.  -- I will see back for 1 month echo and OV.  HTN: -- BP well  controlled today.122/72 -- Continue Coreg  3.125mg  BID & Benicar HCT 40-25mg  daily.  -- Amlodipine  5mg  daily held at discharge given normotensive BPs- will keep her off this for now.  CAD: -- [facility] 11/26/23 showed moderate nonobstructive coronary disease with a 50% proximal RCA stenosis.  -- Continue Asprin 81mg  daily.  -- Continue Crestor  20mg  daily.       Cardiac Rehabilitation Eligibility Assessment  The patient is ready to start cardiac rehabilitation from a cardiac standpoint.        Medication Adjustments/Labs and Tests Ordered: Current medicines are reviewed at length with the patient today.  Concerns regarding medicines are outlined above.  Orders Placed This Encounter  Procedures   EKG 12-Lead   Meds ordered this encounter  Medications   azithromycin  (ZITHROMAX ) 500 MG tablet    Sig: Take 1 tablet (500 mg total) by mouth as directed. Take one tablet 1 hour before any dental work including cleanings.    Dispense:  6 tablet    Refill:  12    Supervising Provider:   WONDA SHARPER [[id number]]    Patient Instructions  Medication Instructions:  Your physician has recommended you make the following change in your medication: Start Azithromycin  500mg , take 1 tablet by mouth 1 hour prior to dental procedures and cleanings.   Stop Amlodipine . *If you need a refill on your cardiac medications before your next appointment, please call your pharmacy*  Lab Work: None needed If you have labs (blood work) drawn today and your tests are completely normal, you will receive your results only by: MyChart Message (if you have MyChart) OR A paper copy in the mail If you  have any lab test that is abnormal or we need to change your treatment, we will call you to review the results.  Testing/Procedures: Scheduled for 03/20/25 at 2:05p Your physician has requested that you have an echocardiogram. Echocardiography is a painless test that uses sound waves to create images of your heart. It provides your doctor with information about the size and shape of your heart and how well your heart's chambers and valves are working. This procedure takes approximately one hour. There are no restrictions for this procedure. Please do NOT wear cologne, perfume, aftershave, or lotions (deodorant is allowed). Please arrive 15 minutes prior to your appointment time.  Please note: We ask at that you not bring children with you during ultrasound (echo/ vascular) testing. Due to room size and safety concerns, children are not allowed in the ultrasound rooms during exams. Our front office staff cannot provide observation of children in our lobby area while testing is being conducted. An adult accompanying a patient to their appointment will only be allowed in the ultrasound room at the discretion of the ultrasound technician under special circumstances. We apologize for any inconvenience.   Follow-Up: At [facility], you and your health needs are our priority.  As part of our continuing mission to provide you with exceptional heart care, our providers are all part of one team.  This team includes your primary Cardiologist (physician) and Advanced Practice Providers or APPs (Physician Assistants and Nurse Practitioners) who all work together to provide you with the care you need, when you need it.  Your next appointment:   As scheduled for 03/20/25 at 3:10p  Provider:   Izetta Hummer, PA-C    We recommend signing up for the patient portal called MyChart.  Sign up information is provided on this After Visit Summary.  MyChart is used to connect  with patients for Virtual Visits  (Telemedicine).  Patients are able to view lab/test results, encounter notes, upcoming appointments, etc.  Non-urgent messages can be sent to your provider as well.   To learn more about what you can do with MyChart, go to [email or web address].     Signed, Lamarr Hummer, PA-C  02/05/2024 3:00 AM    [facility]

## 2024-02-04 ENCOUNTER — Other Ambulatory Visit: Payer: Self-pay | Admitting: Allergy and Immunology

## 2024-02-04 ENCOUNTER — Ambulatory Visit: Admitting: Cardiology

## 2024-02-04 ENCOUNTER — Ambulatory Visit: Attending: Physician Assistant | Admitting: Physician Assistant

## 2024-02-04 VITALS — BP 122/72 | HR 62 | Ht 61.0 in | Wt 160.6 lb

## 2024-02-04 DIAGNOSIS — I10 Essential (primary) hypertension: Secondary | ICD-10-CM

## 2024-02-04 DIAGNOSIS — Z952 Presence of prosthetic heart valve: Secondary | ICD-10-CM

## 2024-02-04 DIAGNOSIS — I2510 Atherosclerotic heart disease of native coronary artery without angina pectoris: Secondary | ICD-10-CM

## 2024-02-04 DIAGNOSIS — R5082 Postprocedural fever: Secondary | ICD-10-CM

## 2024-02-04 MED ORDER — AZITHROMYCIN 500 MG PO TABS
500.0000 mg | ORAL_TABLET | ORAL | 12 refills | Status: DC
Start: 2024-02-04 — End: 2024-05-20

## 2024-02-04 NOTE — Patient Instructions (Signed)
 Medication Instructions:  Your physician has recommended you make the following change in your medication: Start Azithromycin  500mg , take 1 tablet by mouth 1 hour prior to dental procedures and cleanings.   Stop Amlodipine . *If you need a refill on your cardiac medications before your next appointment, please call your pharmacy*  Lab Work: None needed If you have labs (blood work) drawn today and your tests are completely normal, you will receive your results only by: MyChart Message (if you have MyChart) OR A paper copy in the mail If you have any lab test that is abnormal or we need to change your treatment, we will call you to review the results.  Testing/Procedures: Scheduled for 03/20/25 at 2:05p Your physician has requested that you have an echocardiogram. Echocardiography is a painless test that uses sound waves to create images of your heart. It provides your doctor with information about the size and shape of your heart and how well your heart's chambers and valves are working. This procedure takes approximately one hour. There are no restrictions for this procedure. Please do NOT wear cologne, perfume, aftershave, or lotions (deodorant is allowed). Please arrive 15 minutes prior to your appointment time.  Please note: We ask at that you not bring children with you during ultrasound (echo/ vascular) testing. Due to room size and safety concerns, children are not allowed in the ultrasound rooms during exams. Our front office staff cannot provide observation of children in our lobby area while testing is being conducted. An adult accompanying a patient to their appointment will only be allowed in the ultrasound room at the discretion of the ultrasound technician under special circumstances. We apologize for any inconvenience.   Follow-Up: At [facility], you and your health needs are our priority.  As part of our continuing mission to provide you with exceptional heart care, our  providers are all part of one team.  This team includes your primary Cardiologist (physician) and Advanced Practice Providers or APPs (Physician Assistants and Nurse Practitioners) who all work together to provide you with the care you need, when you need it.  Your next appointment:   As scheduled for 03/20/25 at 3:10p  Provider:   Izetta Hummer, PA-C    We recommend signing up for the patient portal called MyChart.  Sign up information is provided on this After Visit Summary.  MyChart is used to connect with patients for Virtual Visits (Telemedicine).  Patients are able to view lab/test results, encounter notes, upcoming appointments, etc.  Non-urgent messages can be sent to your provider as well.   To learn more about what you can do with MyChart, go to [email or web address].

## 2024-02-07 ENCOUNTER — Ambulatory Visit: Admitting: Physician Assistant

## 2024-02-13 ENCOUNTER — Telehealth (HOSPITAL_COMMUNITY): Payer: Self-pay

## 2024-02-13 NOTE — Telephone Encounter (Signed)
 Patient called back to get scheduled in the Cardiac Rehab Program. Patient will come in for orientation on 07/29 and will attend the 11:45 exercise class.  Sent MyChart message.

## 2024-02-13 NOTE — Telephone Encounter (Signed)
 Attempted to call patient regarding cardiac rehab- no answer, left message to call us  back. Sent MyChart message.

## 2024-02-13 NOTE — Telephone Encounter (Signed)
 Pt insurance is active and benefits verified through Medicare B. Co-pay $0, DED $257/$257 met, out of pocket $0/$0 met, co-insurance 0%. No pre-authorization required. [employer]port, 02/13/2024 @ 11:08am, REF# [id number].  How many CR sessions are covered? (36 visits for TCR, 72 visits for ICR)72 ICR Is this a lifetime maximum or an annual maximum? Annual Has the member used any of these services to date? No Is there a time limit (weeks/months) on start of program and/or program completion? No  2ndary insurance is active and benefits verified through [employer] Supplement. Co-pay $0, DED $0/$0 met, out of pocket $0/$0 met, co-insurance 0%. No pre-authorization required.

## 2024-02-26 ENCOUNTER — Encounter (HOSPITAL_COMMUNITY)
Admission: RE | Admit: 2024-02-26 | Discharge: 2024-02-26 | Disposition: A | Discharge status: Home / Self Care | Source: Ambulatory Visit | Attending: Cardiology | Admitting: Cardiology

## 2024-02-26 ENCOUNTER — Encounter (HOSPITAL_COMMUNITY): Payer: Self-pay

## 2024-02-26 VITALS — BP 138/76 | HR 62 | Ht 60.5 in | Wt 164.0 lb

## 2024-02-26 DIAGNOSIS — Z952 Presence of prosthetic heart valve: Secondary | ICD-10-CM | POA: Diagnosis present

## 2024-02-26 NOTE — Progress Notes (Signed)
 Cardiac Individual Treatment Plan  Patient Details  Name: Taylor Stanley MRN: [id number] Date of Birth: March 18, 1956 Referring Provider:   Flowsheet Row INTENSIVE CARDIAC REHAB ORIENT from 02/26/2024 in [facility]  Referring Provider Newman Lawrence, MD    Initial Encounter Date:  Flowsheet Row INTENSIVE CARDIAC REHAB ORIENT from 02/26/2024 in [facility], [facility]  Date 02/26/24    Visit Diagnosis: 01/29/24 S/P TAVR  Patient's Home Medications on Admission:  Current Outpatient Medications:    albuterol  (VENTOLIN  HFA) 108 (90 Base) MCG/ACT inhaler, TAKE 2 PUFFS BY MOUTH EVERY 4 HOURS AS NEEDED, Disp: 18 g, Rfl: 2   ascorbic acid (VITAMIN C) 500 MG tablet, Take 1,000 mg by mouth daily., Disp: , Rfl:    aspirin  EC 81 MG tablet, Take 1 tablet (81 mg total) by mouth daily. Swallow whole., Disp: 90 tablet, Rfl: 2   azithromycin  (ZITHROMAX ) 500 MG tablet, Take 1 tablet (500 mg total) by mouth as directed. Take one tablet 1 hour before any dental work including cleanings., Disp: 6 tablet, Rfl: 12   carvedilol  (COREG ) 3.125 MG tablet, Take 1 tablet (3.125 mg total) by mouth 2 (two) times daily with a meal., Disp: 180 tablet, Rfl: 2   cholecalciferol (VITAMIN D) 1000 UNITS tablet, Take 1,000 Units by mouth in the morning., Disp: , Rfl:    clobetasol cream (TEMOVATE) 0.05 %, Apply 1 Application topically 2 (two) times daily as needed (rash)., Disp: , Rfl:    desloratadine  (CLARINEX ) 5 MG tablet, TAKE 1 TABLET BY MOUTH DAILY AS NEEDED, Disp: 90 tablet, Rfl: 1   esomeprazole  (NEXIUM ) 40 MG capsule, TAKE ONE CAPSULE BY MOUTH EVERY MORNING, Disp: 90 capsule, Rfl: 2   fluticasone  (FLONASE ) 50 MCG/ACT nasal spray, SPRAY 1-2 SPRAYS INTO EACH NOSTRIL 1-7 times per week., Disp: 16 mL, Rfl: 5   Fluticasone  Furoate (ARNUITY ELLIPTA ) 200 MCG/ACT AEPB, INHALE 1 PUFF BY MOUTH DAILY, Disp: 30 each, Rfl:  11   Golimumab (SIMPONI ARIA IV), Inject 1 Dose into the vein every 2 (two) months., Disp: , Rfl:    nitroGLYCERIN  (NITROSTAT ) 0.4 MG SL tablet, Place 1 tablet (0.4 mg total) under the tongue every 5 (five) minutes as needed for chest pain., Disp: 25 tablet, Rfl: 4   olmesartan-hydrochlorothiazide (BENICAR HCT) 40-25 MG tablet, Take 1 tablet by mouth in the morning., Disp: , Rfl:    rosuvastatin  (CRESTOR ) 20 MG tablet, Take 1 tablet (20 mg total) by mouth daily., Disp: 90 tablet, Rfl: 2   sertraline  (ZOLOFT ) 100 MG tablet, Take 50 mg by mouth at bedtime., Disp: , Rfl:    triamcinolone cream (KENALOG) 0.1 %, Apply 1 Application topically 2 (two) times daily as needed (skin irritation)., Disp: , Rfl:    ibuprofen (ADVIL) 200 MG tablet, Take 200 mg by mouth every 6 (six) hours as needed for moderate pain (pain score 4-6). (Patient not taking: Reported on 02/26/2024), Disp: , Rfl:   Past Medical History: Past Medical History:  Diagnosis Date   Anxiety    Asthma    triggered by allergies; does not use inhaler daily   Carpal tunnel syndrome of right wrist 11/2011   Dental crowns present    Depression    GERD (gastroesophageal reflux disease)    Hx of colonic polyps    Hypertension    under control; has been on med. x 5 yrs.   Osteopenia    PONV (postoperative  nausea and vomiting)    S/P TAVR (transcatheter aortic valve replacement) 01/29/2024   s/p TAVR with a 23 mm Edwards S3UR via the TF approach by Dr. Wonda and Dr. Lucas   Seasonal allergies    Seizure [facility]) as an infant   x 1 - unknown cause   Snores    denies apnea   TMJ disease    Wears glasses     Tobacco Use: Social History   Tobacco Use  Smoking Status Former   Current packs/day: 0.00   Average packs/day: 1 pack/day for 13.0 years (13.0 ttl pk-yrs)   Types: Cigarettes   Start date: 02/11/1992   Quit date: 02/10/2005   Years since quitting: 19.0  Smokeless Tobacco Never    Labs: Review Flowsheet  More data exists       Latest Ref Rng & Units 06/23/2013 02/22/2022 10/20/2022 11/26/2023 01/29/2024  Labs for ITP Cardiac and Pulmonary Rehab  Cholestrol 100 - 199 mg/dL 839  94  889  - -  LDL (calc) 0 - 99 mg/dL 892  41  57  - -  HDL-C >39 mg/dL 69.69  33  36  - -  Trlycerides 0 - 149 mg/dL 886.9  895  86  - -  PH, Arterial 7.35 - 7.45 - - - 7.418  -  PCO2 arterial 32 - 48 mmHg - - - 40.2  -  Bicarbonate 20.0 - 28.0 mmol/L - - - 27.7  25.9  28.2  -  TCO2 22 - 32 mmol/L - - - 29  27  30  26    O2 Saturation % - - - 76  96  75  -    Details       Multiple values from one day are sorted in reverse-chronological order         Capillary Blood Glucose: No results found for: GLUCAP   Exercise Target Goals: Exercise Program Goal: Individual exercise prescription set using results from initial 6 min walk test and THRR while considering  patient's activity barriers and safety.   Exercise Prescription Goal: Initial exercise prescription builds to 30-45 minutes a day of aerobic activity, 2-3 days per week.  Home exercise guidelines will be given to patient during program as part of exercise prescription that the participant will acknowledge.  Activity Barriers & Risk Stratification:  Activity Barriers & Cardiac Risk Stratification - 02/26/24 1311       Activity Barriers & Cardiac Risk Stratification   Activity Barriers Arthritis;Joint Problems;Balance Concerns;Shortness of Breath;Neck/Spine Problems;Back Problems    Cardiac Risk Stratification High          6 Minute Walk:  6 Minute Walk     Row Name 02/26/24 1310         6 Minute Walk   Phase Initial     Distance 1514 feet     Walk Time 6 minutes     # of Rest Breaks 0     MPH 2.9     METS 2.9     RPE 7     Perceived Dyspnea  1     VO2 Peak 10.2     Symptoms Yes (comment)     Comments Mild SOB, RPD = 1, Chronic left hip pain 2/10     Resting HR 62 bpm     Resting BP 138/76     Resting Oxygen Saturation  95 %     Exercise Oxygen  Saturation  during 6 min walk 96 %  Max Ex. HR 82 bpm     Max Ex. BP 152/80     2 Minute Post BP 130/80        Oxygen Initial Assessment:   Oxygen Re-Evaluation:   Oxygen Discharge (Final Oxygen Re-Evaluation):   Initial Exercise Prescription:  Initial Exercise Prescription - 02/26/24 1300       Date of Initial Exercise RX and Referring Provider   Date 02/26/24    Referring Provider Newman Lawrence, MD    Expected Discharge Date 05/14/24      NuStep   Level 2    SPM 75    Minutes 15    METs 2.9      Recumbant Elliptical   Level 2    RPM 60    Watts 75    Minutes 15    METs 2.9      Prescription Details   Frequency (times per week) 3    Duration Progress to 30 minutes of continuous aerobic without signs/symptoms of physical distress      Intensity   THRR 40-80% of Max Heartrate 60-120    Ratings of Perceived Exertion 11-13    Perceived Dyspnea 0-4      Progression   Progression Continue progressive overload as per policy without signs/symptoms or physical distress.      Resistance Training   Training Prescription Yes    Weight 3 lbs    Reps 10-15          Perform Capillary Blood Glucose checks as needed.  Exercise Prescription Changes:   Exercise Comments:   Exercise Goals and Review:   Exercise Goals     Row Name 02/26/24 1314             Exercise Goals   Increase Physical Activity Yes       Intervention Provide advice, education, support and counseling about physical activity/exercise needs.;Develop an individualized exercise prescription for aerobic and resistive training based on initial evaluation findings, risk stratification, comorbidities and participant's personal goals.       Expected Outcomes Long Term: Exercising regularly at least 3-5 days a week.;Short Term: Attend rehab on a regular basis to increase amount of physical activity.;Long Term: Add in home exercise to make exercise part of routine and to increase amount of  physical activity.       Increase Strength and Stamina Yes       Intervention Provide advice, education, support and counseling about physical activity/exercise needs.;Develop an individualized exercise prescription for aerobic and resistive training based on initial evaluation findings, risk stratification, comorbidities and participant's personal goals.       Expected Outcomes Short Term: Increase workloads from initial exercise prescription for resistance, speed, and METs.;Short Term: Perform resistance training exercises routinely during rehab and add in resistance training at home;Long Term: Improve cardiorespiratory fitness, muscular endurance and strength as measured by increased METs and functional capacity ( )       Able to understand and use rate of perceived exertion (RPE) scale Yes       Intervention Provide education and explanation on how to use RPE scale       Expected Outcomes Short Term: Able to use RPE daily in rehab to express subjective intensity level;Long Term:  Able to use RPE to guide intensity level when exercising independently       Knowledge and understanding of Target Heart Rate Range (THRR) Yes       Intervention Provide education and explanation of THRR including how the numbers  were predicted and where they are located for reference       Expected Outcomes Long Term: Able to use THRR to govern intensity when exercising independently;Short Term: Able to use daily as guideline for intensity in rehab;Short Term: Able to state/look up THRR       Understanding of Exercise Prescription Yes       Intervention Provide education, explanation, and written materials on patient's individual exercise prescription       Expected Outcomes Short Term: Able to explain program exercise prescription;Long Term: Able to explain home exercise prescription to exercise independently          Exercise Goals Re-Evaluation :   Discharge Exercise Prescription (Final Exercise Prescription  Changes):   Nutrition:  Target Goals: Understanding of nutrition guidelines, daily intake of sodium 1500mg , cholesterol 200mg , calories 30% from fat and 7% or less from saturated fats, daily to have 5 or more servings of fruits and vegetables.  Biometrics:  Pre Biometrics - 02/26/24 0937       Pre Biometrics   Waist Circumference 39.5 inches    Hip Circumference 43 inches    Waist to Hip Ratio 0.92 %    Triceps Skinfold 30 mm    % Body Fat 43.8 %    Grip Strength 14 kg    Flexibility 14.25 in    Single Leg Stand 4.18 seconds           Nutrition Therapy Plan and Nutrition Goals:   Nutrition Assessments:  MEDIFICTS Score Key: >=70 Need to make dietary changes  40-70 Heart Healthy Diet <= 40 Therapeutic Level Cholesterol Diet    Picture Your Plate Scores: <59 Unhealthy dietary pattern with much room for improvement. 41-50 Dietary pattern unlikely to meet recommendations for good health and room for improvement. 51-60 More healthful dietary pattern, with some room for improvement.  >60 Healthy dietary pattern, although there may be some specific behaviors that could be improved.    Nutrition Goals Re-Evaluation:   Nutrition Goals Re-Evaluation:   Nutrition Goals Discharge (Final Nutrition Goals Re-Evaluation):   Psychosocial: Target Goals: Acknowledge presence or absence of significant depression and/or stress, maximize coping skills, provide positive support system. Participant is able to verbalize types and ability to use techniques and skills needed for reducing stress and depression.  Initial Review & Psychosocial Screening:  Initial Psych Review & Screening - 02/26/24 1053       Initial Review   Current issues with Current Anxiety/Panic      Family Dynamics   Good Support System? Yes   Maurine has her husband and two children and a grandson brother and sister for support.     Barriers   Psychosocial barriers to participate in program There are no  identifiable barriers or psychosocial needs.      Screening Interventions   Interventions Encouraged to exercise          Quality of Life Scores:  Quality of Life - 02/26/24 1150       Quality of Life   Select Quality of Life      Quality of Life Scores   Health/Function Pre 25.2 %    Socioeconomic Pre 27.14 %    Psych/Spiritual Pre 28.93 %    Family Pre 28 %    GLOBAL Pre 26.78 %         Scores of 19 and below usually indicate a poorer quality of life in these areas.  A difference of  2-3 points is a clinically  meaningful difference.  A difference of 2-3 points in the total score of the Quality of Life Index has been associated with significant improvement in overall quality of life, self-image, physical symptoms, and general health in studies assessing change in quality of life.  PHQ-9: Review Flowsheet       02/26/2024 07/21/2013  Depression screen PHQ 2/9  Decreased Interest 0 0  Down, Depressed, Hopeless 0 0  PHQ - 2 Score 0 0  Altered sleeping 0 -  Tired, decreased energy 1 -  Change in appetite 0 -  Feeling bad or failure about yourself  0 -  Trouble concentrating 0 -  Moving slowly or fidgety/restless 0 -  Suicidal thoughts 0 -  PHQ-9 Score 1 -  Difficult doing work/chores Not difficult at all -   Interpretation of Total Score  Total Score Depression Severity:  1-4 = Minimal depression, 5-9 = Mild depression, 10-14 = Moderate depression, 15-19 = Moderately severe depression, 20-27 = Severe depression   Psychosocial Evaluation and Intervention:   Psychosocial Re-Evaluation:   Psychosocial Discharge (Final Psychosocial Re-Evaluation):   Vocational Rehabilitation: Provide vocational rehab assistance to qualifying candidates.   Vocational Rehab Evaluation & Intervention:  Vocational Rehab - 02/26/24 1054       Initial Vocational Rehab Evaluation & Intervention   Assessment shows need for Vocational Rehabilitation No   [name] is retired and does not  need vocational rehab at this time         Education: Education Goals: Education classes will be provided on a weekly basis, covering required topics. Participant will state understanding/return demonstration of topics presented.     Core Videos: Exercise    Move It!  Clinical staff conducted group or individual video education with verbal and written material and guidebook.  Patient learns the recommended Pritikin exercise program. Exercise with the goal of living a long, healthy life. Some of the health benefits of exercise include controlled diabetes, healthier blood pressure levels, improved cholesterol levels, improved heart and lung capacity, improved sleep, and better body composition. Everyone should speak with their doctor before starting or changing an exercise routine.  Biomechanical Limitations Clinical staff conducted group or individual video education with verbal and written material and guidebook.  Patient learns how biomechanical limitations can impact exercise and how we can mitigate and possibly overcome limitations to have an impactful and balanced exercise routine.  Body Composition Clinical staff conducted group or individual video education with verbal and written material and guidebook.  Patient learns that body composition (ratio of muscle mass to fat mass) is a key component to assessing overall fitness, rather than body weight alone. Increased fat mass, especially visceral belly fat, can put us  at increased risk for metabolic syndrome, type 2 diabetes, heart disease, and even death. It is recommended to combine diet and exercise (cardiovascular and resistance training) to improve your body composition. Seek guidance from your physician and exercise physiologist before implementing an exercise routine.  Exercise Action Plan Clinical staff conducted group or individual video education with verbal and written material and guidebook.  Patient learns the recommended  strategies to achieve and enjoy long-term exercise adherence, including variety, self-motivation, self-efficacy, and positive decision making. Benefits of exercise include fitness, good health, weight management, more energy, better sleep, less stress, and overall well-being.  Medical   Heart Disease Risk Reduction Clinical staff conducted group or individual video education with verbal and written material and guidebook.  Patient learns our heart is our most vital organ as it  circulates oxygen, nutrients, white blood cells, and hormones throughout the entire body, and carries waste away. Data supports a plant-based eating plan like the Pritikin Program for its effectiveness in slowing progression of and reversing heart disease. The video provides a number of recommendations to address heart disease.   Metabolic Syndrome and Belly Fat  Clinical staff conducted group or individual video education with verbal and written material and guidebook.  Patient learns what metabolic syndrome is, how it leads to heart disease, and how one can reverse it and keep it from coming back. You have metabolic syndrome if you have 3 of the following 5 criteria: abdominal obesity, high blood pressure, high triglycerides, low HDL cholesterol, and high blood sugar.  Hypertension and Heart Disease Clinical staff conducted group or individual video education with verbal and written material and guidebook.  Patient learns that high blood pressure, or hypertension, is very common in the [place] . Hypertension is largely due to excessive salt intake, but other important risk factors include being overweight, physical inactivity, drinking too much alcohol, smoking, and not eating enough potassium from fruits and vegetables. High blood pressure is a leading risk factor for heart attack, stroke, congestive heart failure, dementia, kidney failure, and premature death. Long-term effects of excessive salt intake include stiffening  of the arteries and thickening of heart muscle and organ damage. Recommendations include ways to reduce hypertension and the risk of heart disease.  Diseases of Our Time - Focusing on Diabetes Clinical staff conducted group or individual video education with verbal and written material and guidebook.  Patient learns why the best way to stop diseases of our time is prevention, through food and other lifestyle changes. Medicine (such as prescription pills and surgeries) is often only a Band-Aid on the problem, not a long-term solution. Most common diseases of our time include obesity, type 2 diabetes, hypertension, heart disease, and cancer. The Pritikin Program is recommended and has been proven to help reduce, reverse, and/or prevent the damaging effects of metabolic syndrome.  Nutrition   Overview of the Pritikin Eating Plan  Clinical staff conducted group or individual video education with verbal and written material and guidebook.  Patient learns about the Pritikin Eating Plan for disease risk reduction. The Pritikin Eating Plan emphasizes a wide variety of unrefined, minimally-processed carbohydrates, like fruits, vegetables, whole grains, and legumes. Go, Caution, and Stop food choices are explained. Plant-based and lean animal proteins are emphasized. Rationale provided for low sodium intake for blood pressure control, low added sugars for blood sugar stabilization, and low added fats and oils for coronary artery disease risk reduction and weight management.  Calorie Density  Clinical staff conducted group or individual video education with verbal and written material and guidebook.  Patient learns about calorie density and how it impacts the Pritikin Eating Plan. Knowing the characteristics of the food you choose will help you decide whether those foods will lead to weight gain or weight loss, and whether you want to consume more or less of them. Weight loss is usually a side effect of the  Pritikin Eating Plan because of its focus on low calorie-dense foods.  Label Reading  Clinical staff conducted group or individual video education with verbal and written material and guidebook.  Patient learns about the Pritikin recommended label reading guidelines and corresponding recommendations regarding calorie density, added sugars, sodium content, and whole grains.  Dining Out - Part 1  Clinical staff conducted group or individual video education with verbal and written material and  guidebook.  Patient learns that restaurant meals can be sabotaging because they can be so high in calories, fat, sodium, and/or sugar. Patient learns recommended strategies on how to positively address this and avoid unhealthy pitfalls.  Facts on Fats  Clinical staff conducted group or individual video education with verbal and written material and guidebook.  Patient learns that lifestyle modifications can be just as effective, if not more so, as many medications for lowering your risk of heart disease. A Pritikin lifestyle can help to reduce your risk of inflammation and atherosclerosis (cholesterol build-up, or plaque, in the artery walls). Lifestyle interventions such as dietary choices and physical activity address the cause of atherosclerosis. A review of the types of fats and their impact on blood cholesterol levels, along with dietary recommendations to reduce fat intake is also included.  Nutrition Action Plan  Clinical staff conducted group or individual video education with verbal and written material and guidebook.  Patient learns how to incorporate Pritikin recommendations into their lifestyle. Recommendations include planning and keeping personal health goals in mind as an important part of their success.  Healthy Mind-Set    Healthy Minds, Bodies, Hearts  Clinical staff conducted group or individual video education with verbal and written material and guidebook.  Patient learns how to identify  when they are stressed. Video will discuss the impact of that stress, as well as the many benefits of stress management. Patient will also be introduced to stress management techniques. The way we think, act, and feel has an impact on our hearts.  How Our Thoughts Can Heal Our Hearts  Clinical staff conducted group or individual video education with verbal and written material and guidebook.  Patient learns that negative thoughts can cause depression and anxiety. This can result in negative lifestyle behavior and serious health problems. Cognitive behavioral therapy is an effective method to help control our thoughts in order to change and improve our emotional outlook.  Additional Videos:  Exercise    Improving Performance  Clinical staff conducted group or individual video education with verbal and written material and guidebook.  Patient learns to use a non-linear approach by alternating intensity levels and lengths of time spent exercising to help burn more calories and lose more body fat. Cardiovascular exercise helps improve heart health, metabolism, hormonal balance, blood sugar control, and recovery from fatigue. Resistance training improves strength, endurance, balance, coordination, reaction time, metabolism, and muscle mass. Flexibility exercise improves circulation, posture, and balance. Seek guidance from your physician and exercise physiologist before implementing an exercise routine and learn your capabilities and proper form for all exercise.  Introduction to Yoga  Clinical staff conducted group or individual video education with verbal and written material and guidebook.  Patient learns about yoga, a discipline of the coming together of mind, breath, and body. The benefits of yoga include improved flexibility, improved range of motion, better posture and core strength, increased lung function, weight loss, and positive self-image. Yoga's heart health benefits include lowered blood  pressure, healthier heart rate, decreased cholesterol and triglyceride levels, improved immune function, and reduced stress. Seek guidance from your physician and exercise physiologist before implementing an exercise routine and learn your capabilities and proper form for all exercise.  Medical   Aging: Enhancing Your Quality of Life  Clinical staff conducted group or individual video education with verbal and written material and guidebook.  Patient learns key strategies and recommendations to stay in good physical health and enhance quality of life, such as prevention strategies, having  an advocate, securing a Health Care Proxy and Power of Attorney, and keeping a list of medications and system for tracking them. It also discusses how to avoid risk for bone loss.  Biology of Weight Control  Clinical staff conducted group or individual video education with verbal and written material and guidebook.  Patient learns that weight gain occurs because we consume more calories than we burn (eating more, moving less). Even if your body weight is normal, you may have higher ratios of fat compared to muscle mass. Too much body fat puts you at increased risk for cardiovascular disease, heart attack, stroke, type 2 diabetes, and obesity-related cancers. In addition to exercise, following the Pritikin Eating Plan can help reduce your risk.  Decoding Lab Results  Clinical staff conducted group or individual video education with verbal and written material and guidebook.  Patient learns that lab test reflects one measurement whose values change over time and are influenced by many factors, including medication, stress, sleep, exercise, food, hydration, pre-existing medical conditions, and more. It is recommended to use the knowledge from this video to become more involved with your lab results and evaluate your numbers to speak with your doctor.   Diseases of Our Time - Overview  Clinical staff conducted group or  individual video education with verbal and written material and guidebook.  Patient learns that according to the CDC, 50% to 70% of chronic diseases (such as obesity, type 2 diabetes, elevated lipids, hypertension, and heart disease) are avoidable through lifestyle improvements including healthier food choices, listening to satiety cues, and increased physical activity.  Sleep Disorders Clinical staff conducted group or individual video education with verbal and written material and guidebook.  Patient learns how good quality and duration of sleep are important to overall health and well-being. Patient also learns about sleep disorders and how they impact health along with recommendations to address them, including discussing with a physician.  Nutrition  Dining Out - Part 2 Clinical staff conducted group or individual video education with verbal and written material and guidebook.  Patient learns how to plan ahead and communicate in order to maximize their dining experience in a healthy and nutritious manner. Included are recommended food choices based on the type of restaurant the patient is visiting.   Fueling a Banker conducted group or individual video education with verbal and written material and guidebook.  There is a strong connection between our food choices and our health. Diseases like obesity and type 2 diabetes are very prevalent and are in large-part due to lifestyle choices. The Pritikin Eating Plan provides plenty of food and hunger-curbing satisfaction. It is easy to follow, affordable, and helps reduce health risks.  Menu Workshop  Clinical staff conducted group or individual video education with verbal and written material and guidebook.  Patient learns that restaurant meals can sabotage health goals because they are often packed with calories, fat, sodium, and sugar. Recommendations include strategies to plan ahead and to communicate with the manager,  chef, or server to help order a healthier meal.  Planning Your Eating Strategy  Clinical staff conducted group or individual video education with verbal and written material and guidebook.  Patient learns about the Pritikin Eating Plan and its benefit of reducing the risk of disease. The Pritikin Eating Plan does not focus on calories. Instead, it emphasizes high-quality, nutrient-rich foods. By knowing the characteristics of the foods, we choose, we can determine their calorie density and make informed decisions.  Targeting  Your Nutrition Priorities  Clinical staff conducted group or individual video education with verbal and written material and guidebook.  Patient learns that lifestyle habits have a tremendous impact on disease risk and progression. This video provides eating and physical activity recommendations based on your personal health goals, such as reducing LDL cholesterol, losing weight, preventing or controlling type 2 diabetes, and reducing high blood pressure.  Vitamins and Minerals  Clinical staff conducted group or individual video education with verbal and written material and guidebook.  Patient learns different ways to obtain key vitamins and minerals, including through a recommended healthy diet. It is important to discuss all supplements you take with your doctor.   Healthy Mind-Set    Smoking Cessation  Clinical staff conducted group or individual video education with verbal and written material and guidebook.  Patient learns that cigarette smoking and tobacco addiction pose a serious health risk which affects millions of people. Stopping smoking will significantly reduce the risk of heart disease, lung disease, and many forms of cancer. Recommended strategies for quitting are covered, including working with your doctor to develop a successful plan.  Culinary   Becoming a Set designer conducted group or individual video education with verbal and written  material and guidebook.  Patient learns that cooking at home can be healthy, cost-effective, quick, and puts them in control. Keys to cooking healthy recipes will include looking at your recipe, assessing your equipment needs, planning ahead, making it simple, choosing cost-effective seasonal ingredients, and limiting the use of added fats, salts, and sugars.  Cooking - Breakfast and Snacks  Clinical staff conducted group or individual video education with verbal and written material and guidebook.  Patient learns how important breakfast is to satiety and nutrition through the entire day. Recommendations include key foods to eat during breakfast to help stabilize blood sugar levels and to prevent overeating at meals later in the day. Planning ahead is also a key component.  Cooking - Educational psychologist conducted group or individual video education with verbal and written material and guidebook.  Patient learns eating strategies to improve overall health, including an approach to cook more at home. Recommendations include thinking of animal protein as a side on your plate rather than center stage and focusing instead on lower calorie dense options like vegetables, fruits, whole grains, and plant-based proteins, such as beans. Making sauces in large quantities to freeze for later and leaving the skin on your vegetables are also recommended to maximize your experience.  Cooking - Healthy Salads and Dressing Clinical staff conducted group or individual video education with verbal and written material and guidebook.  Patient learns that vegetables, fruits, whole grains, and legumes are the foundations of the Pritikin Eating Plan. Recommendations include how to incorporate each of these in flavorful and healthy salads, and how to create homemade salad dressings. Proper handling of ingredients is also covered. Cooking - Soups and [employer] - Soups and Desserts Clinical staff conducted  group or individual video education with verbal and written material and guidebook.  Patient learns that Pritikin soups and desserts make for easy, nutritious, and delicious snacks and meal components that are low in sodium, fat, sugar, and calorie density, while high in vitamins, minerals, and filling fiber. Recommendations include simple and healthy ideas for soups and desserts.   Overview     The Pritikin Solution Program Overview Clinical staff conducted group or individual video education with verbal and written material and guidebook.  Patient learns that the results of the Pritikin Program have been documented in more than 100 articles published in peer-reviewed journals, and the benefits include reducing risk factors for (and, in some cases, even reversing) high cholesterol, high blood pressure, type 2 diabetes, obesity, and more! An overview of the three key pillars of the Pritikin Program will be covered: eating well, doing regular exercise, and having a healthy mind-set.  WORKSHOPS  Exercise: Exercise Basics: Building Your Action Plan Clinical staff led group instruction and group discussion with PowerPoint presentation and patient guidebook. To enhance the learning environment the use of posters, models and videos may be added. At the conclusion of this workshop, patients will comprehend the difference between physical activity and exercise, as well as the benefits of incorporating both, into their routine. Patients will understand the FITT (Frequency, Intensity, Time, and Type) principle and how to use it to build an exercise action plan. In addition, safety concerns and other considerations for exercise and cardiac rehab will be addressed by the presenter. The purpose of this lesson is to promote a comprehensive and effective weekly exercise routine in order to improve patients' overall level of fitness.   Managing Heart Disease: Your Path to a Healthier Heart Clinical staff led  group instruction and group discussion with PowerPoint presentation and patient guidebook. To enhance the learning environment the use of posters, models and videos may be added.At the conclusion of this workshop, patients will understand the anatomy and physiology of the heart. Additionally, they will understand how Pritikin's three pillars impact the risk factors, the progression, and the management of heart disease.  The purpose of this lesson is to provide a high-level overview of the heart, heart disease, and how the Pritikin lifestyle positively impacts risk factors.  Exercise Biomechanics Clinical staff led group instruction and group discussion with PowerPoint presentation and patient guidebook. To enhance the learning environment the use of posters, models and videos may be added. Patients will learn how the structural parts of their bodies function and how these functions impact their daily activities, movement, and exercise. Patients will learn how to promote a neutral spine, learn how to manage pain, and identify ways to improve their physical movement in order to promote healthy living. The purpose of this lesson is to expose patients to common physical limitations that impact physical activity. Participants will learn practical ways to adapt and manage aches and pains, and to minimize their effect on regular exercise. Patients will learn how to maintain good posture while sitting, walking, and lifting.  Balance Training and Fall Prevention  Clinical staff led group instruction and group discussion with PowerPoint presentation and patient guidebook. To enhance the learning environment the use of posters, models and videos may be added. At the conclusion of this workshop, patients will understand the importance of their sensorimotor skills (vision, proprioception, and the vestibular system) in maintaining their ability to balance as they age. Patients will apply a variety of balancing  exercises that are appropriate for their current level of function. Patients will understand the common causes for poor balance, possible solutions to these problems, and ways to modify their physical environment in order to minimize their fall risk. The purpose of this lesson is to teach patients about the importance of maintaining balance as they age and ways to minimize their risk of falling.  WORKSHOPS   Nutrition:  Fueling a Ship broker led group instruction and group discussion with PowerPoint presentation and patient guidebook. To enhance the learning  environment the use of posters, models and videos may be added. Patients will review the foundational principles of the Pritikin Eating Plan and understand what constitutes a serving size in each of the food groups. Patients will also learn Pritikin-friendly foods that are better choices when away from home and review make-ahead meal and snack options. Calorie density will be reviewed and applied to three nutrition priorities: weight maintenance, weight loss, and weight gain. The purpose of this lesson is to reinforce (in a group setting) the key concepts around what patients are recommended to eat and how to apply these guidelines when away from home by planning and selecting Pritikin-friendly options. Patients will understand how calorie density may be adjusted for different weight management goals.  Mindful Eating  Clinical staff led group instruction and group discussion with PowerPoint presentation and patient guidebook. To enhance the learning environment the use of posters, models and videos may be added. Patients will briefly review the concepts of the Pritikin Eating Plan and the importance of low-calorie dense foods. The concept of mindful eating will be introduced as well as the importance of paying attention to internal hunger signals. Triggers for non-hunger eating and techniques for dealing with triggers will be explored.  The purpose of this lesson is to provide patients with the opportunity to review the basic principles of the Pritikin Eating Plan, discuss the value of eating mindfully and how to measure internal cues of hunger and fullness using the Hunger Scale. Patients will also discuss reasons for non-hunger eating and learn strategies to use for controlling emotional eating.  Targeting Your Nutrition Priorities Clinical staff led group instruction and group discussion with PowerPoint presentation and patient guidebook. To enhance the learning environment the use of posters, models and videos may be added. Patients will learn how to determine their genetic susceptibility to disease by reviewing their family history. Patients will gain insight into the importance of diet as part of an overall healthy lifestyle in mitigating the impact of genetics and other environmental insults. The purpose of this lesson is to provide patients with the opportunity to assess their personal nutrition priorities by looking at their family history, their own health history and current risk factors. Patients will also be able to discuss ways of prioritizing and modifying the Pritikin Eating Plan for their highest risk areas  Menu  Clinical staff led group instruction and group discussion with PowerPoint presentation and patient guidebook. To enhance the learning environment the use of posters, models and videos may be added. Using menus brought in from [employer], or printed from [employer], patients will apply the Pritikin dining out guidelines that were presented in the [employer] video. Patients will also be able to practice these guidelines in a variety of provided scenarios. The purpose of this lesson is to provide patients with the opportunity to practice hands-on learning of the Pritikin Dining Out guidelines with actual menus and practice scenarios.  Label Reading Clinical staff led group instruction  and group discussion with PowerPoint presentation and patient guidebook. To enhance the learning environment the use of posters, models and videos may be added. Patients will review and discuss the Pritikin label reading guidelines presented in Pritikin's Label Reading Educational series video. Using fool labels brought in from local grocery stores and markets, patients will apply the label reading guidelines and determine if the packaged food meet the Pritikin guidelines. The purpose of this lesson is to provide patients with the opportunity to review, discuss, and practice  hands-on learning of the Pritikin Label Reading guidelines with actual packaged food labels. Cooking School  Pritikin's [employer] are designed to teach patients ways to prepare quick, simple, and affordable recipes at home. The importance of nutrition's role in chronic disease risk reduction is reflected in its emphasis in the overall Pritikin program. By learning how to prepare essential core Pritikin Eating Plan recipes, patients will increase control over what they eat; be able to customize the flavor of foods without the use of added salt, sugar, or fat; and improve the quality of the food they consume. By learning a set of core recipes which are easily assembled, quickly prepared, and affordable, patients are more likely to prepare more healthy foods at home. These workshops focus on convenient breakfasts, simple entres, side dishes, and desserts which can be prepared with minimal effort and are consistent with nutrition recommendations for cardiovascular risk reduction. Cooking [employer] are taught by a Armed forces logistics/support/administrative officer (RD) who has been trained by the [employer]. The chef or RD has a clear understanding of the importance of minimizing - if not completely eliminating - added fat, sugar, and sodium in recipes. Throughout the series of Cooking School Workshop sessions, patients will learn  about healthy ingredients and efficient methods of cooking to build confidence in their capability to prepare    Cooking School weekly topics:  Adding Flavor- Sodium-Free  Fast and Healthy Breakfasts  Powerhouse Plant-Based Proteins  Satisfying Salads and Dressings  Simple Sides and Sauces  International Cuisine-Spotlight on the [employer] Zones  Delicious Desserts  Savory Soups  [employer] - Meals in a Astronomer Appetizers and Snacks  Comforting Weekend Breakfasts  One-Pot Wonders   Fast Evening Meals  Landscape architect Your Pritikin Plate  WORKSHOPS   Healthy Mindset (Psychosocial):  Focused Goals, Sustainable Changes Clinical staff led group instruction and group discussion with PowerPoint presentation and patient guidebook. To enhance the learning environment the use of posters, models and videos may be added. Patients will be able to apply effective goal setting strategies to establish at least one personal goal, and then take consistent, meaningful action toward that goal. They will learn to identify common barriers to achieving personal goals and develop strategies to overcome them. Patients will also gain an understanding of how our mind-set can impact our ability to achieve goals and the importance of cultivating a positive and growth-oriented mind-set. The purpose of this lesson is to provide patients with a deeper understanding of how to set and achieve personal goals, as well as the tools and strategies needed to overcome common obstacles which may arise along the way.  From Head to Heart: The Power of a Healthy Outlook  Clinical staff led group instruction and group discussion with PowerPoint presentation and patient guidebook. To enhance the learning environment the use of posters, models and videos may be added. Patients will be able to recognize and describe the impact of emotions and mood on physical health. They will discover the importance of  self-care and explore self-care practices which may work for them. Patients will also learn how to utilize the 4 C's to cultivate a healthier outlook and better manage stress and challenges. The purpose of this lesson is to demonstrate to patients how a healthy outlook is an essential part of maintaining good health, especially as they continue their cardiac rehab journey.  Healthy Sleep for a Healthy Heart Clinical staff led group instruction and group discussion with PowerPoint  presentation and patient guidebook. To enhance the learning environment the use of posters, models and videos may be added. At the conclusion of this workshop, patients will be able to demonstrate knowledge of the importance of sleep to overall health, well-being, and quality of life. They will understand the symptoms of, and treatments for, common sleep disorders. Patients will also be able to identify daytime and nighttime behaviors which impact sleep, and they will be able to apply these tools to help manage sleep-related challenges. The purpose of this lesson is to provide patients with a general overview of sleep and outline the importance of quality sleep. Patients will learn about a few of the most common sleep disorders. Patients will also be introduced to the concept of "sleep hygiene," and discover ways to self-manage certain sleeping problems through simple daily behavior changes. Finally, the workshop will motivate patients by clarifying the links between quality sleep and their goals of heart-healthy living.   Recognizing and Reducing Stress Clinical staff led group instruction and group discussion with PowerPoint presentation and patient guidebook. To enhance the learning environment the use of posters, models and videos may be added. At the conclusion of this workshop, patients will be able to understand the types of stress reactions, differentiate between acute and chronic stress, and recognize the impact that chronic  stress has on their health. They will also be able to apply different coping mechanisms, such as reframing negative self-talk. Patients will have the opportunity to practice a variety of stress management techniques, such as deep abdominal breathing, progressive muscle relaxation, and/or guided imagery.  The purpose of this lesson is to educate patients on the role of stress in their lives and to provide healthy techniques for coping with it.  Learning Barriers/Preferences:  Learning Barriers/Preferences - 02/26/24 1150       Learning Barriers/Preferences   Learning Barriers Sight   reading glasses   Learning Preferences Group Instruction;Individual Instruction;Skilled Demonstration          Education Topics:  Knowledge Questionnaire Score:  Knowledge Questionnaire Score - 02/26/24 1151       Knowledge Questionnaire Score   Pre Score 18/24          Core Components/Risk Factors/Patient Goals at Admission:  Personal Goals and Risk Factors at Admission - 02/26/24 1152       Core Components/Risk Factors/Patient Goals on Admission    Weight Management Obesity;Weight Loss    Hypertension Yes    Intervention Provide education on lifestyle modifcations including regular physical activity/exercise, weight management, moderate sodium restriction and increased consumption of fresh fruit, vegetables, and low fat dairy, alcohol moderation, and smoking cessation.;Monitor prescription use compliance.    Expected Outcomes Short Term: Continued assessment and intervention until BP is < 140/85mm HG in hypertensive participants. < 130/93mm HG in hypertensive participants with diabetes, heart failure or chronic kidney disease.;Long Term: Maintenance of blood pressure at goal levels.    Lipids Yes    Intervention Provide education and support for participant on nutrition & aerobic/resistive exercise along with prescribed medications to achieve LDL 70mg , HDL >40mg .    Expected Outcomes Short Term:  Participant states understanding of desired cholesterol values and is compliant with medications prescribed. Participant is following exercise prescription and nutrition guidelines.;Long Term: Cholesterol controlled with medications as prescribed, with individualized exercise RX and with personalized nutrition plan. Value goals: LDL < 70mg , HDL > 40 mg.    Stress Yes    Intervention Offer individual and/or small group education and counseling on adjustment  to heart disease, stress management and health-related lifestyle change. Teach and support self-help strategies.;Refer participants experiencing significant psychosocial distress to appropriate mental health specialists for further evaluation and treatment. When possible, include family members and significant others in education/counseling sessions.    Expected Outcomes Short Term: Participant demonstrates changes in health-related behavior, relaxation and other stress management skills, ability to obtain effective social support, and compliance with psychotropic medications if prescribed.;Long Term: Emotional wellbeing is indicated by absence of clinically significant psychosocial distress or social isolation.          Core Components/Risk Factors/Patient Goals Review:    Core Components/Risk Factors/Patient Goals at Discharge (Final Review):    ITP Comments:  ITP Comments     Row Name 02/26/24 1050           ITP Comments Dr Wilbert Bihari MD, Medical Director. Introduction to Pritikin Education program/ Intensive Cardiac Rehab. Initial Orientation Packet reviewed with the patient.          Comments: Participant attended orientation for the cardiac rehabilitation program on  02/26/2024  to perform initial intake and exercise walk test. Patient introduced to the Pritikin Program education and orientation packet was reviewed. Completed 6-minute walk test, measurements, initial ITP, and exercise prescription. Vital signs stable.  Telemetry-normal sinus rhythm, asymptomatic.   Service time was from 0928 to 11:12.

## 2024-02-26 NOTE — Progress Notes (Signed)
 Cardiac Rehab Medication Review by a Nurse  Does the patient  feel that his/her medications are working for him/her?  yes  Has the patient been experiencing any side effects to the medications prescribed?  no  Does the patient measure his/her own blood pressure or blood glucose at home?  yes   Does the patient have any problems obtaining medications due to transportation or finances?   no  Understanding of regimen: good Understanding of indications: good Potential of compliance: good    Nurse comments: Taylor Stanley is taking her medications as prescribed and has a good understanding of what her medications are for. Verneal checks her blood pressures every other day.    [name] Gaw [facility] RN 02/26/2024 9:46 AM

## 2024-03-03 ENCOUNTER — Encounter (HOSPITAL_COMMUNITY)
Admission: RE | Admit: 2024-03-03 | Discharge: 2024-03-03 | Disposition: A | Discharge status: Home / Self Care | Source: Ambulatory Visit | Attending: Cardiology | Admitting: Cardiology

## 2024-03-03 DIAGNOSIS — Z48812 Encounter for surgical aftercare following surgery on the circulatory system: Secondary | ICD-10-CM | POA: Insufficient documentation

## 2024-03-03 DIAGNOSIS — Z952 Presence of prosthetic heart valve: Secondary | ICD-10-CM | POA: Diagnosis not present

## 2024-03-03 NOTE — Progress Notes (Signed)
 Daily Session Note  Patient Details  Name: Taylor Stanley MRN: [id number] Date of Birth: 12-Feb-1956 Referring Provider:   Flowsheet Row INTENSIVE CARDIAC REHAB ORIENT from 02/26/2024 in [facility]  Referring Provider Newman Lawrence, MD    Encounter Date: 03/03/2024  Check In:  Session Check In - 03/03/24 1249       Check-In   Supervising physician immediately available to respond to emergencies [facility] MD immediately available    Physician(s) Josefa Beauvais NP    Location [facility]    Staff Present [name] Quan, RN, Mallory Parkins, MS, ACSM-CEP, CCRP, Exercise Physiologist;Jetta Vannie BS, ACSM-CEP, Exercise Physiologist;Joseph Lennon, RN, Avonne Gal, MS, ACSM-CEP, Exercise Physiologist;Bailey Elnor, MS, Exercise Physiologist    Virtual Visit No    Medication changes reported     No    Fall or balance concerns reported    No    Tobacco Cessation No Change    Current number of cigarettes/nicotine per day     0    Warm-up and Cool-down Performed as group-led instruction    Resistance Training Performed Yes    VAD Patient? No    PAD/SET Patient? No      Pain Assessment   Currently in Pain? No/denies    Pain Score 0-No pain    Pain Onset More than a month ago    Multiple Pain Sites No          Capillary Blood Glucose: No results found for this or any previous visit (from the past 24 hours).   Exercise Prescription Changes - 03/03/24 1400       Response to Exercise   Blood Pressure (Admit) 134/70    Blood Pressure (Exercise) 164/82    Blood Pressure (Exit) 122/62    Heart Rate (Admit) 59 bpm    Heart Rate (Exercise) 80 bpm    Heart Rate (Exit) 63 bpm    Rating of Perceived Exertion (Exercise) 13    Symptoms None    Comments Pt's first day in the [facility]2 program    Duration Continue with 30 min of aerobic exercise without signs/symptoms of physical distress.    Intensity THRR  unchanged      Progression   Progression Continue to progress workloads to maintain intensity without signs/symptoms of physical distress.    Average METs 2.6      Resistance Training   Training Prescription Yes    Weight 3 lbs    Reps 10-15    Time 5 Minutes      Interval Training   Interval Training No      NuStep   Level 2    SPM 77    Minutes 15    METs 2.2      Recumbant Elliptical   Level 2    RPM 150    Watts 64    Minutes 15    METs 3          Social History   Tobacco Use  Smoking Status Former   Current packs/day: 0.00   Average packs/day: 1 pack/day for 13.0 years (13.0 ttl pk-yrs)   Types: Cigarettes   Start date: 02/11/1992   Quit date: 02/10/2005   Years since quitting: 19.0  Smokeless Tobacco Never    Goals Met:  Exercise tolerated well No report of concerns or symptoms today Strength training completed today  Goals Unmet:  Not Applicable  Comments: Pt started cardiac rehab today.  Pt tolerated light exercise without difficulty. VSS, telemetry-Sinus Rhythm, asymptomatic.  Medication list reconciled. Pt denies barriers to medicaiton compliance.  PSYCHOSOCIAL ASSESSMENT:  PHQ-1. Pt exhibits positive coping skills, hopeful outlook with supportive family. No psychosocial needs identified at this time, no psychosocial interventions necessary.    Pt enjoys Cards, yard work, Biomedical scientist and crafts.   Pt oriented to exercise equipment and routine.    Understanding verbalized. [name] Elpidio Quan RN BSN    Dr. Wilbert Bihari is Medical Director for Cardiac Rehab at [facility].

## 2024-03-05 ENCOUNTER — Encounter (HOSPITAL_COMMUNITY)
Admission: RE | Admit: 2024-03-05 | Discharge: 2024-03-05 | Discharge status: Home / Self Care | Source: Ambulatory Visit | Attending: Cardiology

## 2024-03-05 DIAGNOSIS — Z952 Presence of prosthetic heart valve: Secondary | ICD-10-CM

## 2024-03-05 DIAGNOSIS — Z48812 Encounter for surgical aftercare following surgery on the circulatory system: Secondary | ICD-10-CM | POA: Diagnosis not present

## 2024-03-07 ENCOUNTER — Encounter (HOSPITAL_COMMUNITY)
Admission: RE | Admit: 2024-03-07 | Discharge: 2024-03-07 | Disposition: A | Discharge status: Home / Self Care | Source: Ambulatory Visit | Attending: Cardiology | Admitting: Cardiology

## 2024-03-07 DIAGNOSIS — Z48812 Encounter for surgical aftercare following surgery on the circulatory system: Secondary | ICD-10-CM | POA: Diagnosis not present

## 2024-03-07 DIAGNOSIS — Z952 Presence of prosthetic heart valve: Secondary | ICD-10-CM

## 2024-03-07 NOTE — Progress Notes (Signed)
 Cardiac Individual Treatment Plan  Patient Details  Name: Taylor Stanley MRN: [id number] Date of Birth: [birth year] Referring Provider:   Flowsheet Row INTENSIVE CARDIAC REHAB ORIENT from 02/26/2024 in [facility]  Referring Provider Newman Lawrence, MD    Initial Encounter Date:  Flowsheet Row INTENSIVE CARDIAC REHAB ORIENT from 02/26/2024 in [facility]  Date 02/26/24    Visit Diagnosis: 01/29/24 S/P TAVR  Patient's Home Medications on Admission:  Current Outpatient Medications:    albuterol  (VENTOLIN  HFA) 108 (90 Base) MCG/ACT inhaler, TAKE 2 PUFFS BY MOUTH EVERY 4 HOURS AS NEEDED, Disp: 18 g, Rfl: 2   ascorbic acid (VITAMIN C) 500 MG tablet, Take 1,000 mg by mouth daily., Disp: , Rfl:    aspirin  EC 81 MG tablet, Take 1 tablet (81 mg total) by mouth daily. Swallow whole., Disp: 90 tablet, Rfl: 2   azithromycin  (ZITHROMAX ) 500 MG tablet, Take 1 tablet (500 mg total) by mouth as directed. Take one tablet 1 hour before any dental work including cleanings., Disp: 6 tablet, Rfl: 12   carvedilol  (COREG ) 3.125 MG tablet, Take 1 tablet (3.125 mg total) by mouth 2 (two) times daily with a meal., Disp: 180 tablet, Rfl: 2   cholecalciferol (VITAMIN D) 1000 UNITS tablet, Take 1,000 Units by mouth in the morning., Disp: , Rfl:    clobetasol cream (TEMOVATE) 0.05 %, Apply 1 Application topically 2 (two) times daily as needed (rash)., Disp: , Rfl:    desloratadine  (CLARINEX ) 5 MG tablet, TAKE 1 TABLET BY MOUTH DAILY AS NEEDED, Disp: 90 tablet, Rfl: 1   esomeprazole  (NEXIUM ) 40 MG capsule, TAKE ONE CAPSULE BY MOUTH EVERY MORNING, Disp: 90 capsule, Rfl: 2   fluticasone  (FLONASE ) 50 MCG/ACT nasal spray, SPRAY 1-2 SPRAYS INTO EACH NOSTRIL 1-7 times per week., Disp: 16 mL, Rfl: 5   Fluticasone  Furoate (ARNUITY ELLIPTA ) 200 MCG/ACT AEPB, INHALE 1 PUFF BY MOUTH DAILY, Disp: 30 each, Rfl:  11   Golimumab (SIMPONI ARIA IV), Inject 1 Dose into the vein every 2 (two) months., Disp: , Rfl:    ibuprofen (ADVIL) 200 MG tablet, Take 200 mg by mouth every 6 (six) hours as needed for moderate pain (pain score 4-6). (Patient not taking: Reported on 02/26/2024), Disp: , Rfl:    nitroGLYCERIN  (NITROSTAT ) 0.4 MG SL tablet, Place 1 tablet (0.4 mg total) under the tongue every 5 (five) minutes as needed for chest pain., Disp: 25 tablet, Rfl: 4   olmesartan-hydrochlorothiazide (BENICAR HCT) 40-25 MG tablet, Take 1 tablet by mouth in the morning., Disp: , Rfl:    rosuvastatin  (CRESTOR ) 20 MG tablet, Take 1 tablet (20 mg total) by mouth daily., Disp: 90 tablet, Rfl: 2   sertraline  (ZOLOFT ) 100 MG tablet, Take 50 mg by mouth at bedtime., Disp: , Rfl:    triamcinolone cream (KENALOG) 0.1 %, Apply 1 Application topically 2 (two) times daily as needed (skin irritation)., Disp: , Rfl:   Past Medical History: Past Medical History:  Diagnosis Date   Anxiety    Asthma    triggered by allergies; does not use inhaler daily   Carpal tunnel syndrome of right wrist 11/2011   Dental crowns present    Depression    GERD (gastroesophageal reflux disease)    Hx of colonic polyps    Hypertension    under control; has been on med. x 5 yrs.   Osteopenia    PONV (postoperative  nausea and vomiting)    S/P TAVR (transcatheter aortic valve replacement) 01/29/2024   s/p TAVR with a 23 mm Edwards S3UR via the TF approach by Dr. Wonda and Dr. Lucas   Seasonal allergies    Seizure [facility]) as an infant   x 1 - unknown cause   Snores    denies apnea   TMJ disease    Wears glasses     Tobacco Use: Social History   Tobacco Use  Smoking Status Former   Current packs/day: 0.00   Average packs/day: 1 pack/day for 13.0 years (13.0 ttl pk-yrs)   Types: Cigarettes   Start date: 02/11/1992   Quit date: 02/10/2005   Years since quitting: 19.0  Smokeless Tobacco Never    Labs: Review Flowsheet  More data exists       Latest Ref Rng & Units 06/23/2013 02/22/2022 10/20/2022 11/26/2023 01/29/2024  Labs for ITP Cardiac and Pulmonary Rehab  Cholestrol 100 - 199 mg/dL 839  94  889  - -  LDL (calc) 0 - 99 mg/dL 892  41  57  - -  HDL-C >39 mg/dL 69.69  33  36  - -  Trlycerides 0 - 149 mg/dL 886.9  895  86  - -  PH, Arterial 7.35 - 7.45 - - - 7.418  -  PCO2 arterial 32 - 48 mmHg - - - 40.2  -  Bicarbonate 20.0 - 28.0 mmol/L - - - 27.7  25.9  28.2  -  TCO2 22 - 32 mmol/L - - - 29  27  30  26    O2 Saturation % - - - 76  96  75  -    Details       Multiple values from one day are sorted in reverse-chronological order         Capillary Blood Glucose: No results found for: GLUCAP   Exercise Target Goals: Exercise Program Goal: Individual exercise prescription set using results from initial 6 min walk test and THRR while considering  patient's activity barriers and safety.   Exercise Prescription Goal: Initial exercise prescription builds to 30-45 minutes a day of aerobic activity, 2-3 days per week.  Home exercise guidelines will be given to patient during program as part of exercise prescription that the participant will acknowledge.  Activity Barriers & Risk Stratification:  Activity Barriers & Cardiac Risk Stratification - 02/26/24 1311       Activity Barriers & Cardiac Risk Stratification   Activity Barriers Arthritis;Joint Problems;Balance Concerns;Shortness of Breath;Neck/Spine Problems;Back Problems    Cardiac Risk Stratification High          6 Minute Walk:  6 Minute Walk     Row Name 02/26/24 1310         6 Minute Walk   Phase Initial     Distance 1514 feet     Walk Time 6 minutes     # of Rest Breaks 0     MPH 2.9     METS 2.9     RPE 7     Perceived Dyspnea  1     VO2 Peak 10.2     Symptoms Yes (comment)     Comments Mild SOB, RPD = 1, Chronic left hip pain 2/10     Resting HR 62 bpm     Resting BP 138/76     Resting Oxygen Saturation  95 %     Exercise Oxygen  Saturation  during 6 min walk 96 %  Max Ex. HR 82 bpm     Max Ex. BP 152/80     2 Minute Post BP 130/80        Oxygen Initial Assessment:   Oxygen Re-Evaluation:   Oxygen Discharge (Final Oxygen Re-Evaluation):   Initial Exercise Prescription:  Initial Exercise Prescription - 02/26/24 1300       Date of Initial Exercise RX and Referring Provider   Date 02/26/24    Referring Provider Newman Lawrence, MD    Expected Discharge Date 05/14/24      NuStep   Level 2    SPM 75    Minutes 15    METs 2.9      Recumbant Elliptical   Level 2    RPM 60    Watts 75    Minutes 15    METs 2.9      Prescription Details   Frequency (times per week) 3    Duration Progress to 30 minutes of continuous aerobic without signs/symptoms of physical distress      Intensity   THRR 40-80% of Max Heartrate 60-120    Ratings of Perceived Exertion 11-13    Perceived Dyspnea 0-4      Progression   Progression Continue progressive overload as per policy without signs/symptoms or physical distress.      Resistance Training   Training Prescription Yes    Weight 3 lbs    Reps 10-15          Perform Capillary Blood Glucose checks as needed.  Exercise Prescription Changes:   Exercise Prescription Changes     Row Name 03/03/24 1400             Response to Exercise   Blood Pressure (Admit) 134/70       Blood Pressure (Exercise) 164/82       Blood Pressure (Exit) 122/62       Heart Rate (Admit) 59 bpm       Heart Rate (Exercise) 80 bpm       Heart Rate (Exit) 63 bpm       Rating of Perceived Exertion (Exercise) 13       Symptoms None       Comments Pt's first day in the CRP2 program       Duration Continue with 30 min of aerobic exercise without signs/symptoms of physical distress.       Intensity THRR unchanged         Progression   Progression Continue to progress workloads to maintain intensity without signs/symptoms of physical distress.       Average METs 2.6          Resistance Training   Training Prescription Yes       Weight 3 lbs       Reps 10-15       Time 5 Minutes         Interval Training   Interval Training No         NuStep   Level 2       SPM 77       Minutes 15       METs 2.2         Recumbant Elliptical   Level 2       RPM 150       Watts 64       Minutes 15       METs 3          Exercise Comments:  Exercise Comments     Row Name 03/03/24 1422           Exercise Comments Pt's first day in the CRP2 program. Pt tolerated initial session well. Pt did complain that she found the seat on the Octane uncomfortable on her buttocks; will reassess next session.          Exercise Goals and Review:   Exercise Goals     Row Name 02/26/24 1314             Exercise Goals   Increase Physical Activity Yes       Intervention Provide advice, education, support and counseling about physical activity/exercise needs.;Develop an individualized exercise prescription for aerobic and resistive training based on initial evaluation findings, risk stratification, comorbidities and participant's personal goals.       Expected Outcomes Long Term: Exercising regularly at least 3-5 days a week.;Short Term: Attend rehab on a regular basis to increase amount of physical activity.;Long Term: Add in home exercise to make exercise part of routine and to increase amount of physical activity.       Increase Strength and Stamina Yes       Intervention Provide advice, education, support and counseling about physical activity/exercise needs.;Develop an individualized exercise prescription for aerobic and resistive training based on initial evaluation findings, risk stratification, comorbidities and participant's personal goals.       Expected Outcomes Short Term: Increase workloads from initial exercise prescription for resistance, speed, and METs.;Short Term: Perform resistance training exercises routinely during rehab and add in resistance training  at home;Long Term: Improve cardiorespiratory fitness, muscular endurance and strength as measured by increased METs and functional capacity ( )       Able to understand and use rate of perceived exertion (RPE) scale Yes       Intervention Provide education and explanation on how to use RPE scale       Expected Outcomes Short Term: Able to use RPE daily in rehab to express subjective intensity level;Long Term:  Able to use RPE to guide intensity level when exercising independently       Knowledge and understanding of Target Heart Rate Range (THRR) Yes       Intervention Provide education and explanation of THRR including how the numbers were predicted and where they are located for reference       Expected Outcomes Long Term: Able to use THRR to govern intensity when exercising independently;Short Term: Able to use daily as guideline for intensity in rehab;Short Term: Able to state/look up THRR       Understanding of Exercise Prescription Yes       Intervention Provide education, explanation, and written materials on patient's individual exercise prescription       Expected Outcomes Short Term: Able to explain program exercise prescription;Long Term: Able to explain home exercise prescription to exercise independently          Exercise Goals Re-Evaluation :  Exercise Goals Re-Evaluation     Row Name 03/03/24 1421             Exercise Goal Re-Evaluation   Exercise Goals Review Increase Physical Activity;Understanding of Exercise Prescription;Increase Strength and Stamina;Knowledge and understanding of Target Heart Rate Range (THRR);Able to understand and use rate of perceived exertion (RPE) scale       Comments Pt's first day in the CRP2 program. Pt understands the exercise Rx, RPE scale and THRR.       Expected Outcomes Will continue to monitor the  patient and increase exercise workloads as tolerated.          Discharge Exercise Prescription (Final Exercise Prescription Changes):   Exercise Prescription Changes - 03/03/24 1400       Response to Exercise   Blood Pressure (Admit) 134/70    Blood Pressure (Exercise) 164/82    Blood Pressure (Exit) 122/62    Heart Rate (Admit) 59 bpm    Heart Rate (Exercise) 80 bpm    Heart Rate (Exit) 63 bpm    Rating of Perceived Exertion (Exercise) 13    Symptoms None    Comments Pt's first day in the CRP2 program    Duration Continue with 30 min of aerobic exercise without signs/symptoms of physical distress.    Intensity THRR unchanged      Progression   Progression Continue to progress workloads to maintain intensity without signs/symptoms of physical distress.    Average METs 2.6      Resistance Training   Training Prescription Yes    Weight 3 lbs    Reps 10-15    Time 5 Minutes      Interval Training   Interval Training No      NuStep   Level 2    SPM 77    Minutes 15    METs 2.2      Recumbant Elliptical   Level 2    RPM 150    Watts 64    Minutes 15    METs 3          Nutrition:  Target Goals: Understanding of nutrition guidelines, daily intake of sodium 1500mg , cholesterol 200mg , calories 30% from fat and 7% or less from saturated fats, daily to have 5 or more servings of fruits and vegetables.  Biometrics:  Pre Biometrics - 02/26/24 0937       Pre Biometrics   Waist Circumference 39.5 inches    Hip Circumference 43 inches    Waist to Hip Ratio 0.92 %    Triceps Skinfold 30 mm    % Body Fat 43.8 %    Grip Strength 14 kg    Flexibility 14.25 in    Single Leg Stand 4.18 seconds           Nutrition Therapy Plan and Nutrition Goals:  Nutrition Therapy & Goals - 03/05/24 0834       Nutrition Therapy   Diet Heart Healthy Diet    Drug/Food Interactions Statins/Certain Fruits      Personal Nutrition Goals   Nutrition Goal Patient to identify strategies for reducing cardiovascular risk by attending the [employer] education and nutrition series weekly.    Personal Goal #2 Patient to  improve diet quality by using the plate method as a guide for meal planning to include lean protein/plant protein, fruits, vegetables, whole grains, nonfat dairy as part of a well-balanced diet.    Comments Patient has medical history of TAVR, HTN, CAD, hyperlipidemia, hx of tobacco abuse, rheumatoid arthritis. Lipids are well controlled. Patient will benefit from participation in intensive cardiac rehab for nutrition education, exercise, and lifestyle modification.      Intervention Plan   Intervention Prescribe, educate and counsel regarding individualized specific dietary modifications aiming towards targeted core components such as weight, hypertension, lipid management, diabetes, heart failure and other comorbidities.;Nutrition handout(s) given to patient.    Expected Outcomes Short Term Goal: Understand basic principles of dietary content, such as calories, fat, sodium, cholesterol and nutrients.;Long Term Goal: Adherence to prescribed nutrition plan.  Nutrition Assessments:  Nutrition Assessments - 03/07/24 1343       Rate Your Plate Scores   Pre Score 61         MEDIFICTS Score Key: >=70 Need to make dietary changes  40-70 Heart Healthy Diet <= 40 Therapeutic Level Cholesterol Diet   Flowsheet Row INTENSIVE CARDIAC REHAB from 03/07/2024 in [facility]  Picture Your Plate Total Score on Admission 61   Picture Your Plate Scores: <59 Unhealthy dietary pattern with much room for improvement. 41-50 Dietary pattern unlikely to meet recommendations for good health and room for improvement. 51-60 More healthful dietary pattern, with some room for improvement.  >60 Healthy dietary pattern, although there may be some specific behaviors that could be improved.    Nutrition Goals Re-Evaluation:  Nutrition Goals Re-Evaluation     Row Name 03/05/24 0834             Goals   Current Weight 163 lb 9.3 oz (74.2 kg)        Comment Lpa WNL, HDl 36, LDL 57       Expected Outcome Patient has medical history of TAVR, HTN, CAD, hyperlipidemia, hx of tobacco abuse, rheumatoid arthritis. Lipids are well controlled. Patient will benefit from participation in intensive cardiac rehab for nutrition education, exercise, and lifestyle modification.          Nutrition Goals Re-Evaluation:  Nutrition Goals Re-Evaluation     Row Name 03/05/24 0834             Goals   Current Weight 163 lb 9.3 oz (74.2 kg)       Comment Lpa WNL, HDl 36, LDL 57       Expected Outcome Patient has medical history of TAVR, HTN, CAD, hyperlipidemia, hx of tobacco abuse, rheumatoid arthritis. Lipids are well controlled. Patient will benefit from participation in intensive cardiac rehab for nutrition education, exercise, and lifestyle modification.          Nutrition Goals Discharge (Final Nutrition Goals Re-Evaluation):  Nutrition Goals Re-Evaluation - 03/05/24 0834       Goals   Current Weight 163 lb 9.3 oz (74.2 kg)    Comment Lpa WNL, HDl 36, LDL 57    Expected Outcome Patient has medical history of TAVR, HTN, CAD, hyperlipidemia, hx of tobacco abuse, rheumatoid arthritis. Lipids are well controlled. Patient will benefit from participation in intensive cardiac rehab for nutrition education, exercise, and lifestyle modification.          Psychosocial: Target Goals: Acknowledge presence or absence of significant depression and/or stress, maximize coping skills, provide positive support system. Participant is able to verbalize types and ability to use techniques and skills needed for reducing stress and depression.  Initial Review & Psychosocial Screening:  Initial Psych Review & Screening - 02/26/24 1053       Initial Review   Current issues with Current Anxiety/Panic      Family Dynamics   Good Support System? Yes   Maurine has her husband and two children and a grandson brother and sister for support.     Barriers    Psychosocial barriers to participate in program There are no identifiable barriers or psychosocial needs.      Screening Interventions   Interventions Encouraged to exercise          Quality of Life Scores:  Quality of Life - 02/26/24 1150       Quality of Life  Select Quality of Life      Quality of Life Scores   Health/Function Pre 25.2 %    Socioeconomic Pre 27.14 %    Psych/Spiritual Pre 28.93 %    Family Pre 28 %    GLOBAL Pre 26.78 %         Scores of 19 and below usually indicate a poorer quality of life in these areas.  A difference of  2-3 points is a clinically meaningful difference.  A difference of 2-3 points in the total score of the Quality of Life Index has been associated with significant improvement in overall quality of life, self-image, physical symptoms, and general health in studies assessing change in quality of life.  PHQ-9: Review Flowsheet       02/26/2024 07/21/2013  Depression screen PHQ 2/9  Decreased Interest 0 0  Down, Depressed, Hopeless 0 0  PHQ - 2 Score 0 0  Altered sleeping 0 -  Tired, decreased energy 1 -  Change in appetite 0 -  Feeling bad or failure about yourself  0 -  Trouble concentrating 0 -  Moving slowly or fidgety/restless 0 -  Suicidal thoughts 0 -  PHQ-9 Score 1 -  Difficult doing work/chores Not difficult at all -   Interpretation of Total Score  Total Score Depression Severity:  1-4 = Minimal depression, 5-9 = Mild depression, 10-14 = Moderate depression, 15-19 = Moderately severe depression, 20-27 = Severe depression   Psychosocial Evaluation and Intervention:   Psychosocial Re-Evaluation:  Psychosocial Re-Evaluation     Row Name 03/03/24 1435 03/07/24 1445           Psychosocial Re-Evaluation   Current issues with Current Anxiety/Panic Current Anxiety/Panic      Comments Pt started cardiac rehab today (02/18/24). He did not voice any concerns or stressors. Pt has not voiced any increased concerns or  stressors during exercise at cardiac rehab.      Expected Outcomes Pt will have controlled/reduced stress and anxiety by completion of cardiac rehab. Pt will have controlled/reduced stress and anxiety by completion of cardiac rehab.      Interventions Encouraged to attend Cardiac Rehabilitation for the exercise;Stress management education;Relaxation education Encouraged to attend Cardiac Rehabilitation for the exercise;Stress management education;Relaxation education      Continue Psychosocial Services  Follow up required by staff  Will continue to monitor and offer support as needed. Follow up required by staff  will continue to monitor and offer support as needed         Psychosocial Discharge (Final Psychosocial Re-Evaluation):  Psychosocial Re-Evaluation - 03/07/24 1445       Psychosocial Re-Evaluation   Current issues with Current Anxiety/Panic    Comments Pt has not voiced any increased concerns or stressors during exercise at cardiac rehab.    Expected Outcomes Pt will have controlled/reduced stress and anxiety by completion of cardiac rehab.    Interventions Encouraged to attend Cardiac Rehabilitation for the exercise;Stress management education;Relaxation education    Continue Psychosocial Services  Follow up required by staff   will continue to monitor and offer support as needed         Vocational Rehabilitation: Provide vocational rehab assistance to qualifying candidates.   Vocational Rehab Evaluation & Intervention:  Vocational Rehab - 02/26/24 1054       Initial Vocational Rehab Evaluation & Intervention   Assessment shows need for Vocational Rehabilitation No   [name] is retired and does not need vocational rehab at this time  Education: Education Goals: Education classes will be provided on a weekly basis, covering required topics. Participant will state understanding/return demonstration of topics presented.    Education     Row Name 03/03/24 1200      Education   Cardiac Education Topics Pritikin   Glass blower/designer Nutrition   Nutrition Workshop Label Reading   Instruction Review Code 1- Verbalizes Understanding   Class Start Time 1145   Class Stop Time 1230   Class Time Calculation (min) 45 min    Row Name 03/07/24 1400     Education   Cardiac Education Topics Pritikin   Psychologist, counselling   Select Nutrition   Nutrition Other   Instruction Review Code 1- Verbalizes Understanding    Row Name 03/10/24 1200     Education   Cardiac Education Topics Pritikin   Hospital doctor Education   General Education Metabolic Syndrome and Belly Fat   Instruction Review Code 1- Verbalizes Understanding   Class Start Time 1200   Class Stop Time 1235   Class Time Calculation (min) 35 min      Core Videos: Exercise    Move It!  Clinical staff conducted group or individual video education with verbal and written material and guidebook.  Patient learns the recommended Pritikin exercise program. Exercise with the goal of living a long, healthy life. Some of the health benefits of exercise include controlled diabetes, healthier blood pressure levels, improved cholesterol levels, improved heart and lung capacity, improved sleep, and better body composition. Everyone should speak with their doctor before starting or changing an exercise routine.  Biomechanical Limitations Clinical staff conducted group or individual video education with verbal and written material and guidebook.  Patient learns how biomechanical limitations can impact exercise and how we can mitigate and possibly overcome limitations to have an impactful and balanced exercise routine.  Body Composition Clinical staff conducted group or individual video education with verbal and written material and guidebook.  Patient  learns that body composition (ratio of muscle mass to fat mass) is a key component to assessing overall fitness, rather than body weight alone. Increased fat mass, especially visceral belly fat, can put us  at increased risk for metabolic syndrome, type 2 diabetes, heart disease, and even death. It is recommended to combine diet and exercise (cardiovascular and resistance training) to improve your body composition. Seek guidance from your physician and exercise physiologist before implementing an exercise routine.  Exercise Action Plan Clinical staff conducted group or individual video education with verbal and written material and guidebook.  Patient learns the recommended strategies to achieve and enjoy long-term exercise adherence, including variety, self-motivation, self-efficacy, and positive decision making. Benefits of exercise include fitness, good health, weight management, more energy, better sleep, less stress, and overall well-being.  Medical   Heart Disease Risk Reduction Clinical staff conducted group or individual video education with verbal and written material and guidebook.  Patient learns our heart is our most vital organ as it circulates oxygen, nutrients, white blood cells, and hormones throughout the entire body, and carries waste away. Data supports a plant-based eating plan like the Pritikin Program for its effectiveness in slowing progression of and reversing heart disease. The video provides a number of recommendations to address heart disease.   Metabolic Syndrome and  Belly Fat  Clinical staff conducted group or individual video education with verbal and written material and guidebook.  Patient learns what metabolic syndrome is, how it leads to heart disease, and how one can reverse it and keep it from coming back. You have metabolic syndrome if you have 3 of the following 5 criteria: abdominal obesity, high blood pressure, high triglycerides, low HDL cholesterol, and high  blood sugar.  Hypertension and Heart Disease Clinical staff conducted group or individual video education with verbal and written material and guidebook.  Patient learns that high blood pressure, or hypertension, is very common in the [place] . Hypertension is largely due to excessive salt intake, but other important risk factors include being overweight, physical inactivity, drinking too much alcohol, smoking, and not eating enough potassium from fruits and vegetables. High blood pressure is a leading risk factor for heart attack, stroke, congestive heart failure, dementia, kidney failure, and premature death. Long-term effects of excessive salt intake include stiffening of the arteries and thickening of heart muscle and organ damage. Recommendations include ways to reduce hypertension and the risk of heart disease.  Diseases of Our Time - Focusing on Diabetes Clinical staff conducted group or individual video education with verbal and written material and guidebook.  Patient learns why the best way to stop diseases of our time is prevention, through food and other lifestyle changes. Medicine (such as prescription pills and surgeries) is often only a Band-Aid on the problem, not a long-term solution. Most common diseases of our time include obesity, type 2 diabetes, hypertension, heart disease, and cancer. The Pritikin Program is recommended and has been proven to help reduce, reverse, and/or prevent the damaging effects of metabolic syndrome.  Nutrition   Overview of the Pritikin Eating Plan  Clinical staff conducted group or individual video education with verbal and written material and guidebook.  Patient learns about the Pritikin Eating Plan for disease risk reduction. The Pritikin Eating Plan emphasizes a wide variety of unrefined, minimally-processed carbohydrates, like fruits, vegetables, whole grains, and legumes. Go, Caution, and Stop food choices are explained. Plant-based and lean  animal proteins are emphasized. Rationale provided for low sodium intake for blood pressure control, low added sugars for blood sugar stabilization, and low added fats and oils for coronary artery disease risk reduction and weight management.  Calorie Density  Clinical staff conducted group or individual video education with verbal and written material and guidebook.  Patient learns about calorie density and how it impacts the Pritikin Eating Plan. Knowing the characteristics of the food you choose will help you decide whether those foods will lead to weight gain or weight loss, and whether you want to consume more or less of them. Weight loss is usually a side effect of the Pritikin Eating Plan because of its focus on low calorie-dense foods.  Label Reading  Clinical staff conducted group or individual video education with verbal and written material and guidebook.  Patient learns about the Pritikin recommended label reading guidelines and corresponding recommendations regarding calorie density, added sugars, sodium content, and whole grains.  Dining Out - Part 1  Clinical staff conducted group or individual video education with verbal and written material and guidebook.  Patient learns that restaurant meals can be sabotaging because they can be so high in calories, fat, sodium, and/or sugar. Patient learns recommended strategies on how to positively address this and avoid unhealthy pitfalls.  Facts on Fats  Clinical staff conducted group or individual video education with verbal and written  material and guidebook.  Patient learns that lifestyle modifications can be just as effective, if not more so, as many medications for lowering your risk of heart disease. A Pritikin lifestyle can help to reduce your risk of inflammation and atherosclerosis (cholesterol build-up, or plaque, in the artery walls). Lifestyle interventions such as dietary choices and physical activity address the cause of  atherosclerosis. A review of the types of fats and their impact on blood cholesterol levels, along with dietary recommendations to reduce fat intake is also included.  Nutrition Action Plan  Clinical staff conducted group or individual video education with verbal and written material and guidebook.  Patient learns how to incorporate Pritikin recommendations into their lifestyle. Recommendations include planning and keeping personal health goals in mind as an important part of their success.  Healthy Mind-Set    Healthy Minds, Bodies, Hearts  Clinical staff conducted group or individual video education with verbal and written material and guidebook.  Patient learns how to identify when they are stressed. Video will discuss the impact of that stress, as well as the many benefits of stress management. Patient will also be introduced to stress management techniques. The way we think, act, and feel has an impact on our hearts.  How Our Thoughts Can Heal Our Hearts  Clinical staff conducted group or individual video education with verbal and written material and guidebook.  Patient learns that negative thoughts can cause depression and anxiety. This can result in negative lifestyle behavior and serious health problems. Cognitive behavioral therapy is an effective method to help control our thoughts in order to change and improve our emotional outlook.  Additional Videos:  Exercise    Improving Performance  Clinical staff conducted group or individual video education with verbal and written material and guidebook.  Patient learns to use a non-linear approach by alternating intensity levels and lengths of time spent exercising to help burn more calories and lose more body fat. Cardiovascular exercise helps improve heart health, metabolism, hormonal balance, blood sugar control, and recovery from fatigue. Resistance training improves strength, endurance, balance, coordination, reaction time, metabolism,  and muscle mass. Flexibility exercise improves circulation, posture, and balance. Seek guidance from your physician and exercise physiologist before implementing an exercise routine and learn your capabilities and proper form for all exercise.  Introduction to Yoga  Clinical staff conducted group or individual video education with verbal and written material and guidebook.  Patient learns about yoga, a discipline of the coming together of mind, breath, and body. The benefits of yoga include improved flexibility, improved range of motion, better posture and core strength, increased lung function, weight loss, and positive self-image. Yoga's heart health benefits include lowered blood pressure, healthier heart rate, decreased cholesterol and triglyceride levels, improved immune function, and reduced stress. Seek guidance from your physician and exercise physiologist before implementing an exercise routine and learn your capabilities and proper form for all exercise.  Medical   Aging: Enhancing Your Quality of Life  Clinical staff conducted group or individual video education with verbal and written material and guidebook.  Patient learns key strategies and recommendations to stay in good physical health and enhance quality of life, such as prevention strategies, having an advocate, securing a Health Care Proxy and Power of Attorney, and keeping a list of medications and system for tracking them. It also discusses how to avoid risk for bone loss.  Biology of Weight Control  Clinical staff conducted group or individual video education with verbal and written material and guidebook.  Patient learns that weight gain occurs because we consume more calories than we burn (eating more, moving less). Even if your body weight is normal, you may have higher ratios of fat compared to muscle mass. Too much body fat puts you at increased risk for cardiovascular disease, heart attack, stroke, type 2 diabetes, and  obesity-related cancers. In addition to exercise, following the Pritikin Eating Plan can help reduce your risk.  Decoding Lab Results  Clinical staff conducted group or individual video education with verbal and written material and guidebook.  Patient learns that lab test reflects one measurement whose values change over time and are influenced by many factors, including medication, stress, sleep, exercise, food, hydration, pre-existing medical conditions, and more. It is recommended to use the knowledge from this video to become more involved with your lab results and evaluate your numbers to speak with your doctor.   Diseases of Our Time - Overview  Clinical staff conducted group or individual video education with verbal and written material and guidebook.  Patient learns that according to the CDC, 50% to 70% of chronic diseases (such as obesity, type 2 diabetes, elevated lipids, hypertension, and heart disease) are avoidable through lifestyle improvements including healthier food choices, listening to satiety cues, and increased physical activity.  Sleep Disorders Clinical staff conducted group or individual video education with verbal and written material and guidebook.  Patient learns how good quality and duration of sleep are important to overall health and well-being. Patient also learns about sleep disorders and how they impact health along with recommendations to address them, including discussing with a physician.  Nutrition  Dining Out - Part 2 Clinical staff conducted group or individual video education with verbal and written material and guidebook.  Patient learns how to plan ahead and communicate in order to maximize their dining experience in a healthy and nutritious manner. Included are recommended food choices based on the type of restaurant the patient is visiting.   Fueling a Banker conducted group or individual video education with verbal and written  material and guidebook.  There is a strong connection between our food choices and our health. Diseases like obesity and type 2 diabetes are very prevalent and are in large-part due to lifestyle choices. The Pritikin Eating Plan provides plenty of food and hunger-curbing satisfaction. It is easy to follow, affordable, and helps reduce health risks.  Menu Workshop  Clinical staff conducted group or individual video education with verbal and written material and guidebook.  Patient learns that restaurant meals can sabotage health goals because they are often packed with calories, fat, sodium, and sugar. Recommendations include strategies to plan ahead and to communicate with the manager, chef, or server to help order a healthier meal.  Planning Your Eating Strategy  Clinical staff conducted group or individual video education with verbal and written material and guidebook.  Patient learns about the Pritikin Eating Plan and its benefit of reducing the risk of disease. The Pritikin Eating Plan does not focus on calories. Instead, it emphasizes high-quality, nutrient-rich foods. By knowing the characteristics of the foods, we choose, we can determine their calorie density and make informed decisions.  Targeting Your Nutrition Priorities  Clinical staff conducted group or individual video education with verbal and written material and guidebook.  Patient learns that lifestyle habits have a tremendous impact on disease risk and progression. This video provides eating and physical activity recommendations based on your personal health goals, such as reducing LDL cholesterol, losing  weight, preventing or controlling type 2 diabetes, and reducing high blood pressure.  Vitamins and Minerals  Clinical staff conducted group or individual video education with verbal and written material and guidebook.  Patient learns different ways to obtain key vitamins and minerals, including through a recommended healthy diet.  It is important to discuss all supplements you take with your doctor.   Healthy Mind-Set    Smoking Cessation  Clinical staff conducted group or individual video education with verbal and written material and guidebook.  Patient learns that cigarette smoking and tobacco addiction pose a serious health risk which affects millions of people. Stopping smoking will significantly reduce the risk of heart disease, lung disease, and many forms of cancer. Recommended strategies for quitting are covered, including working with your doctor to develop a successful plan.  Culinary   Becoming a Set designer conducted group or individual video education with verbal and written material and guidebook.  Patient learns that cooking at home can be healthy, cost-effective, quick, and puts them in control. Keys to cooking healthy recipes will include looking at your recipe, assessing your equipment needs, planning ahead, making it simple, choosing cost-effective seasonal ingredients, and limiting the use of added fats, salts, and sugars.  Cooking - Breakfast and Snacks  Clinical staff conducted group or individual video education with verbal and written material and guidebook.  Patient learns how important breakfast is to satiety and nutrition through the entire day. Recommendations include key foods to eat during breakfast to help stabilize blood sugar levels and to prevent overeating at meals later in the day. Planning ahead is also a key component.  Cooking - Educational psychologist conducted group or individual video education with verbal and written material and guidebook.  Patient learns eating strategies to improve overall health, including an approach to cook more at home. Recommendations include thinking of animal protein as a side on your plate rather than center stage and focusing instead on lower calorie dense options like vegetables, fruits, whole grains, and plant-based  proteins, such as beans. Making sauces in large quantities to freeze for later and leaving the skin on your vegetables are also recommended to maximize your experience.  Cooking - Healthy Salads and Dressing Clinical staff conducted group or individual video education with verbal and written material and guidebook.  Patient learns that vegetables, fruits, whole grains, and legumes are the foundations of the Pritikin Eating Plan. Recommendations include how to incorporate each of these in flavorful and healthy salads, and how to create homemade salad dressings. Proper handling of ingredients is also covered. Cooking - Soups and [employer] - Soups and Desserts Clinical staff conducted group or individual video education with verbal and written material and guidebook.  Patient learns that Pritikin soups and desserts make for easy, nutritious, and delicious snacks and meal components that are low in sodium, fat, sugar, and calorie density, while high in vitamins, minerals, and filling fiber. Recommendations include simple and healthy ideas for soups and desserts.   Overview     The Pritikin Solution Program Overview Clinical staff conducted group or individual video education with verbal and written material and guidebook.  Patient learns that the results of the Pritikin Program have been documented in more than 100 articles published in peer-reviewed journals, and the benefits include reducing risk factors for (and, in some cases, even reversing) high cholesterol, high blood pressure, type 2 diabetes, obesity, and more! An overview of the three key pillars  of the Pritikin Program will be covered: eating well, doing regular exercise, and having a healthy mind-set.  WORKSHOPS  Exercise: Exercise Basics: Building Your Action Plan Clinical staff led group instruction and group discussion with PowerPoint presentation and patient guidebook. To enhance the learning environment the use of posters,  models and videos may be added. At the conclusion of this workshop, patients will comprehend the difference between physical activity and exercise, as well as the benefits of incorporating both, into their routine. Patients will understand the FITT (Frequency, Intensity, Time, and Type) principle and how to use it to build an exercise action plan. In addition, safety concerns and other considerations for exercise and cardiac rehab will be addressed by the presenter. The purpose of this lesson is to promote a comprehensive and effective weekly exercise routine in order to improve patients' overall level of fitness.   Managing Heart Disease: Your Path to a Healthier Heart Clinical staff led group instruction and group discussion with PowerPoint presentation and patient guidebook. To enhance the learning environment the use of posters, models and videos may be added.At the conclusion of this workshop, patients will understand the anatomy and physiology of the heart. Additionally, they will understand how Pritikin's three pillars impact the risk factors, the progression, and the management of heart disease.  The purpose of this lesson is to provide a high-level overview of the heart, heart disease, and how the Pritikin lifestyle positively impacts risk factors.  Exercise Biomechanics Clinical staff led group instruction and group discussion with PowerPoint presentation and patient guidebook. To enhance the learning environment the use of posters, models and videos may be added. Patients will learn how the structural parts of their bodies function and how these functions impact their daily activities, movement, and exercise. Patients will learn how to promote a neutral spine, learn how to manage pain, and identify ways to improve their physical movement in order to promote healthy living. The purpose of this lesson is to expose patients to common physical limitations that impact physical activity.  Participants will learn practical ways to adapt and manage aches and pains, and to minimize their effect on regular exercise. Patients will learn how to maintain good posture while sitting, walking, and lifting.  Balance Training and Fall Prevention  Clinical staff led group instruction and group discussion with PowerPoint presentation and patient guidebook. To enhance the learning environment the use of posters, models and videos may be added. At the conclusion of this workshop, patients will understand the importance of their sensorimotor skills (vision, proprioception, and the vestibular system) in maintaining their ability to balance as they age. Patients will apply a variety of balancing exercises that are appropriate for their current level of function. Patients will understand the common causes for poor balance, possible solutions to these problems, and ways to modify their physical environment in order to minimize their fall risk. The purpose of this lesson is to teach patients about the importance of maintaining balance as they age and ways to minimize their risk of falling.  WORKSHOPS   Nutrition:  Fueling a Ship broker led group instruction and group discussion with PowerPoint presentation and patient guidebook. To enhance the learning environment the use of posters, models and videos may be added. Patients will review the foundational principles of the Pritikin Eating Plan and understand what constitutes a serving size in each of the food groups. Patients will also learn Pritikin-friendly foods that are better choices when away from home and review make-ahead meal  and snack options. Calorie density will be reviewed and applied to three nutrition priorities: weight maintenance, weight loss, and weight gain. The purpose of this lesson is to reinforce (in a group setting) the key concepts around what patients are recommended to eat and how to apply these guidelines when away  from home by planning and selecting Pritikin-friendly options. Patients will understand how calorie density may be adjusted for different weight management goals.  Mindful Eating  Clinical staff led group instruction and group discussion with PowerPoint presentation and patient guidebook. To enhance the learning environment the use of posters, models and videos may be added. Patients will briefly review the concepts of the Pritikin Eating Plan and the importance of low-calorie dense foods. The concept of mindful eating will be introduced as well as the importance of paying attention to internal hunger signals. Triggers for non-hunger eating and techniques for dealing with triggers will be explored. The purpose of this lesson is to provide patients with the opportunity to review the basic principles of the Pritikin Eating Plan, discuss the value of eating mindfully and how to measure internal cues of hunger and fullness using the Hunger Scale. Patients will also discuss reasons for non-hunger eating and learn strategies to use for controlling emotional eating.  Targeting Your Nutrition Priorities Clinical staff led group instruction and group discussion with PowerPoint presentation and patient guidebook. To enhance the learning environment the use of posters, models and videos may be added. Patients will learn how to determine their genetic susceptibility to disease by reviewing their family history. Patients will gain insight into the importance of diet as part of an overall healthy lifestyle in mitigating the impact of genetics and other environmental insults. The purpose of this lesson is to provide patients with the opportunity to assess their personal nutrition priorities by looking at their family history, their own health history and current risk factors. Patients will also be able to discuss ways of prioritizing and modifying the Pritikin Eating Plan for their highest risk areas  Menu  Clinical  staff led group instruction and group discussion with PowerPoint presentation and patient guidebook. To enhance the learning environment the use of posters, models and videos may be added. Using menus brought in from [employer], or printed from [employer], patients will apply the Pritikin dining out guidelines that were presented in the [employer] video. Patients will also be able to practice these guidelines in a variety of provided scenarios. The purpose of this lesson is to provide patients with the opportunity to practice hands-on learning of the Pritikin Dining Out guidelines with actual menus and practice scenarios.  Label Reading Clinical staff led group instruction and group discussion with PowerPoint presentation and patient guidebook. To enhance the learning environment the use of posters, models and videos may be added. Patients will review and discuss the Pritikin label reading guidelines presented in Pritikin's Label Reading Educational series video. Using fool labels brought in from local grocery stores and markets, patients will apply the label reading guidelines and determine if the packaged food meet the Pritikin guidelines. The purpose of this lesson is to provide patients with the opportunity to review, discuss, and practice hands-on learning of the Pritikin Label Reading guidelines with actual packaged food labels. Cooking School  Pritikin's [employer] are designed to teach patients ways to prepare quick, simple, and affordable recipes at home. The importance of nutrition's role in chronic disease risk reduction is reflected in its emphasis in the overall  Pritikin program. By learning how to prepare essential core Pritikin Eating Plan recipes, patients will increase control over what they eat; be able to customize the flavor of foods without the use of added salt, sugar, or fat; and improve the quality of the food they consume. By learning a set of  core recipes which are easily assembled, quickly prepared, and affordable, patients are more likely to prepare more healthy foods at home. These workshops focus on convenient breakfasts, simple entres, side dishes, and desserts which can be prepared with minimal effort and are consistent with nutrition recommendations for cardiovascular risk reduction. Cooking [employer] are taught by a Armed forces logistics/support/administrative officer (RD) who has been trained by the [employer]. The chef or RD has a clear understanding of the importance of minimizing - if not completely eliminating - added fat, sugar, and sodium in recipes. Throughout the series of Cooking School Workshop sessions, patients will learn about healthy ingredients and efficient methods of cooking to build confidence in their capability to prepare    Cooking School weekly topics:  Adding Flavor- Sodium-Free  Fast and Healthy Breakfasts  Powerhouse Plant-Based Proteins  Satisfying Salads and Dressings  Simple Sides and Sauces  International Cuisine-Spotlight on the [employer] Zones  Delicious Desserts  Savory Soups  [employer] - Meals in a Astronomer Appetizers and Snacks  Comforting Weekend Breakfasts  One-Pot Wonders   Fast Evening Meals  Landscape architect Your Pritikin Plate  WORKSHOPS   Healthy Mindset (Psychosocial):  Focused Goals, Sustainable Changes Clinical staff led group instruction and group discussion with PowerPoint presentation and patient guidebook. To enhance the learning environment the use of posters, models and videos may be added. Patients will be able to apply effective goal setting strategies to establish at least one personal goal, and then take consistent, meaningful action toward that goal. They will learn to identify common barriers to achieving personal goals and develop strategies to overcome them. Patients will also gain an understanding of how our mind-set can impact our ability  to achieve goals and the importance of cultivating a positive and growth-oriented mind-set. The purpose of this lesson is to provide patients with a deeper understanding of how to set and achieve personal goals, as well as the tools and strategies needed to overcome common obstacles which may arise along the way.  From Head to Heart: The Power of a Healthy Outlook  Clinical staff led group instruction and group discussion with PowerPoint presentation and patient guidebook. To enhance the learning environment the use of posters, models and videos may be added. Patients will be able to recognize and describe the impact of emotions and mood on physical health. They will discover the importance of self-care and explore self-care practices which may work for them. Patients will also learn how to utilize the 4 C's to cultivate a healthier outlook and better manage stress and challenges. The purpose of this lesson is to demonstrate to patients how a healthy outlook is an essential part of maintaining good health, especially as they continue their cardiac rehab journey.  Healthy Sleep for a Healthy Heart Clinical staff led group instruction and group discussion with PowerPoint presentation and patient guidebook. To enhance the learning environment the use of posters, models and videos may be added. At the conclusion of this workshop, patients will be able to demonstrate knowledge of the importance of sleep to overall health, well-being, and quality of life. They will understand the symptoms of, and treatments  for, common sleep disorders. Patients will also be able to identify daytime and nighttime behaviors which impact sleep, and they will be able to apply these tools to help manage sleep-related challenges. The purpose of this lesson is to provide patients with a general overview of sleep and outline the importance of quality sleep. Patients will learn about a few of the most common sleep disorders. Patients will  also be introduced to the concept of "sleep hygiene," and discover ways to self-manage certain sleeping problems through simple daily behavior changes. Finally, the workshop will motivate patients by clarifying the links between quality sleep and their goals of heart-healthy living.   Recognizing and Reducing Stress Clinical staff led group instruction and group discussion with PowerPoint presentation and patient guidebook. To enhance the learning environment the use of posters, models and videos may be added. At the conclusion of this workshop, patients will be able to understand the types of stress reactions, differentiate between acute and chronic stress, and recognize the impact that chronic stress has on their health. They will also be able to apply different coping mechanisms, such as reframing negative self-talk. Patients will have the opportunity to practice a variety of stress management techniques, such as deep abdominal breathing, progressive muscle relaxation, and/or guided imagery.  The purpose of this lesson is to educate patients on the role of stress in their lives and to provide healthy techniques for coping with it.  Learning Barriers/Preferences:  Learning Barriers/Preferences - 02/26/24 1150       Learning Barriers/Preferences   Learning Barriers Sight   reading glasses   Learning Preferences Group Instruction;Individual Instruction;Skilled Demonstration          Education Topics:  Knowledge Questionnaire Score:  Knowledge Questionnaire Score - 02/26/24 1151       Knowledge Questionnaire Score   Pre Score 18/24          Core Components/Risk Factors/Patient Goals at Admission:  Personal Goals and Risk Factors at Admission - 02/26/24 1152       Core Components/Risk Factors/Patient Goals on Admission    Weight Management Obesity;Weight Loss    Hypertension Yes    Intervention Provide education on lifestyle modifcations including regular physical  activity/exercise, weight management, moderate sodium restriction and increased consumption of fresh fruit, vegetables, and low fat dairy, alcohol moderation, and smoking cessation.;Monitor prescription use compliance.    Expected Outcomes Short Term: Continued assessment and intervention until BP is < 140/40mm HG in hypertensive participants. < 130/66mm HG in hypertensive participants with diabetes, heart failure or chronic kidney disease.;Long Term: Maintenance of blood pressure at goal levels.    Lipids Yes    Intervention Provide education and support for participant on nutrition & aerobic/resistive exercise along with prescribed medications to achieve LDL 70mg , HDL >40mg .    Expected Outcomes Short Term: Participant states understanding of desired cholesterol values and is compliant with medications prescribed. Participant is following exercise prescription and nutrition guidelines.;Long Term: Cholesterol controlled with medications as prescribed, with individualized exercise RX and with personalized nutrition plan. Value goals: LDL < 70mg , HDL > 40 mg.    Stress Yes    Intervention Offer individual and/or small group education and counseling on adjustment to heart disease, stress management and health-related lifestyle change. Teach and support self-help strategies.;Refer participants experiencing significant psychosocial distress to appropriate mental health specialists for further evaluation and treatment. When possible, include family members and significant others in education/counseling sessions.    Expected Outcomes Short Term: Participant demonstrates changes in health-related behavior,  relaxation and other stress management skills, ability to obtain effective social support, and compliance with psychotropic medications if prescribed.;Long Term: Emotional wellbeing is indicated by absence of clinically significant psychosocial distress or social isolation.          Core Components/Risk  Factors/Patient Goals Review:   Goals and Risk Factor Review     Row Name 03/03/24 1438 03/07/24 1447           Core Components/Risk Factors/Patient Goals Review   Personal Goals Review Hypertension;Lipids;Stress Hypertension;Lipids;Stress      Review Pt started exercise today (03/03/24). VSS. Tolerated exercise well. Tolerated exercise well. Pt doing well with exercise at cardiac rehab. Vital signs have been stable. [name] has increased his met levels.      Expected Outcomes Pt will continue to participate in cardiac rehab for exercise, nutrition, and lifestyle modifications Pt will continue to participate in cardiac rehab for exercise, nutrition, and lifestyle modifications         Core Components/Risk Factors/Patient Goals at Discharge (Final Review):   Goals and Risk Factor Review - 03/07/24 1447       Core Components/Risk Factors/Patient Goals Review   Personal Goals Review Hypertension;Lipids;Stress    Review Tolerated exercise well. Pt doing well with exercise at cardiac rehab. Vital signs have been stable. [name] has increased his met levels.    Expected Outcomes Pt will continue to participate in cardiac rehab for exercise, nutrition, and lifestyle modifications          ITP Comments:  ITP Comments     Row Name 02/26/24 1050 03/03/24 1431 03/07/24 1444       ITP Comments Dr Wilbert Bihari MD, Medical Director. Introduction to P[employer]ik[employer] Education program/ Intensive Cardiac Rehab. Initial Orientation Packet reviewed with the patient. 30 Day ITP Review. Pt started cardiac rehab today (03/03/24). Tolerated exercise well. 30 Day ITP Review. [name] has good attendance and participation with exercise in cardiac rehab.        Comments: see ITP comments

## 2024-03-10 ENCOUNTER — Encounter (HOSPITAL_COMMUNITY)
Admission: RE | Admit: 2024-03-10 | Discharge: 2024-03-10 | Disposition: A | Discharge status: Home / Self Care | Source: Ambulatory Visit | Attending: Cardiology

## 2024-03-10 DIAGNOSIS — Z48812 Encounter for surgical aftercare following surgery on the circulatory system: Secondary | ICD-10-CM | POA: Diagnosis not present

## 2024-03-10 DIAGNOSIS — Z952 Presence of prosthetic heart valve: Secondary | ICD-10-CM

## 2024-03-12 ENCOUNTER — Encounter (HOSPITAL_COMMUNITY)
Admission: RE | Admit: 2024-03-12 | Discharge: 2024-03-12 | Disposition: A | Discharge status: Home / Self Care | Source: Ambulatory Visit | Attending: Cardiology | Admitting: Cardiology

## 2024-03-12 DIAGNOSIS — Z952 Presence of prosthetic heart valve: Secondary | ICD-10-CM

## 2024-03-12 DIAGNOSIS — Z48812 Encounter for surgical aftercare following surgery on the circulatory system: Secondary | ICD-10-CM | POA: Diagnosis not present

## 2024-03-13 ENCOUNTER — Other Ambulatory Visit (HOSPITAL_COMMUNITY)

## 2024-03-13 ENCOUNTER — Ambulatory Visit: Admitting: Physician Assistant

## 2024-03-14 ENCOUNTER — Encounter (HOSPITAL_COMMUNITY)
Admission: RE | Admit: 2024-03-14 | Discharge: 2024-03-14 | Disposition: A | Discharge status: Home / Self Care | Source: Ambulatory Visit | Attending: Cardiology | Admitting: Cardiology

## 2024-03-14 DIAGNOSIS — Z952 Presence of prosthetic heart valve: Secondary | ICD-10-CM

## 2024-03-14 DIAGNOSIS — Z48812 Encounter for surgical aftercare following surgery on the circulatory system: Secondary | ICD-10-CM | POA: Diagnosis not present

## 2024-03-17 ENCOUNTER — Other Ambulatory Visit: Payer: Self-pay | Admitting: Allergy and Immunology

## 2024-03-17 ENCOUNTER — Encounter (HOSPITAL_COMMUNITY)
Admission: RE | Admit: 2024-03-17 | Discharge: 2024-03-17 | Disposition: A | Discharge status: Home / Self Care | Source: Ambulatory Visit | Attending: Cardiology | Admitting: Cardiology

## 2024-03-17 DIAGNOSIS — Z48812 Encounter for surgical aftercare following surgery on the circulatory system: Secondary | ICD-10-CM | POA: Diagnosis not present

## 2024-03-17 DIAGNOSIS — Z952 Presence of prosthetic heart valve: Secondary | ICD-10-CM

## 2024-03-19 ENCOUNTER — Encounter (HOSPITAL_COMMUNITY)
Admission: RE | Admit: 2024-03-19 | Discharge: 2024-03-19 | Disposition: A | Discharge status: Home / Self Care | Source: Ambulatory Visit | Attending: Cardiology

## 2024-03-19 DIAGNOSIS — Z952 Presence of prosthetic heart valve: Secondary | ICD-10-CM

## 2024-03-19 DIAGNOSIS — Z48812 Encounter for surgical aftercare following surgery on the circulatory system: Secondary | ICD-10-CM | POA: Diagnosis not present

## 2024-03-20 ENCOUNTER — Ambulatory Visit: Admitting: Physician Assistant

## 2024-03-20 ENCOUNTER — Ambulatory Visit (HOSPITAL_COMMUNITY)
Admission: RE | Admit: 2024-03-20 | Discharge: 2024-03-20 | Disposition: A | Discharge status: Home / Self Care | Source: Ambulatory Visit | Attending: Cardiology | Admitting: Cardiology

## 2024-03-20 VITALS — BP 134/68 | HR 56 | Ht 60.5 in | Wt 161.2 lb

## 2024-03-20 DIAGNOSIS — I2510 Atherosclerotic heart disease of native coronary artery without angina pectoris: Secondary | ICD-10-CM | POA: Insufficient documentation

## 2024-03-20 DIAGNOSIS — I10 Essential (primary) hypertension: Secondary | ICD-10-CM | POA: Insufficient documentation

## 2024-03-20 DIAGNOSIS — Z952 Presence of prosthetic heart valve: Secondary | ICD-10-CM | POA: Insufficient documentation

## 2024-03-20 LAB — ECHOCARDIOGRAM COMPLETE
AR max vel: 1.26 cm2
AV Area VTI: 1.49 cm2
AV Area mean vel: 1.43 cm2
AV Mean grad: 17 mmHg
AV Peak grad: 30.5 mmHg
Ao pk vel: 2.76 m/s
Area-P 1/2: 3.99 cm2
S' Lateral: 2.4 cm

## 2024-03-20 NOTE — Progress Notes (Unsigned)
 [facility] V[facility]ENTER   MULTIDISCIPLINARY HEART VALVE CLINIC                                     Cardiology Office Note:    Date:  03/21/2024   ID:  JI FAIRBURN, DOB 10/17/1955, MRN [id number]  PCP:  [name], NP  [facility] Cardiologist:  Newman JINNY Lawrence, MD  [facility] Structural heart: Ozell Fell, MD [facility] Electrophysiologist:  None   Referring MD: [name], NP   [facility] s/p TAVR  History of Present Illness:    Taylor Stanley is a 68 y.o. female with a hx of HTN, HLD, RA on golimumab immunotherapy, non obst CAD, obesity (BMI 31) and severe AS s/p TAVR (01/29/24) who presents to clinic for follow up.   She developed exertional SOB and fatigue. Echocardiogram 11/02/2023 showed EF 70% and severe AS with a mean grad 33 mmHg, Vmax 3.98 m/s, AVA 0.84 cm2, and mild AI/MR. L/RHC 11/26/23 showed moderate nonobstructive coronary disease with a 50% proximal RCA stenosis. S/p TAVR with a 23 mm Edwards Sapien 3 Ultra Resilia THV via the TF approach on 01/29/24. Post operative echo showed EF 60%, normally functioning TAVR with a mean gradient of 9 mmHg and trivial PVL. She did have shaking chills and a low-grade temperature up to 100 but improved on POD1 and preferred discharge home. She was discharged on aspirin  81mg  daily. Given normotensive BPs her home Norvasc  5mg  daily was held at discharge.   Today the patient presents to clinic for follow up. Here alone. She had a nice time at the beach. No CP. Still has some mild exertional SOB but it has improved since TAVR. No LE edema, orthopnea or PND. No dizziness or syncope. No blood in stool or urine. No palpitations. Has issues with legs aching at night, L>R. She has femoral area leg pain when walking that is revlieved with rest. ABIs were normal in 10/2023.    Past Medical History:  Diagnosis Date   Anxiety    Asthma    triggered by allergies; does not use inhaler daily   Carpal tunnel syndrome of right  wrist 11/2011   Dental crowns present    Depression    GERD (gastroesophageal reflux disease)    Hx of colonic polyps    Hypertension    under control; has been on med. x 5 yrs.   Osteopenia    PONV (postoperative nausea and vomiting)    S/P TAVR (transcatheter aortic valve replacement) 01/29/2024   s/p TAVR with a 23 mm Edwards S3UR via the TF approach by Dr. Fell and Dr. Lucas   Seasonal allergies    Seizure [facility]) as an infant   x 1 - unknown cause   Snores    denies apnea   TMJ disease    Wears glasses      Current Medications: Current Meds  Medication Sig   albuterol  (VENTOLIN  HFA) 108 (90 Base) MCG/ACT inhaler TAKE 2 PUFFS BY MOUTH EVERY 4 HOURS AS NEEDED   ascorbic acid (VITAMIN C) 500 MG tablet Take 1,000 mg by mouth daily.   aspirin  EC 81 MG tablet Take 1 tablet (81 mg total) by mouth daily. Swallow whole.   azithromycin  (ZITHROMAX ) 500 MG tablet Take 1 tablet (500 mg total) by mouth as directed. Take one tablet 1 hour before any dental work including cleanings.  carvedilol  (COREG ) 3.125 MG tablet Take 1 tablet (3.125 mg total) by mouth 2 (two) times daily with a meal.   cholecalciferol (VITAMIN D) 1000 UNITS tablet Take 1,000 Units by mouth in the morning.   clobetasol cream (TEMOVATE) 0.05 % Apply 1 Application topically 2 (two) times daily as needed (rash).   desloratadine  (CLARINEX ) 5 MG tablet TAKE 1 TABLET BY MOUTH DAILY AS NEEDED   esomeprazole  (NEXIUM ) 40 MG capsule TAKE 1 CAPSULE BY MOUTH EVERY MORNING   fluticasone  (FLONASE ) 50 MCG/ACT nasal spray SPRAY 1-2 SPRAYS INTO EACH NOSTRIL 1-7 times per week.   Fluticasone  Furoate (ARNUITY ELLIPTA ) 200 MCG/ACT AEPB INHALE 1 PUFF BY MOUTH DAILY   Golimumab (SIMPONI ARIA IV) Inject 1 Dose into the vein every 2 (two) months.   ibuprofen (ADVIL) 200 MG tablet Take 200 mg by mouth every 6 (six) hours as needed for moderate pain (pain score 4-6).   nitroGLYCERIN  (NITROSTAT ) 0.4 MG SL tablet Place 1 tablet (0.4 mg total)  under the tongue every 5 (five) minutes as needed for chest pain.   olmesartan-hydrochlorothiazide (BENICAR HCT) 40-25 MG tablet Take 1 tablet by mouth in the morning.   rosuvastatin  (CRESTOR ) 20 MG tablet Take 1 tablet (20 mg total) by mouth daily.   sertraline  (ZOLOFT ) 100 MG tablet Take 50 mg by mouth at bedtime.   triamcinolone cream (KENALOG) 0.1 % Apply 1 Application topically 2 (two) times daily as needed (skin irritation).      ROS:   Please see the history of present illness.    All other systems reviewed and are negative.  EKGs       Risk Assessment/Calculations:           Physical Exam:    VS:  BP 134/68   Pulse (!) 56   Ht 5' 0.5 (1.537 m)   Wt 161 lb 3.2 oz (73.1 kg)   SpO2 97%   BMI 30.96 kg/m     Wt Readings from Last 3 Encounters:  03/20/24 161 lb 3.2 oz (73.1 kg)  02/26/24 164 lb 0.4 oz (74.4 kg)  02/04/24 160 lb 9.6 oz (72.8 kg)     GEN: Well nourished, well developed in no acute distress NECK: No JVD CARDIAC: RRR, soft flow murmur. N, rubs, gallops RESPIRATORY:  Clear to auscultation without rales, wheezing or rhonchi  ABDOMEN: Soft, non-tender, non-distended EXTREMITIES:  No edema; No deformity.    ASSESSMENT:    1. S/P TAVR (transcatheter aortic valve replacement)   2. Essential hypertension   3. Coronary artery disease involving native coronary artery of native heart without angina pectoris     PLAN:    In order of problems listed above:  Severe AS s/p TAVR:  -- Echo today shows EF 60%, mild MR and normally functioning TAVR with a mean gradient of 17 mm hg and no PVL. -- HYHA class II symptoms but with an improvement in breathing since TAVR.  -- SBE prophylaxis discussed; I have RX'd azithromycin  due to a PCN allergy. .  -- Continue Aspirin  81mg  daily.SABRA  -- I will see back for 1 year echo and OV.  HTN: -- BP well controlled today. -- Continue Coreg  3.125mg  BID & Benicar HCT 40-25mg  daily.  -- Amlodipine  5mg  daily held at  discharge given normotensive BPs- will keep her off this for now.  CAD: -- [facility] 11/26/23 showed moderate nonobstructive coronary disease with a 50% proximal RCA stenosis.  -- Continue Asprin 81mg  daily.  -- Continue Crestor  20mg  daily.  Medication Adjustments/Labs and Tests Ordered: Current medicines are reviewed at length with the patient today.  Concerns regarding medicines are outlined above.  Orders Placed This Encounter  Procedures   ECHOCARDIOGRAM COMPLETE   No orders of the defined types were placed in this encounter.   Patient Instructions  Medication Instructions:  Your physician recommends that you continue on your current medications as directed. Please refer to the Current Medication list given to you today.  *If you need a refill on your cardiac medications before your next appointment, please call your pharmacy*  Lab Work: None needed If you have labs (blood work) drawn today and your tests are completely normal, you will receive your results only by: MyChart Message (if you have MyChart) OR A paper copy in the mail If you have any lab test that is abnormal or we need to change your treatment, we will call you to review the results.  Testing/Procedures: 01/2025 Your physician has requested that you have an echocardiogram. Echocardiography is a painless test that uses sound waves to create images of your heart. It provides your doctor with information about the size and shape of your heart and how well your heart's chambers and valves are working. This procedure takes approximately one hour. There are no restrictions for this procedure. Please do NOT wear cologne, perfume, aftershave, or lotions (deodorant is allowed). Please arrive 15 minutes prior to your appointment time.  Please note: We ask at that you not bring children with you during ultrasound (echo/ vascular) testing. Due to room size and safety concerns, children are not allowed in the ultrasound rooms during  exams. Our front office staff cannot provide observation of children in our lobby area while testing is being conducted. An adult accompanying a patient to their appointment will only be allowed in the ultrasound room at the discretion of the ultrasound technician under special circumstances. We apologize for any inconvenience.   Follow-Up: At [facility], you and your health needs are our priority.  As part of our continuing mission to provide you with exceptional heart care, our providers are all part of one team.  This team includes your primary Cardiologist (physician) and Advanced Practice Providers or APPs (Physician Assistants and Nurse Practitioners) who all work together to provide you with the care you need, when you need it.  Your next appointment:   As scheduled  Provider:   Izetta Hummer, PA-C    We recommend signing up for the patient portal called MyChart.  Sign up information is provided on this After Visit Summary.  MyChart is used to connect with patients for Virtual Visits (Telemedicine).  Patients are able to view lab/test results, encounter notes, upcoming appointments, etc.  Non-urgent messages can be sent to your provider as well.   To learn more about what you can do with MyChart, go to [email or web address].        Signed, Lamarr Hummer, PA-C  03/21/2024 12:12 AM    [facility]

## 2024-03-20 NOTE — Patient Instructions (Signed)
 Medication Instructions:  Your physician recommends that you continue on your current medications as directed. Please refer to the Current Medication list given to you today.  *If you need a refill on your cardiac medications before your next appointment, please call your pharmacy*  Lab Work: None needed If you have labs (blood work) drawn today and your tests are completely normal, you will receive your results only by: MyChart Message (if you have MyChart) OR A paper copy in the mail If you have any lab test that is abnormal or we need to change your treatment, we will call you to review the results.  Testing/Procedures: 01/2025 Your physician has requested that you have an echocardiogram. Echocardiography is a painless test that uses sound waves to create images of your heart. It provides your doctor with information about the size and shape of your heart and how well your heart's chambers and valves are working. This procedure takes approximately one hour. There are no restrictions for this procedure. Please do NOT wear cologne, perfume, aftershave, or lotions (deodorant is allowed). Please arrive 15 minutes prior to your appointment time.  Please note: We ask at that you not bring children with you during ultrasound (echo/ vascular) testing. Due to room size and safety concerns, children are not allowed in the ultrasound rooms during exams. Our front office staff cannot provide observation of children in our lobby area while testing is being conducted. An adult accompanying a patient to their appointment will only be allowed in the ultrasound room at the discretion of the ultrasound technician under special circumstances. We apologize for any inconvenience.   Follow-Up: At [facility], you and your health needs are our priority.  As part of our continuing mission to provide you with exceptional heart care, our providers are all part of one team.  This team includes your primary  Cardiologist (physician) and Advanced Practice Providers or APPs (Physician Assistants and Nurse Practitioners) who all work together to provide you with the care you need, when you need it.  Your next appointment:   As scheduled  Provider:   Izetta Hummer, PA-C    We recommend signing up for the patient portal called MyChart.  Sign up information is provided on this After Visit Summary.  MyChart is used to connect with patients for Virtual Visits (Telemedicine).  Patients are able to view lab/test results, encounter notes, upcoming appointments, etc.  Non-urgent messages can be sent to your provider as well.   To learn more about what you can do with MyChart, go to [email or web address].

## 2024-03-21 ENCOUNTER — Encounter (HOSPITAL_COMMUNITY)
Admission: RE | Admit: 2024-03-21 | Discharge: 2024-03-21 | Disposition: A | Discharge status: Home / Self Care | Source: Ambulatory Visit | Attending: Cardiology | Admitting: Cardiology

## 2024-03-21 ENCOUNTER — Ambulatory Visit: Payer: Self-pay | Admitting: Physician Assistant

## 2024-03-21 DIAGNOSIS — Z48812 Encounter for surgical aftercare following surgery on the circulatory system: Secondary | ICD-10-CM | POA: Diagnosis not present

## 2024-03-21 DIAGNOSIS — Z952 Presence of prosthetic heart valve: Secondary | ICD-10-CM

## 2024-03-24 ENCOUNTER — Encounter (HOSPITAL_COMMUNITY)
Admission: RE | Admit: 2024-03-24 | Discharge: 2024-03-24 | Disposition: A | Discharge status: Home / Self Care | Source: Ambulatory Visit | Attending: Cardiology | Admitting: Cardiology

## 2024-03-24 DIAGNOSIS — Z952 Presence of prosthetic heart valve: Secondary | ICD-10-CM

## 2024-03-24 DIAGNOSIS — Z48812 Encounter for surgical aftercare following surgery on the circulatory system: Secondary | ICD-10-CM | POA: Diagnosis not present

## 2024-03-26 ENCOUNTER — Encounter (HOSPITAL_COMMUNITY)
Admission: RE | Admit: 2024-03-26 | Discharge: 2024-03-26 | Disposition: A | Discharge status: Home / Self Care | Source: Ambulatory Visit | Attending: Cardiology | Admitting: Cardiology

## 2024-03-26 DIAGNOSIS — Z48812 Encounter for surgical aftercare following surgery on the circulatory system: Secondary | ICD-10-CM | POA: Diagnosis not present

## 2024-03-26 DIAGNOSIS — Z952 Presence of prosthetic heart valve: Secondary | ICD-10-CM

## 2024-03-28 ENCOUNTER — Encounter (HOSPITAL_COMMUNITY)
Admission: RE | Admit: 2024-03-28 | Discharge: 2024-03-28 | Disposition: A | Discharge status: Home / Self Care | Source: Ambulatory Visit | Attending: Cardiology | Admitting: Cardiology

## 2024-03-28 DIAGNOSIS — Z952 Presence of prosthetic heart valve: Secondary | ICD-10-CM

## 2024-03-28 DIAGNOSIS — Z48812 Encounter for surgical aftercare following surgery on the circulatory system: Secondary | ICD-10-CM | POA: Diagnosis not present

## 2024-04-02 ENCOUNTER — Encounter (HOSPITAL_COMMUNITY)
Admission: RE | Admit: 2024-04-02 | Discharge: 2024-04-02 | Disposition: A | Discharge status: Home / Self Care | Source: Ambulatory Visit | Attending: Cardiology | Admitting: Cardiology

## 2024-04-02 DIAGNOSIS — Z952 Presence of prosthetic heart valve: Secondary | ICD-10-CM | POA: Diagnosis present

## 2024-04-02 DIAGNOSIS — Z48812 Encounter for surgical aftercare following surgery on the circulatory system: Secondary | ICD-10-CM | POA: Insufficient documentation

## 2024-04-04 ENCOUNTER — Telehealth (HOSPITAL_COMMUNITY): Payer: Self-pay

## 2024-04-04 ENCOUNTER — Encounter (HOSPITAL_COMMUNITY)
Admission: RE | Admit: 2024-04-04 | Discharge: 2024-04-04 | Disposition: A | Discharge status: Home / Self Care | Source: Ambulatory Visit | Attending: Cardiology | Admitting: Cardiology

## 2024-04-04 DIAGNOSIS — Z48812 Encounter for surgical aftercare following surgery on the circulatory system: Secondary | ICD-10-CM | POA: Diagnosis not present

## 2024-04-04 DIAGNOSIS — Z952 Presence of prosthetic heart valve: Secondary | ICD-10-CM

## 2024-04-04 NOTE — Telephone Encounter (Signed)
 Patient informed front desk she will be out of town from 9/08 to 9/19 and return to cardiac rehab on 9/22. Her schedule has been updated.

## 2024-04-07 ENCOUNTER — Encounter (HOSPITAL_COMMUNITY)

## 2024-04-08 NOTE — Progress Notes (Signed)
 Cardiac Individual Treatment Plan  Patient Details  Name: Taylor Stanley MRN: [id number] Date of Birth: [birth year] Referring Provider:   Flowsheet Row INTENSIVE CARDIAC REHAB ORIENT from 02/26/2024 in [facility]  Referring Provider Newman Lawrence, MD    Initial Encounter Date:  Flowsheet Row INTENSIVE CARDIAC REHAB ORIENT from 02/26/2024 in [facility]  Date 02/26/24    Visit Diagnosis: 01/29/24 S/P TAVR  Patient's Home Medications on Admission:  Current Outpatient Medications:    albuterol  (VENTOLIN  HFA) 108 (90 Base) MCG/ACT inhaler, TAKE 2 PUFFS BY MOUTH EVERY 4 HOURS AS NEEDED, Disp: 18 g, Rfl: 2   ascorbic acid (VITAMIN C) 500 MG tablet, Take 1,000 mg by mouth daily., Disp: , Rfl:    aspirin  EC 81 MG tablet, Take 1 tablet (81 mg total) by mouth daily. Swallow whole., Disp: 90 tablet, Rfl: 2   azithromycin  (ZITHROMAX ) 500 MG tablet, Take 1 tablet (500 mg total) by mouth as directed. Take one tablet 1 hour before any dental work including cleanings., Disp: 6 tablet, Rfl: 12   carvedilol  (COREG ) 3.125 MG tablet, Take 1 tablet (3.125 mg total) by mouth 2 (two) times daily with a meal., Disp: 180 tablet, Rfl: 2   cholecalciferol (VITAMIN D) 1000 UNITS tablet, Take 1,000 Units by mouth in the morning., Disp: , Rfl:    clobetasol cream (TEMOVATE) 0.05 %, Apply 1 Application topically 2 (two) times daily as needed (rash)., Disp: , Rfl:    desloratadine  (CLARINEX ) 5 MG tablet, TAKE 1 TABLET BY MOUTH DAILY AS NEEDED, Disp: 90 tablet, Rfl: 1   esomeprazole  (NEXIUM ) 40 MG capsule, TAKE 1 CAPSULE BY MOUTH EVERY MORNING, Disp: 90 capsule, Rfl: 2   fluticasone  (FLONASE ) 50 MCG/ACT nasal spray, SPRAY 1-2 SPRAYS INTO EACH NOSTRIL 1-7 times per week., Disp: 16 mL, Rfl: 5   Fluticasone  Furoate (ARNUITY ELLIPTA ) 200 MCG/ACT AEPB, INHALE 1 PUFF BY MOUTH DAILY, Disp: 30 each, Rfl: 11    Golimumab (SIMPONI ARIA IV), Inject 1 Dose into the vein every 2 (two) months., Disp: , Rfl:    ibuprofen (ADVIL) 200 MG tablet, Take 200 mg by mouth every 6 (six) hours as needed for moderate pain (pain score 4-6)., Disp: , Rfl:    nitroGLYCERIN  (NITROSTAT ) 0.4 MG SL tablet, Place 1 tablet (0.4 mg total) under the tongue every 5 (five) minutes as needed for chest pain., Disp: 25 tablet, Rfl: 4   olmesartan-hydrochlorothiazide (BENICAR HCT) 40-25 MG tablet, Take 1 tablet by mouth in the morning., Disp: , Rfl:    rosuvastatin  (CRESTOR ) 20 MG tablet, Take 1 tablet (20 mg total) by mouth daily., Disp: 90 tablet, Rfl: 2   sertraline  (ZOLOFT ) 100 MG tablet, Take 50 mg by mouth at bedtime., Disp: , Rfl:    triamcinolone cream (KENALOG) 0.1 %, Apply 1 Application topically 2 (two) times daily as needed (skin irritation)., Disp: , Rfl:   Past Medical History: Past Medical History:  Diagnosis Date   Anxiety    Asthma    triggered by allergies; does not use inhaler daily   Carpal tunnel syndrome of right wrist 11/2011   Dental crowns present    Depression    GERD (gastroesophageal reflux disease)    Hx of colonic polyps    Hypertension    under control; has been on med. x 5 yrs.   Osteopenia    PONV (postoperative nausea and vomiting)  S/P TAVR (transcatheter aortic valve replacement) 01/29/2024   s/p TAVR with a 23 mm Edwards S3UR via the TF approach by Dr. Wonda and Dr. Lucas   Seasonal allergies    Seizure [facility]) as an infant   x 1 - unknown cause   Snores    denies apnea   TMJ disease    Wears glasses     Tobacco Use: Social History   Tobacco Use  Smoking Status Former   Current packs/day: 0.00   Average packs/day: 1 pack/day for 13.0 years (13.0 ttl pk-yrs)   Types: Cigarettes   Start date: 02/11/1992   Quit date: 02/10/2005   Years since quitting: 19.1  Smokeless Tobacco Never    Labs: Review Flowsheet  More data exists      Latest Ref Rng & Units 06/23/2013  02/22/2022 10/20/2022 11/26/2023 01/29/2024  Labs for ITP Cardiac and Pulmonary Rehab  Cholestrol 100 - 199 mg/dL 839  94  889  - -  LDL (calc) 0 - 99 mg/dL 892  41  57  - -  HDL-C >39 mg/dL 69.69  33  36  - -  Trlycerides 0 - 149 mg/dL 886.9  895  86  - -  PH, Arterial 7.35 - 7.45 - - - 7.418  -  PCO2 arterial 32 - 48 mmHg - - - 40.2  -  Bicarbonate 20.0 - 28.0 mmol/L - - - 27.7  25.9  28.2  -  TCO2 22 - 32 mmol/L - - - 29  27  30  26    O2 Saturation % - - - 76  96  75  -    Details       Multiple values from one day are sorted in reverse-chronological order          Exercise Target Goals: Exercise Program Goal: Individual exercise prescription set using results from initial 6 min walk test and THRR while considering  patient's activity barriers and safety.   Exercise Prescription Goal: Initial exercise prescription builds to 30-45 minutes a day of aerobic activity, 2-3 days per week.  Home exercise guidelines will be given to patient during program as part of exercise prescription that the participant will acknowledge.   Education: Aerobic Exercise: - Group verbal and visual presentation on the components of exercise prescription. Introduces F.I.T.T principle from ACSM for exercise prescriptions.  Reviews F.I.T.T. principles of aerobic exercise including progression. Written material provided at class time.   Education: Resistance Exercise: - Group verbal and visual presentation on the components of exercise prescription. Introduces F.I.T.T principle from ACSM for exercise prescriptions  Reviews F.I.T.T. principles of resistance exercise including progression. Written material provided at class time.    Education: Exercise & Equipment Safety: - Individual verbal instruction and demonstration of equipment use and safety with use of the equipment.   Education: Exercise Physiology & General Exercise Guidelines: - Group verbal and written instruction with models to review the  exercise physiology of the cardiovascular system and associated critical values. Provides general exercise guidelines with specific guidelines to those with heart or lung disease. Written material provided at class time.   Education: Flexibility, Balance, Mind/Body Relaxation: - Group verbal and visual presentation with interactive activity on the components of exercise prescription. Introduces F.I.T.T principle from ACSM for exercise prescriptions. Reviews F.I.T.T. principles of flexibility and balance exercise training including progression. Also discusses the mind body connection.  Reviews various relaxation techniques to help reduce and manage stress (i.e. Deep breathing, progressive muscle relaxation, and  visualization). Balance handout provided to take home. Written material provided at class time.   Activity Barriers & Risk Stratification:  Activity Barriers & Cardiac Risk Stratification - 02/26/24 1311       Activity Barriers & Cardiac Risk Stratification   Activity Barriers Arthritis;Joint Problems;Balance Concerns;Shortness of Breath;Neck/Spine Problems;Back Problems    Cardiac Risk Stratification High          6 Minute Walk:  6 Minute Walk     Row Name 02/26/24 1310         6 Minute Walk   Phase Initial     Distance 1514 feet     Walk Time 6 minutes     # of Rest Breaks 0     MPH 2.9     METS 2.9     RPE 7     Perceived Dyspnea  1     VO2 Peak 10.2     Symptoms Yes (comment)     Comments Mild SOB, RPD = 1, Chronic left hip pain 2/10     Resting HR 62 bpm     Resting BP 138/76     Resting Oxygen Saturation  95 %     Exercise Oxygen Saturation  during 6 min walk 96 %     Max Ex. HR 82 bpm     Max Ex. BP 152/80     2 Minute Post BP 130/80        Oxygen Initial Assessment:   Oxygen Re-Evaluation:   Oxygen Discharge (Final Oxygen Re-Evaluation):   Initial Exercise Prescription:  Initial Exercise Prescription - 02/26/24 1300       Date of Initial  Exercise RX and Referring Provider   Date 02/26/24    Referring Provider Newman Lawrence, MD    Expected Discharge Date 05/14/24      NuStep   Level 2    SPM 75    Minutes 15    METs 2.9      Recumbant Elliptical   Level 2    RPM 60    Watts 75    Minutes 15    METs 2.9      Prescription Details   Frequency (times per week) 3    Duration Progress to 30 minutes of continuous aerobic without signs/symptoms of physical distress      Intensity   THRR 40-80% of Max Heartrate 60-120    Ratings of Perceived Exertion 11-13    Perceived Dyspnea 0-4      Progression   Progression Continue progressive overload as per policy without signs/symptoms or physical distress.      Resistance Training   Training Prescription Yes    Weight 3 lbs    Reps 10-15          Perform Capillary Blood Glucose checks as needed.  Exercise Prescription Changes:   Exercise Prescription Changes     Row Name 03/03/24 1400 04/02/24 1500           Response to Exercise   Blood Pressure (Admit) 134/70 132/72      Blood Pressure (Exercise) 164/82 --      Blood Pressure (Exit) 122/62 108/68      Heart Rate (Admit) 59 bpm 54 bpm      Heart Rate (Exercise) 80 bpm 90 bpm      Heart Rate (Exit) 63 bpm 63 bpm      Rating of Perceived Exertion (Exercise) 13 11      Symptoms None None  Comments Pt's first day in the CRP2 program Reviewed METs      Duration Continue with 30 min of aerobic exercise without signs/symptoms of physical distress. Continue with 30 min of aerobic exercise without signs/symptoms of physical distress.      Intensity THRR unchanged THRR unchanged        Progression   Progression Continue to progress workloads to maintain intensity without signs/symptoms of physical distress. Continue to progress workloads to maintain intensity without signs/symptoms of physical distress.      Average METs 2.6 3.4        Resistance Training   Training Prescription Yes No      Weight 3  lbs No wts on Wednesdays      Reps 10-15 --      Time 5 Minutes --        Interval Training   Interval Training No No        NuStep   Level 2 3      SPM 77 99      Minutes 15 15      METs 2.2 3.2        Recumbant Elliptical   Level 2 --      RPM 150 --      Watts 64 --      Minutes 15 --      METs 3 --        Track   Laps -- 20      Minutes -- 15      METs -- 3.55         Exercise Comments:   Exercise Comments     Row Name 03/03/24 1422 04/02/24 0150 04/02/24 1500       Exercise Comments Pt's first day in the CRP2 program. Pt tolerated initial session well. Pt did complain that she found the seat on the Octane uncomfortable on her buttocks; will reassess next session. -- Reviewed METs. Pt is progressing well. Pt dropped Octance because it was uncomfortable for her back, using track and doing very well.        Exercise Goals and Review:   Exercise Goals     Row Name 02/26/24 1314             Exercise Goals   Increase Physical Activity Yes       Intervention Provide advice, education, support and counseling about physical activity/exercise needs.;Develop an individualized exercise prescription for aerobic and resistive training based on initial evaluation findings, risk stratification, comorbidities and participant's personal goals.       Expected Outcomes Long Term: Exercising regularly at least 3-5 days a week.;Short Term: Attend rehab on a regular basis to increase amount of physical activity.;Long Term: Add in home exercise to make exercise part of routine and to increase amount of physical activity.       Increase Strength and Stamina Yes       Intervention Provide advice, education, support and counseling about physical activity/exercise needs.;Develop an individualized exercise prescription for aerobic and resistive training based on initial evaluation findings, risk stratification, comorbidities and participant's personal goals.       Expected Outcomes  Short Term: Increase workloads from initial exercise prescription for resistance, speed, and METs.;Short Term: Perform resistance training exercises routinely during rehab and add in resistance training at home;Long Term: Improve cardiorespiratory fitness, muscular endurance and strength as measured by increased METs and functional capacity ( )       Able to understand and use rate of  perceived exertion (RPE) scale Yes       Intervention Provide education and explanation on how to use RPE scale       Expected Outcomes Short Term: Able to use RPE daily in rehab to express subjective intensity level;Long Term:  Able to use RPE to guide intensity level when exercising independently       Knowledge and understanding of Target Heart Rate Range (THRR) Yes       Intervention Provide education and explanation of THRR including how the numbers were predicted and where they are located for reference       Expected Outcomes Long Term: Able to use THRR to govern intensity when exercising independently;Short Term: Able to use daily as guideline for intensity in rehab;Short Term: Able to state/look up THRR       Understanding of Exercise Prescription Yes       Intervention Provide education, explanation, and written materials on patient's individual exercise prescription       Expected Outcomes Short Term: Able to explain program exercise prescription;Long Term: Able to explain home exercise prescription to exercise independently          Exercise Goals Re-Evaluation :  Exercise Goals Re-Evaluation     Row Name 03/03/24 1421 04/04/24 1539           Exercise Goal Re-Evaluation   Exercise Goals Review Increase Physical Activity;Understanding of Exercise Prescription;Increase Strength and Stamina;Knowledge and understanding of Target Heart Rate Range (THRR);Able to understand and use rate of perceived exertion (RPE) scale Increase Physical Activity;Understanding of Exercise Prescription;Increase Strength and  Stamina;Knowledge and understanding of Target Heart Rate Range (THRR);Able to understand and use rate of perceived exertion (RPE) scale      Comments Pt's first day in the CRP2 program. Pt understands the exercise Rx, RPE scale and THRR. Reviewed goals with patient today. Pt voices progress on her goals of increased endurance and muscular strength. Pt has goal of weight loss hand has lost about 1.4 kg.      Expected Outcomes Will continue to monitor the patient and increase exercise workloads as tolerated. Will continue to monitor the patient and increase exercise workloads as tolerated.         Discharge Exercise Prescription (Final Exercise Prescription Changes):  Exercise Prescription Changes - 04/02/24 1500       Response to Exercise   Blood Pressure (Admit) 132/72    Blood Pressure (Exit) 108/68    Heart Rate (Admit) 54 bpm    Heart Rate (Exercise) 90 bpm    Heart Rate (Exit) 63 bpm    Rating of Perceived Exertion (Exercise) 11    Symptoms None    Comments Reviewed METs    Duration Continue with 30 min of aerobic exercise without signs/symptoms of physical distress.    Intensity THRR unchanged      Progression   Progression Continue to progress workloads to maintain intensity without signs/symptoms of physical distress.    Average METs 3.4      Resistance Training   Training Prescription No    Weight No wts on Wednesdays      Interval Training   Interval Training No      NuStep   Level 3    SPM 99    Minutes 15    METs 3.2      Track   Laps 20    Minutes 15    METs 3.55          Nutrition:  Target Goals:  Understanding of nutrition guidelines, daily intake of sodium 1500mg , cholesterol 200mg , calories 30% from fat and 7% or less from saturated fats, daily to have 5 or more servings of fruits and vegetables.  Education: Nutrition 1 -Group instruction provided by verbal, written material, interactive activities, discussions, models, and posters to present  general guidelines for heart healthy nutrition including macronutrients, label reading, and promoting whole foods over processed counterparts. Education serves as Pensions consultant of discussion of heart healthy eating for all. Written material provided at class time.    Education: Nutrition 2 -Group instruction provided by verbal, written material, interactive activities, discussions, models, and posters to present general guidelines for heart healthy nutrition including sodium, cholesterol, and saturated fat. Providing guidance of habit forming to improve blood pressure, cholesterol, and body weight. Written material provided at class time.     Biometrics:  Pre Biometrics - 02/26/24 0937       Pre Biometrics   Waist Circumference 39.5 inches    Hip Circumference 43 inches    Waist to Hip Ratio 0.92 %    Triceps Skinfold 30 mm    % Body Fat 43.8 %    Grip Strength 14 kg    Flexibility 14.25 in    Single Leg Stand 4.18 seconds           Nutrition Therapy Plan and Nutrition Goals:  Nutrition Therapy & Goals - 03/05/24 0834       Nutrition Therapy   Diet Heart Healthy Diet    Drug/Food Interactions Statins/Certain Fruits      Personal Nutrition Goals   Nutrition Goal Patient to identify strategies for reducing cardiovascular risk by attending the [employer] education and nutrition series weekly.    Personal Goal #2 Patient to improve diet quality by using the plate method as a guide for meal planning to include lean protein/plant protein, fruits, vegetables, whole grains, nonfat dairy as part of a well-balanced diet.    Comments Patient has medical history of TAVR, HTN, CAD, hyperlipidemia, hx of tobacco abuse, rheumatoid arthritis. Lipids are well controlled. Patient will benefit from participation in intensive cardiac rehab for nutrition education, exercise, and lifestyle modification.      Intervention Plan   Intervention Prescribe, educate and counsel regarding individualized  specific dietary modifications aiming towards targeted core components such as weight, hypertension, lipid management, diabetes, heart failure and other comorbidities.;Nutrition handout(s) given to patient.    Expected Outcomes Short Term Goal: Understand basic principles of dietary content, such as calories, fat, sodium, cholesterol and nutrients.;Long Term Goal: Adherence to prescribed nutrition plan.          Nutrition Assessments:  Nutrition Assessments - 03/07/24 1343       Rate Your Plate Scores   Pre Score 61         MEDIFICTS Score Key: >=70 Need to make dietary changes  40-70 Heart Healthy Diet <= 40 Therapeutic Level Cholesterol Diet  Flowsheet Row INTENSIVE CARDIAC REHAB from 03/07/2024 in [facility], [facility]  Picture Your Plate Total Score on Admission 61   Picture Your Plate Scores: <59 Unhealthy dietary pattern with much room for improvement. 41-50 Dietary pattern unlikely to meet recommendations for good health and room for improvement. 51-60 More healthful dietary pattern, with some room for improvement.  >60 Healthy dietary pattern, although there may be some specific behaviors that could be improved.    Nutrition Goals Re-Evaluation:  Nutrition Goals Re-Evaluation     Row Name 03/05/24  9165             Goals   Current Weight 163 lb 9.3 oz (74.2 kg)       Comment Lpa WNL, HDl 36, LDL 57       Expected Outcome Patient has medical history of TAVR, HTN, CAD, hyperlipidemia, hx of tobacco abuse, rheumatoid arthritis. Lipids are well controlled. Patient will benefit from participation in intensive cardiac rehab for nutrition education, exercise, and lifestyle modification.          Nutrition Goals Discharge (Final Nutrition Goals Re-Evaluation):  Nutrition Goals Re-Evaluation - 03/05/24 0834       Goals   Current Weight 163 lb 9.3 oz (74.2 kg)    Comment Lpa WNL, HDl 36, LDL 57    Expected Outcome  Patient has medical history of TAVR, HTN, CAD, hyperlipidemia, hx of tobacco abuse, rheumatoid arthritis. Lipids are well controlled. Patient will benefit from participation in intensive cardiac rehab for nutrition education, exercise, and lifestyle modification.          Psychosocial: Target Goals: Acknowledge presence or absence of significant depression and/or stress, maximize coping skills, provide positive support system. Participant is able to verbalize types and ability to use techniques and skills needed for reducing stress and depression.   Education: Stress, Anxiety, and Depression - Group verbal and visual presentation to define topics covered.  Reviews how body is impacted by stress, anxiety, and depression.  Also discusses healthy ways to reduce stress and to treat/manage anxiety and depression. Written material provided at class time.   Education: Sleep Hygiene -Provides group verbal and written instruction about how sleep can affect your health.  Define sleep hygiene, discuss sleep cycles and impact of sleep habits. Review good sleep hygiene tips.   Initial Review & Psychosocial Screening:  Initial Psych Review & Screening - 02/26/24 1053       Initial Review   Current issues with Current Anxiety/Panic      Family Dynamics   Good Support System? Yes   Maurine has her husband and two children and a grandson brother and sister for support.     Barriers   Psychosocial barriers to participate in program There are no identifiable barriers or psychosocial needs.      Screening Interventions   Interventions Encouraged to exercise          Quality of Life Scores:   Quality of Life - 02/26/24 1150       Quality of Life   Select Quality of Life      Quality of Life Scores   Health/Function Pre 25.2 %    Socioeconomic Pre 27.14 %    Psych/Spiritual Pre 28.93 %    Family Pre 28 %    GLOBAL Pre 26.78 %         Scores of 19 and below usually indicate a poorer  quality of life in these areas.  A difference of  2-3 points is a clinically meaningful difference.  A difference of 2-3 points in the total score of the Quality of Life Index has been associated with significant improvement in overall quality of life, self-image, physical symptoms, and general health in studies assessing change in quality of life.  PHQ-9: Review Flowsheet       02/26/2024 07/21/2013  Depression screen PHQ 2/9  Decreased Interest 0 0  Down, Depressed, Hopeless 0 0  PHQ - 2 Score 0 0  Altered sleeping 0 -  Tired, decreased energy 1 -  Change  in appetite 0 -  Feeling bad or failure about yourself  0 -  Trouble concentrating 0 -  Moving slowly or fidgety/restless 0 -  Suicidal thoughts 0 -  PHQ-9 Score 1 -  Difficult doing work/chores Not difficult at all -   Interpretation of Total Score  Total Score Depression Severity:  1-4 = Minimal depression, 5-9 = Mild depression, 10-14 = Moderate depression, 15-19 = Moderately severe depression, 20-27 = Severe depression   Psychosocial Evaluation and Intervention:   Psychosocial Re-Evaluation:  Psychosocial Re-Evaluation     Row Name 03/03/24 1435 03/07/24 1445 04/04/24 1628         Psychosocial Re-Evaluation   Current issues with Current Anxiety/Panic Current Anxiety/Panic Current Anxiety/Panic     Comments Pt started cardiac rehab today (02/18/24). He did not voice any concerns or stressors. Pt has not voiced any increased concerns or stressors during exercise at cardiac rehab. Pt has not voiced any increased concerns or stressors during exercise at cardiac rehab.     Expected Outcomes Pt will have controlled/reduced stress and anxiety by completion of cardiac rehab. Pt will have controlled/reduced stress and anxiety by completion of cardiac rehab. Pt will have controlled/reduced stress and anxiety by completion of cardiac rehab.     Interventions Encouraged to attend Cardiac Rehabilitation for the exercise;Stress  management education;Relaxation education Encouraged to attend Cardiac Rehabilitation for the exercise;Stress management education;Relaxation education Encouraged to attend Cardiac Rehabilitation for the exercise;Stress management education;Relaxation education     Continue Psychosocial Services  Follow up required by staff  Will continue to monitor and offer support as needed. Follow up required by staff  will continue to monitor and offer support as needed Follow up required by staff  will continue to monitor and offer support as needed        Psychosocial Discharge (Final Psychosocial Re-Evaluation):  Psychosocial Re-Evaluation - 04/04/24 1628       Psychosocial Re-Evaluation   Current issues with Current Anxiety/Panic    Comments Pt has not voiced any increased concerns or stressors during exercise at cardiac rehab.    Expected Outcomes Pt will have controlled/reduced stress and anxiety by completion of cardiac rehab.    Interventions Encouraged to attend Cardiac Rehabilitation for the exercise;Stress management education;Relaxation education    Continue Psychosocial Services  Follow up required by staff   will continue to monitor and offer support as needed         Vocational Rehabilitation: Provide vocational rehab assistance to qualifying candidates.   Vocational Rehab Evaluation & Intervention:  Vocational Rehab - 02/26/24 1054       Initial Vocational Rehab Evaluation & Intervention   Assessment shows need for Vocational Rehabilitation No   [name] is retired and does not need vocational rehab at this time         Education: Education Goals: Education classes will be provided on a variety of topics geared toward better understanding of heart health and risk factor modification. Participant will state understanding/return demonstration of topics presented as noted by education test scores.  Learning Barriers/Preferences:  Learning Barriers/Preferences - 02/26/24 1150        Learning Barriers/Preferences   Learning Barriers Sight   reading glasses   Learning Preferences Group Instruction;Individual Instruction;Skilled Demonstration          General Cardiac Education Topics:  AED/CPR: - Group verbal and written instruction with the use of models to demonstrate the basic use of the AED with the basic ABC's of resuscitation.   Test  and Procedures: - Group verbal and visual presentation and models provide information about basic cardiac anatomy and function. Reviews the testing methods done to diagnose heart disease and the outcomes of the test results. Describes the treatment choices: Medical Management, Angioplasty, or Coronary Bypass Surgery for treating various heart conditions including Myocardial Infarction, Angina, Valve Disease, and Cardiac Arrhythmias. Written material provided at class time.   Medication Safety: - Group verbal and visual instruction to review commonly prescribed medications for heart and lung disease. Reviews the medication, class of the drug, and side effects. Includes the steps to properly store meds and maintain the prescription regimen. Written material provided at class time.   Intimacy: - Group verbal instruction through game format to discuss how heart and lung disease can affect sexual intimacy. Written material provided at class time.   Know Your Numbers and Heart Failure: - Group verbal and visual instruction to discuss disease risk factors for cardiac and pulmonary disease and treatment options.  Reviews associated critical values for Overweight/Obesity, Hypertension, Cholesterol, and Diabetes.  Discusses basics of heart failure: signs/symptoms and treatments.  Introduces Heart Failure Zone chart for action plan for heart failure. Written material provided at class time.   Infection Prevention: - Provides verbal and written material to individual with discussion of infection control including proper hand washing and  proper equipment cleaning during exercise session.   Falls Prevention: - Provides verbal and written material to individual with discussion of falls prevention and safety.   Other: -Provides group and verbal instruction on various topics (see comments)   Knowledge Questionnaire Score:  Knowledge Questionnaire Score - 02/26/24 1151       Knowledge Questionnaire Score   Pre Score 18/24          Core Components/Risk Factors/Patient Goals at Admission:  Personal Goals and Risk Factors at Admission - 02/26/24 1152       Core Components/Risk Factors/Patient Goals on Admission    Weight Management Obesity;Weight Loss    Hypertension Yes    Intervention Provide education on lifestyle modifcations including regular physical activity/exercise, weight management, moderate sodium restriction and increased consumption of fresh fruit, vegetables, and low fat dairy, alcohol moderation, and smoking cessation.;Monitor prescription use compliance.    Expected Outcomes Short Term: Continued assessment and intervention until BP is < 140/79mm HG in hypertensive participants. < 130/8mm HG in hypertensive participants with diabetes, heart failure or chronic kidney disease.;Long Term: Maintenance of blood pressure at goal levels.    Lipids Yes    Intervention Provide education and support for participant on nutrition & aerobic/resistive exercise along with prescribed medications to achieve LDL 70mg , HDL >40mg .    Expected Outcomes Short Term: Participant states understanding of desired cholesterol values and is compliant with medications prescribed. Participant is following exercise prescription and nutrition guidelines.;Long Term: Cholesterol controlled with medications as prescribed, with individualized exercise RX and with personalized nutrition plan. Value goals: LDL < 70mg , HDL > 40 mg.    Stress Yes    Intervention Offer individual and/or small group education and counseling on adjustment to heart  disease, stress management and health-related lifestyle change. Teach and support self-help strategies.;Refer participants experiencing significant psychosocial distress to appropriate mental health specialists for further evaluation and treatment. When possible, include family members and significant others in education/counseling sessions.    Expected Outcomes Short Term: Participant demonstrates changes in health-related behavior, relaxation and other stress management skills, ability to obtain effective social support, and compliance with psychotropic medications if prescribed.;Long Term: Emotional wellbeing is  indicated by absence of clinically significant psychosocial distress or social isolation.          Education:Diabetes - Individual verbal and written instruction to review signs/symptoms of diabetes, desired ranges of glucose level fasting, after meals and with exercise. Acknowledge that pre and post exercise glucose checks will be done for 3 sessions at entry of program.   Core Components/Risk Factors/Patient Goals Review:   Goals and Risk Factor Review     Row Name 03/03/24 1438 03/07/24 1447 04/04/24 1629         Core Components/Risk Factors/Patient Goals Review   Personal Goals Review Hypertension;Lipids;Stress Hypertension;Lipids;Stress Hypertension;Lipids;Stress     Review Pt started exercise today (03/03/24). VSS. Tolerated exercise well. Tolerated exercise well. Pt doing well with exercise at cardiac rehab. Vital signs have been stable. [name] has increased his met levels. Tolerated exercise well. Pt doing well with exercise at cardiac rehab. Vital signs have been stable. [name] has increased his met levels.     Expected Outcomes Pt will continue to participate in cardiac rehab for exercise, nutrition, and lifestyle modifications Pt will continue to participate in cardiac rehab for exercise, nutrition, and lifestyle modifications Pt will continue to participate in cardiac rehab for  exercise, nutrition, and lifestyle modifications        Core Components/Risk Factors/Patient Goals at Discharge (Final Review):   Goals and Risk Factor Review - 04/04/24 1629       Core Components/Risk Factors/Patient Goals Review   Personal Goals Review Hypertension;Lipids;Stress    Review Tolerated exercise well. Pt doing well with exercise at cardiac rehab. Vital signs have been stable. [name] has increased his met levels.    Expected Outcomes Pt will continue to participate in cardiac rehab for exercise, nutrition, and lifestyle modifications          ITP Comments:  ITP Comments     Row Name 02/26/24 1050 03/03/24 1431 03/07/24 1444 04/04/24 1627     ITP Comments Dr Wilbert Bihari MD, Medical Director. Introduction to [facility] Education program/ Intensive Cardiac Rehab. Initial Orientation Packet reviewed with the patient. 30 Day ITP Review. Pt started cardiac rehab today (03/03/24). Tolerated exercise well. 30 Day ITP Review. [name] has good attendance and participation with exercise in cardiac rehab. 30 Day ITP Review. [name] has good attendance and participation with exercise in cardiac rehab.       Comments: see ITP comments

## 2024-04-09 ENCOUNTER — Encounter (HOSPITAL_COMMUNITY)

## 2024-04-11 ENCOUNTER — Encounter (HOSPITAL_COMMUNITY)

## 2024-04-14 ENCOUNTER — Encounter (HOSPITAL_COMMUNITY)

## 2024-04-16 ENCOUNTER — Encounter (HOSPITAL_COMMUNITY)

## 2024-04-18 ENCOUNTER — Encounter (HOSPITAL_COMMUNITY)

## 2024-04-21 ENCOUNTER — Encounter (HOSPITAL_COMMUNITY)
Admission: RE | Admit: 2024-04-21 | Discharge: 2024-04-21 | Disposition: A | Discharge status: Home / Self Care | Source: Ambulatory Visit | Attending: Cardiology

## 2024-04-21 DIAGNOSIS — Z48812 Encounter for surgical aftercare following surgery on the circulatory system: Secondary | ICD-10-CM | POA: Diagnosis not present

## 2024-04-21 DIAGNOSIS — Z952 Presence of prosthetic heart valve: Secondary | ICD-10-CM

## 2024-04-23 ENCOUNTER — Encounter (HOSPITAL_COMMUNITY)
Admission: RE | Admit: 2024-04-23 | Discharge: 2024-04-23 | Disposition: A | Discharge status: Home / Self Care | Source: Ambulatory Visit | Attending: Cardiology | Admitting: Cardiology

## 2024-04-23 DIAGNOSIS — Z952 Presence of prosthetic heart valve: Secondary | ICD-10-CM

## 2024-04-23 DIAGNOSIS — Z48812 Encounter for surgical aftercare following surgery on the circulatory system: Secondary | ICD-10-CM | POA: Diagnosis not present

## 2024-04-25 ENCOUNTER — Encounter (HOSPITAL_COMMUNITY)
Admission: RE | Admit: 2024-04-25 | Discharge: 2024-04-25 | Disposition: A | Discharge status: Home / Self Care | Source: Ambulatory Visit | Attending: Cardiology | Admitting: Cardiology

## 2024-04-25 DIAGNOSIS — Z952 Presence of prosthetic heart valve: Secondary | ICD-10-CM

## 2024-04-25 DIAGNOSIS — Z48812 Encounter for surgical aftercare following surgery on the circulatory system: Secondary | ICD-10-CM | POA: Diagnosis not present

## 2024-04-28 ENCOUNTER — Encounter (HOSPITAL_COMMUNITY)
Admission: RE | Admit: 2024-04-28 | Discharge: 2024-04-28 | Disposition: A | Discharge status: Home / Self Care | Source: Ambulatory Visit | Attending: Cardiology | Admitting: Cardiology

## 2024-04-28 DIAGNOSIS — Z48812 Encounter for surgical aftercare following surgery on the circulatory system: Secondary | ICD-10-CM | POA: Diagnosis not present

## 2024-04-28 DIAGNOSIS — Z952 Presence of prosthetic heart valve: Secondary | ICD-10-CM

## 2024-04-30 ENCOUNTER — Encounter (HOSPITAL_COMMUNITY)
Admission: RE | Admit: 2024-04-30 | Discharge: 2024-04-30 | Disposition: A | Discharge status: Home / Self Care | Source: Ambulatory Visit | Attending: Cardiology | Admitting: Cardiology

## 2024-04-30 DIAGNOSIS — Z952 Presence of prosthetic heart valve: Secondary | ICD-10-CM | POA: Diagnosis present

## 2024-05-02 ENCOUNTER — Encounter (HOSPITAL_COMMUNITY)
Admission: RE | Admit: 2024-05-02 | Discharge: 2024-05-02 | Disposition: A | Discharge status: Home / Self Care | Source: Ambulatory Visit | Attending: Cardiology | Admitting: Cardiology

## 2024-05-02 DIAGNOSIS — Z952 Presence of prosthetic heart valve: Secondary | ICD-10-CM

## 2024-05-05 ENCOUNTER — Encounter (HOSPITAL_COMMUNITY)
Admission: RE | Admit: 2024-05-05 | Discharge: 2024-05-05 | Disposition: A | Discharge status: Home / Self Care | Source: Ambulatory Visit | Attending: Cardiology | Admitting: Cardiology

## 2024-05-05 DIAGNOSIS — Z952 Presence of prosthetic heart valve: Secondary | ICD-10-CM

## 2024-05-06 ENCOUNTER — Encounter (HOSPITAL_COMMUNITY): Payer: Self-pay

## 2024-05-06 NOTE — Progress Notes (Signed)
 Cardiac Individual Treatment Plan  Patient Details  Name: Taylor Stanley MRN: [id number] Date of Birth: 1956-06-06 Referring Provider:   Flowsheet Row INTENSIVE CARDIAC REHAB ORIENT from 02/26/2024 in [facility]  Referring Provider Newman Lawrence, MD    Initial Encounter Date:  Flowsheet Row INTENSIVE CARDIAC REHAB ORIENT from 02/26/2024 in [facility], [facility], [facility]  Date 02/26/24    Visit Diagnosis: No diagnosis found.  Patient's Home Medications on Admission:  Current Outpatient Medications:    albuterol  (VENTOLIN  HFA) 108 (90 Base) MCG/ACT inhaler, TAKE 2 PUFFS BY MOUTH EVERY 4 HOURS AS NEEDED, Disp: 18 g, Rfl: 2   ascorbic acid (VITAMIN C) 500 MG tablet, Take 1,000 mg by mouth daily., Disp: , Rfl:    aspirin  EC 81 MG tablet, Take 1 tablet (81 mg total) by mouth daily. Swallow whole., Disp: 90 tablet, Rfl: 2   azithromycin  (ZITHROMAX ) 500 MG tablet, Take 1 tablet (500 mg total) by mouth as directed. Take one tablet 1 hour before any dental work including cleanings., Disp: 6 tablet, Rfl: 12   carvedilol  (COREG ) 3.125 MG tablet, Take 1 tablet (3.125 mg total) by mouth 2 (two) times daily with a meal., Disp: 180 tablet, Rfl: 2   cholecalciferol (VITAMIN D) 1000 UNITS tablet, Take 1,000 Units by mouth in the morning., Disp: , Rfl:    clobetasol cream (TEMOVATE) 0.05 %, Apply 1 Application topically 2 (two) times daily as needed (rash)., Disp: , Rfl:    desloratadine  (CLARINEX ) 5 MG tablet, TAKE 1 TABLET BY MOUTH DAILY AS NEEDED, Disp: 90 tablet, Rfl: 1   esomeprazole  (NEXIUM ) 40 MG capsule, TAKE 1 CAPSULE BY MOUTH EVERY MORNING, Disp: 90 capsule, Rfl: 2   fluticasone  (FLONASE ) 50 MCG/ACT nasal spray, SPRAY 1-2 SPRAYS INTO EACH NOSTRIL 1-7 times per week., Disp: 16 mL, Rfl: 5   Fluticasone  Furoate (ARNUITY ELLIPTA ) 200 MCG/ACT AEPB, INHALE 1 PUFF BY MOUTH DAILY, Disp: 30 each, Rfl:  11   Golimumab (SIMPONI ARIA IV), Inject 1 Dose into the vein every 2 (two) months., Disp: , Rfl:    ibuprofen (ADVIL) 200 MG tablet, Take 200 mg by mouth every 6 (six) hours as needed for moderate pain (pain score 4-6)., Disp: , Rfl:    nitroGLYCERIN  (NITROSTAT ) 0.4 MG SL tablet, Place 1 tablet (0.4 mg total) under the tongue every 5 (five) minutes as needed for chest pain., Disp: 25 tablet, Rfl: 4   olmesartan-hydrochlorothiazide (BENICAR HCT) 40-25 MG tablet, Take 1 tablet by mouth in the morning., Disp: , Rfl:    rosuvastatin  (CRESTOR ) 20 MG tablet, Take 1 tablet (20 mg total) by mouth daily., Disp: 90 tablet, Rfl: 2   sertraline  (ZOLOFT ) 100 MG tablet, Take 50 mg by mouth at bedtime., Disp: , Rfl:    triamcinolone cream (KENALOG) 0.1 %, Apply 1 Application topically 2 (two) times daily as needed (skin irritation)., Disp: , Rfl:   Past Medical History: Past Medical History:  Diagnosis Date   Anxiety    Asthma    triggered by allergies; does not use inhaler daily   Carpal tunnel syndrome of right wrist 11/2011   Dental crowns present    Depression    GERD (gastroesophageal reflux disease)    Hx of colonic polyps    Hypertension    under control; has been on med. x 5 yrs.   Osteopenia    PONV (postoperative nausea and vomiting)  S/P TAVR (transcatheter aortic valve replacement) 01/29/2024   s/p TAVR with a 23 mm Edwards S3UR via the TF approach by Dr. Wonda and Dr. Lucas   Seasonal allergies    Seizure [facility]) as an infant   x 1 - unknown cause   Snores    denies apnea   TMJ disease    Wears glasses     Tobacco Use: Social History   Tobacco Use  Smoking Status Former   Current packs/day: 0.00   Average packs/day: 1 pack/day for 13.0 years (13.0 ttl pk-yrs)   Types: Cigarettes   Start date: 02/11/1992   Quit date: 02/10/2005   Years since quitting: 19.2  Smokeless Tobacco Never    Labs: Review Flowsheet  More data exists      Latest Ref Rng & Units 06/23/2013  02/22/2022 10/20/2022 11/26/2023 01/29/2024  Labs for ITP Cardiac and Pulmonary Rehab  Cholestrol 100 - 199 mg/dL 839  94  889  - -  LDL (calc) 0 - 99 mg/dL 892  41  57  - -  HDL-C >39 mg/dL 69.69  33  36  - -  Trlycerides 0 - 149 mg/dL 886.9  895  86  - -  PH, Arterial 7.35 - 7.45 - - - 7.418  -  PCO2 arterial 32 - 48 mmHg - - - 40.2  -  Bicarbonate 20.0 - 28.0 mmol/L - - - 27.7  25.9  28.2  -  TCO2 22 - 32 mmol/L - - - 29  27  30  26    O2 Saturation % - - - 76  96  75  -    Details       Multiple values from one day are sorted in reverse-chronological order          Exercise Target Goals: Exercise Program Goal: Individual exercise prescription set using results from initial 6 min walk test and THRR while considering  patient's activity barriers and safety.   Exercise Prescription Goal: Initial exercise prescription builds to 30-45 minutes a day of aerobic activity, 2-3 days per week.  Home exercise guidelines will be given to patient during program as part of exercise prescription that the participant will acknowledge.   Education: Aerobic Exercise: - Group verbal and visual presentation on the components of exercise prescription. Introduces F.I.T.T principle from ACSM for exercise prescriptions.  Reviews F.I.T.T. principles of aerobic exercise including progression. Written material provided at class time.   Education: Resistance Exercise: - Group verbal and visual presentation on the components of exercise prescription. Introduces F.I.T.T principle from ACSM for exercise prescriptions  Reviews F.I.T.T. principles of resistance exercise including progression. Written material provided at class time.    Education: Exercise & Equipment Safety: - Individual verbal instruction and demonstration of equipment use and safety with use of the equipment.   Education: Exercise Physiology & General Exercise Guidelines: - Group verbal and written instruction with models to review the  exercise physiology of the cardiovascular system and associated critical values. Provides general exercise guidelines with specific guidelines to those with heart or lung disease. Written material provided at class time.   Education: Flexibility, Balance, Mind/Body Relaxation: - Group verbal and visual presentation with interactive activity on the components of exercise prescription. Introduces F.I.T.T principle from ACSM for exercise prescriptions. Reviews F.I.T.T. principles of flexibility and balance exercise training including progression. Also discusses the mind body connection.  Reviews various relaxation techniques to help reduce and manage stress (i.e. Deep breathing, progressive muscle relaxation, and  visualization). Balance handout provided to take home. Written material provided at class time.   Activity Barriers & Risk Stratification:   6 Minute Walk:   Oxygen Initial Assessment:   Oxygen Re-Evaluation:   Oxygen Discharge (Final Oxygen Re-Evaluation):   Initial Exercise Prescription:   Perform Capillary Blood Glucose checks as needed.  Exercise Prescription Changes:   Exercise Prescription Changes     Row Name 04/02/24 1500 05/02/24 1600           Response to Exercise   Blood Pressure (Admit) 132/72 148/62      Blood Pressure (Exit) 108/68 130/74      Heart Rate (Admit) 54 bpm 56 bpm      Heart Rate (Exercise) 90 bpm 84 bpm      Heart Rate (Exit) 63 bpm 62 bpm      Rating of Perceived Exertion (Exercise) 11 10      Symptoms None None      Comments Reviewed METs Reviewed goals and METs      Duration Continue with 30 min of aerobic exercise without signs/symptoms of physical distress. Continue with 30 min of aerobic exercise without signs/symptoms of physical distress.      Intensity THRR unchanged THRR unchanged        Progression   Progression Continue to progress workloads to maintain intensity without signs/symptoms of physical distress. Continue to  progress workloads to maintain intensity without signs/symptoms of physical distress.      Average METs 3.4 3.2        Resistance Training   Training Prescription No Yes      Weight No wts on Wednesdays 2 lbs      Reps -- 10-15      Time -- 5 Minutes        Interval Training   Interval Training No No        Treadmill   MPH -- 2.4      Grade -- 0      Minutes -- 15      METs -- 2.84        NuStep   Level 3 4      SPM 99 94      Minutes 15 15      METs 3.2 3.5        Track   Laps 20 --      Minutes 15 --      METs 3.55 --         Exercise Comments:   Exercise Comments     Row Name 04/02/24 0150 04/02/24 1500 04/21/24 1607 05/02/24 1648     Exercise Comments -- Reviewed METs. Pt is progressing well. Pt dropped Octance because it was uncomfortable for her back, using track and doing very well. Reviewed METs. Pt is progressing well. Pt has been out of town and returned tot he program today. No workload changres. Pt report walking daily while out of town. Reviwed METs and goals. Pt is progressing and will increase workload on nustep next session. Average METs have dropped as pt in not walking on the track anymore and is now on the treadmill. Pt has been getting used to it and will be able to begin increasing workloads.       Exercise Goals and Review:   Exercise Goals Re-Evaluation :  Exercise Goals Re-Evaluation     Row Name 04/04/24 1539 05/02/24 1646           Exercise Goal Re-Evaluation   Exercise  Goals Review Increase Physical Activity;Understanding of Exercise Prescription;Increase Strength and Stamina;Knowledge and understanding of Target Heart Rate Range (THRR);Able to understand and use rate of perceived exertion (RPE) scale Increase Physical Activity;Understanding of Exercise Prescription;Increase Strength and Stamina;Knowledge and understanding of Target Heart Rate Range (THRR);Able to understand and use rate of perceived exertion (RPE) scale      Comments  Reviewed goals with patient today. Pt voices progress on her goals of increased endurance and muscular strength. Pt has goal of weight loss hand has lost about 1.4 kg. Reviewed goals and METs with patient today. Pt voices continued progress on her goals of increased endurance and muscular strength. Pt's weight change from basline is 1 kg.      Expected Outcomes Will continue to monitor the patient and increase exercise workloads as tolerated. Will continue to monitor the patient and increase exercise workloads as tolerated.         Discharge Exercise Prescription (Final Exercise Prescription Changes):  Exercise Prescription Changes - 05/02/24 1600       Response to Exercise   Blood Pressure (Admit) 148/62    Blood Pressure (Exit) 130/74    Heart Rate (Admit) 56 bpm    Heart Rate (Exercise) 84 bpm    Heart Rate (Exit) 62 bpm    Rating of Perceived Exertion (Exercise) 10    Symptoms None    Comments Reviewed goals and METs    Duration Continue with 30 min of aerobic exercise without signs/symptoms of physical distress.    Intensity THRR unchanged      Progression   Progression Continue to progress workloads to maintain intensity without signs/symptoms of physical distress.    Average METs 3.2      Resistance Training   Training Prescription Yes    Weight 2 lbs    Reps 10-15    Time 5 Minutes      Interval Training   Interval Training No      Treadmill   MPH 2.4    Grade 0    Minutes 15    METs 2.84      NuStep   Level 4    SPM 94    Minutes 15    METs 3.5          Nutrition:  Target Goals: Understanding of nutrition guidelines, daily intake of sodium 1500mg , cholesterol 200mg , calories 30% from fat and 7% or less from saturated fats, daily to have 5 or more servings of fruits and vegetables.  Education: Nutrition 1 -Group instruction provided by verbal, written material, interactive activities, discussions, models, and posters to present general guidelines for  heart healthy nutrition including macronutrients, label reading, and promoting whole foods over processed counterparts. Education serves as Pensions consultant of discussion of heart healthy eating for all. Written material provided at class time.    Education: Nutrition 2 -Group instruction provided by verbal, written material, interactive activities, discussions, models, and posters to present general guidelines for heart healthy nutrition including sodium, cholesterol, and saturated fat. Providing guidance of habit forming to improve blood pressure, cholesterol, and body weight. Written material provided at class time.     Biometrics:    Nutrition Therapy Plan and Nutrition Goals:   Nutrition Assessments:  Nutrition Assessments - 03/07/24 1343       Rate Your Plate Scores   Pre Score 61         MEDIFICTS Score Key: >=70 Need to make dietary changes  40-70 Heart Healthy Diet <= 40 Therapeutic Level Cholesterol  Diet  Flowsheet Row INTENSIVE CARDIAC REHAB from 03/07/2024 in [facility]  Picture Your Plate Total Score on Admission 61   Picture Your Plate Scores: <59 Unhealthy dietary pattern with much room for improvement. 41-50 Dietary pattern unlikely to meet recommendations for good health and room for improvement. 51-60 More healthful dietary pattern, with some room for improvement.  >60 Healthy dietary pattern, although there may be some specific behaviors that could be improved.    Nutrition Goals Re-Evaluation:   Nutrition Goals Discharge (Final Nutrition Goals Re-Evaluation):   Psychosocial: Target Goals: Acknowledge presence or absence of significant depression and/or stress, maximize coping skills, provide positive support system. Participant is able to verbalize types and ability to use techniques and skills needed for reducing stress and depression.   Education: Stress, Anxiety, and Depression - Group  verbal and visual presentation to define topics covered.  Reviews how body is impacted by stress, anxiety, and depression.  Also discusses healthy ways to reduce stress and to treat/manage anxiety and depression. Written material provided at class time.   Education: Sleep Hygiene -Provides group verbal and written instruction about how sleep can affect your health.  Define sleep hygiene, discuss sleep cycles and impact of sleep habits. Review good sleep hygiene tips.   Initial Review & Psychosocial Screening:   Quality of Life Scores:   Scores of 19 and below usually indicate a poorer quality of life in these areas.  A difference of  2-3 points is a clinically meaningful difference.  A difference of 2-3 points in the total score of the Quality of Life Index has been associated with significant improvement in overall quality of life, self-image, physical symptoms, and general health in studies assessing change in quality of life.  PHQ-9: Review Flowsheet       02/26/2024 07/21/2013  Depression screen PHQ 2/9  Decreased Interest 0 0  Down, Depressed, Hopeless 0 0  PHQ - 2 Score 0 0  Altered sleeping 0 -  Tired, decreased energy 1 -  Change in appetite 0 -  Feeling bad or failure about yourself  0 -  Trouble concentrating 0 -  Moving slowly or fidgety/restless 0 -  Suicidal thoughts 0 -  PHQ-9 Score 1 -  Difficult doing work/chores Not difficult at all -   Interpretation of Total Score  Total Score Depression Severity:  1-4 = Minimal depression, 5-9 = Mild depression, 10-14 = Moderate depression, 15-19 = Moderately severe depression, 20-27 = Severe depression   Psychosocial Evaluation and Intervention:   Psychosocial Re-Evaluation:  Psychosocial Re-Evaluation     Row Name 03/07/24 1445 04/04/24 1628           Psychosocial Re-Evaluation   Current issues with Current Anxiety/Panic Current Anxiety/Panic      Comments Pt has not voiced any increased concerns or stressors  during exercise at cardiac rehab. Pt has not voiced any increased concerns or stressors during exercise at cardiac rehab.      Expected Outcomes Pt will have controlled/reduced stress and anxiety by completion of cardiac rehab. Pt will have controlled/reduced stress and anxiety by completion of cardiac rehab.      Interventions Encouraged to attend Cardiac Rehabilitation for the exercise;Stress management education;Relaxation education Encouraged to attend Cardiac Rehabilitation for the exercise;Stress management education;Relaxation education      Continue Psychosocial Services  Follow up required by staff  will continue to monitor and offer support as needed Follow up required by staff  will continue to monitor and offer support as needed         Psychosocial Discharge (Final Psychosocial Re-Evaluation):  Psychosocial Re-Evaluation - 04/04/24 1628       Psychosocial Re-Evaluation   Current issues with Current Anxiety/Panic    Comments Pt has not voiced any increased concerns or stressors during exercise at cardiac rehab.    Expected Outcomes Pt will have controlled/reduced stress and anxiety by completion of cardiac rehab.    Interventions Encouraged to attend Cardiac Rehabilitation for the exercise;Stress management education;Relaxation education    Continue Psychosocial Services  Follow up required by staff   will continue to monitor and offer support as needed         Vocational Rehabilitation: Provide vocational rehab assistance to qualifying candidates.   Vocational Rehab Evaluation & Intervention:   Education: Education Goals: Education classes will be provided on a variety of topics geared toward better understanding of heart health and risk factor modification. Participant will state understanding/return demonstration of topics presented as noted by education test scores.  Learning Barriers/Preferences:   General Cardiac Education Topics:  AED/CPR: - Group verbal and  written instruction with the use of models to demonstrate the basic use of the AED with the basic ABC's of resuscitation.   Test and Procedures: - Group verbal and visual presentation and models provide information about basic cardiac anatomy and function. Reviews the testing methods done to diagnose heart disease and the outcomes of the test results. Describes the treatment choices: Medical Management, Angioplasty, or Coronary Bypass Surgery for treating various heart conditions including Myocardial Infarction, Angina, Valve Disease, and Cardiac Arrhythmias. Written material provided at class time.   Medication Safety: - Group verbal and visual instruction to review commonly prescribed medications for heart and lung disease. Reviews the medication, class of the drug, and side effects. Includes the steps to properly store meds and maintain the prescription regimen. Written material provided at class time.   Intimacy: - Group verbal instruction through game format to discuss how heart and lung disease can affect sexual intimacy. Written material provided at class time.   Know Your Numbers and Heart Failure: - Group verbal and visual instruction to discuss disease risk factors for cardiac and pulmonary disease and treatment options.  Reviews associated critical values for Overweight/Obesity, Hypertension, Cholesterol, and Diabetes.  Discusses basics of heart failure: signs/symptoms and treatments.  Introduces Heart Failure Zone chart for action plan for heart failure. Written material provided at class time.   Infection Prevention: - Provides verbal and written material to individual with discussion of infection control including proper hand washing and proper equipment cleaning during exercise session.   Falls Prevention: - Provides verbal and written material to individual with discussion of falls prevention and safety.   Other: -Provides group and verbal instruction on various topics  (see comments)   Knowledge Questionnaire Score:   Core Components/Risk Factors/Patient Goals at Admission:   Education:Diabetes - Individual verbal and written instruction to review signs/symptoms of diabetes, desired ranges of glucose level fasting, after meals and with exercise. Acknowledge that pre and post exercise glucose checks will be done for 3 sessions at entry of program.   Core Components/Risk Factors/Patient Goals Review:   Goals and Risk Factor Review     Row Name 03/07/24 1447 04/04/24 1629           Core Components/Risk Factors/Patient Goals Review   Personal Goals Review Hypertension;Lipids;Stress Hypertension;Lipids;Stress      Review Tolerated exercise well. Pt doing well  with exercise at cardiac rehab. Vital signs have been stable. [name] has increased his met levels. Tolerated exercise well. Pt doing well with exercise at cardiac rehab. Vital signs have been stable. [name] has increased his met levels.      Expected Outcomes Pt will continue to participate in cardiac rehab for exercise, nutrition, and lifestyle modifications Pt will continue to participate in cardiac rehab for exercise, nutrition, and lifestyle modifications         Core Components/Risk Factors/Patient Goals at Discharge (Final Review):   Goals and Risk Factor Review - 04/04/24 1629       Core Components/Risk Factors/Patient Goals Review   Personal Goals Review Hypertension;Lipids;Stress    Review Tolerated exercise well. Pt doing well with exercise at cardiac rehab. Vital signs have been stable. [name] has increased his met levels.    Expected Outcomes Pt will continue to participate in cardiac rehab for exercise, nutrition, and lifestyle modifications          ITP Comments:  ITP Comments     Row Name 03/07/24 1444 04/04/24 1627         ITP Comments 30 Day ITP Review. [name] has good attendance and participation with exercise in cardiac rehab. 30 Day ITP Review. [name] has good attendance  and participation with exercise in cardiac rehab.         Comments: see ITP comments

## 2024-05-06 NOTE — Progress Notes (Signed)
 Reviewed home exercise Rx with patient today.  Encouraged warm-up, cool-down, and stretching. Reviewed THRR of  60-120 and keeping RPE between 11-13. Encouraged to hydrate with activity.  Reviewed weather parameters for temperature and humidity for safe exercise outdoors. Reviewed S/S to terminate exercise and when to call 911 vs MD. Reviewed the use of NTG and pt was encouraged to carry at all times. Pt encouraged to always carry a cell phone for safety when exercising outdoors. Pt verbalized understanding of the home exercise Rx and was provided a copy.   Lorin Picket MS, ACSM-CEP, CCRP

## 2024-05-07 ENCOUNTER — Encounter (HOSPITAL_COMMUNITY)
Admission: RE | Admit: 2024-05-07 | Discharge: 2024-05-07 | Disposition: A | Source: Ambulatory Visit | Attending: Cardiology

## 2024-05-07 DIAGNOSIS — Z952 Presence of prosthetic heart valve: Secondary | ICD-10-CM | POA: Diagnosis not present

## 2024-05-09 ENCOUNTER — Encounter (HOSPITAL_COMMUNITY)
Admission: RE | Admit: 2024-05-09 | Discharge: 2024-05-09 | Disposition: A | Discharge status: Home / Self Care | Source: Ambulatory Visit | Attending: Cardiology | Admitting: Cardiology

## 2024-05-09 DIAGNOSIS — Z952 Presence of prosthetic heart valve: Secondary | ICD-10-CM

## 2024-05-12 ENCOUNTER — Telehealth: Payer: Self-pay | Admitting: Cardiology

## 2024-05-12 ENCOUNTER — Encounter (HOSPITAL_COMMUNITY)
Admission: RE | Admit: 2024-05-12 | Discharge: 2024-05-12 | Disposition: A | Discharge status: Home / Self Care | Source: Ambulatory Visit | Attending: Cardiology | Admitting: Cardiology

## 2024-05-12 ENCOUNTER — Encounter (HOSPITAL_COMMUNITY)

## 2024-05-12 MED ORDER — CARVEDILOL 3.125 MG PO TABS: 3.1250 mg | ORAL_TABLET | Freq: Two times a day (BID) | ORAL | 0 refills | Status: DC

## 2024-05-12 NOTE — Telephone Encounter (Signed)
 Pt's medication was sent to pt's pharmacy as requested. Confirmation received.

## 2024-05-12 NOTE — Telephone Encounter (Signed)
*  STAT* If patient is at the pharmacy, call can be transferred to refill team.   1. Which medications need to be refilled? (please list name of each medication and dose if known) carvedilol  (COREG ) 3.125 MG tablet    2. Would you like to learn more about the convenience, safety, & potential cost savings by using the [facility]?    3. Are you open to using the [facility] (Type [facility].  ).   4. Which pharmacy/location (including street and city if local pharmacy) is medication to be sent to? [facility] [id number] - [place], [place] - [address]    5. Do they need a 30 day or 90 day supply? 30 day   Pt went out of town and forgot medication.

## 2024-05-14 ENCOUNTER — Encounter (HOSPITAL_COMMUNITY)

## 2024-05-16 ENCOUNTER — Encounter (HOSPITAL_COMMUNITY)
Admission: RE | Admit: 2024-05-16 | Discharge: 2024-05-16 | Disposition: A | Source: Ambulatory Visit | Attending: Cardiology

## 2024-05-16 DIAGNOSIS — Z952 Presence of prosthetic heart valve: Secondary | ICD-10-CM

## 2024-05-19 ENCOUNTER — Encounter (HOSPITAL_COMMUNITY)
Admission: RE | Admit: 2024-05-19 | Discharge: 2024-05-19 | Disposition: A | Source: Ambulatory Visit | Attending: Cardiology

## 2024-05-19 ENCOUNTER — Other Ambulatory Visit: Payer: Self-pay | Admitting: Allergy and Immunology

## 2024-05-19 DIAGNOSIS — Z952 Presence of prosthetic heart valve: Secondary | ICD-10-CM

## 2024-05-20 ENCOUNTER — Ambulatory Visit (INDEPENDENT_AMBULATORY_CARE_PROVIDER_SITE_OTHER): Payer: Medicare Other | Admitting: Allergy and Immunology

## 2024-05-20 ENCOUNTER — Encounter: Payer: Self-pay | Admitting: Allergy and Immunology

## 2024-05-20 VITALS — BP 126/68 | HR 60 | Ht 60.5 in | Wt 160.0 lb

## 2024-05-20 DIAGNOSIS — J3089 Other allergic rhinitis: Secondary | ICD-10-CM

## 2024-05-20 DIAGNOSIS — K219 Gastro-esophageal reflux disease without esophagitis: Secondary | ICD-10-CM

## 2024-05-20 DIAGNOSIS — J4530 Mild persistent asthma, uncomplicated: Secondary | ICD-10-CM

## 2024-05-20 DIAGNOSIS — J301 Allergic rhinitis due to pollen: Secondary | ICD-10-CM | POA: Diagnosis not present

## 2024-05-20 DIAGNOSIS — J310 Chronic rhinitis: Secondary | ICD-10-CM

## 2024-05-20 NOTE — Patient Instructions (Addendum)
  1.  Continue to perform allergen avoidance measures - dust mite / pollens  2.  Continue to treat and prevent infalammation:   A.  Arnuity 200 - 1 inhalation 1 time per day  B.  Flonase  - 2 sprays each nostril 1-7 times per week  3.  Continue to treat and prevent reflux:   A.  Consolidate caffeine consumption  B.  Nexium  40 mg tablet in a.m.  4. If needed:   A.  Albuterol  HFA-2 inhalations every 4-6 hours  B.  antihistamine- Clarinex    C.  OTC nasal saline  D.  OTC Systane eyedrops  E.  Ipratropium 0.06% - 2 sprays each nostril every 6 hours to dry nose   5. Return to clinic in 12 months or earlier if problem  6. Influenza = Tamiflu. Covid = Paxlovid

## 2024-05-20 NOTE — Progress Notes (Unsigned)
 [place] [facility] [place]   Follow-up Note  Referring Provider: [name], NP Primary Provider: [name], NP Date of Office Visit: 05/20/2024  Subjective:   Taylor Stanley (DOB: Jul 18, 1956) is a 68 y.o. female who [facility] in re-evaluation of the following:  HPI: Taylor Stanley returns to this clinic in evaluation of asthma, allergic rhinitis, LPR.  I last saw her in this clinic 22 May 2023.  Since I have seen her in this clinic she has had a TAVR placed for severe aortic stenosis July 2025 with good result.  She has very little issues with her respiratory tract.  She can exert herself without any problem.  She rarely uses any short acting bronchodilator.  She continues to use Arnuity on a regular basis and has not required a systemic steroid to treat an exacerbation.  Her nose is doing quite well although she does have some problems with runny nose especially when she eats.  She has been using Flonase  on a pretty consistent basis.  She has not had to use an antibiotic treat an episode of sinusitis.  Reflux has been under pretty good control at this point in time while using her Nexium  once a day.  Occasionally she has some drainage in her throat and some throat clearing..  Allergies as of 05/20/2024       Reactions   Penicillins Hives, Swelling, Rash   Remicade [infliximab] Other (See Comments)   malaise/headache after infusion   Poison Ivy Extract Hives, Rash        Medication List    albuterol  108 (90 Base) MCG/ACT inhaler Commonly known as: VENTOLIN  HFA TAKE 2 PUFFS BY MOUTH EVERY 4 HOURS AS NEEDED   Arnuity Ellipta  200 MCG/ACT Aepb Generic drug: Fluticasone  Furoate INHALE 1 PUFF BY MOUTH DAILY   ascorbic acid 500 MG tablet Commonly known as: VITAMIN C Take 1,000 mg by mouth daily.   aspirin  EC 81 MG tablet Take 1 tablet (81 mg total) by mouth daily. Swallow  whole.   carvedilol  3.125 MG tablet Commonly known as: Coreg  Take 1 tablet (3.125 mg total) by mouth 2 (two) times daily with a meal.   cholecalciferol 1000 units tablet Commonly known as: VITAMIN D Take 1,000 Units by mouth in the morning.   clobetasol cream 0.05 % Commonly known as: TEMOVATE Apply 1 Application topically 2 (two) times daily as needed (rash).   desloratadine  5 MG tablet Commonly known as: CLARINEX  TAKE 1 TABLET BY MOUTH DAILY AS NEEDED   esomeprazole  40 MG capsule Commonly known as: NEXIUM  TAKE 1 CAPSULE BY MOUTH EVERY MORNING   fluticasone  50 MCG/ACT nasal spray Commonly known as: FLONASE  SPRAY 1-2 SPRAYS INTO EACH NOSTRIL 1-7 times per week.   ibuprofen 200 MG tablet Commonly known as: ADVIL Take 200 mg by mouth every 6 (six) hours as needed for moderate pain (pain score 4-6).   nitroGLYCERIN  0.4 MG SL tablet Commonly known as: NITROSTAT  Place 1 tablet (0.4 mg total) under the tongue every 5 (five) minutes as needed for chest pain.   olmesartan-hydrochlorothiazide 40-25 MG tablet Commonly known as: BENICAR HCT Take 1 tablet by mouth in the morning.   rosuvastatin  20 MG tablet Commonly known as: CRESTOR  Take 1 tablet (20 mg total) by mouth daily.   sertraline  100 MG tablet Commonly known as: ZOLOFT  Take 50 mg by mouth at bedtime.   SIMPONI ARIA IV Inject 1 Dose  into the vein every 2 (two) months.   triamcinolone cream 0.1 % Commonly known as: KENALOG Apply 1 Application topically 2 (two) times daily as needed (skin irritation).    Past Medical History:  Diagnosis Date   Anxiety    Asthma    triggered by allergies; does not use inhaler daily   Carpal tunnel syndrome of right wrist 11/2011   Dental crowns present    Depression    GERD (gastroesophageal reflux disease)    Hx of colonic polyps    Hypertension    under control; has been on med. x 5 yrs.   Osteopenia    PONV (postoperative nausea and vomiting)    S/P TAVR (transcatheter  aortic valve replacement) 01/29/2024   s/p TAVR with a 23 mm Edwards S3UR via the TF approach by Dr. Wonda and Dr. Lucas   Seasonal allergies    Seizure [facility]) as an infant   x 1 - unknown cause   Snores    denies apnea   TMJ disease    Wears glasses     Past Surgical History:  Procedure Laterality Date   CARDIAC CATHETERIZATION     CARPAL TUNNEL RELEASE  05/08/2007   left   CARPAL TUNNEL RELEASE  12/12/2011   Procedure: CARPAL TUNNEL RELEASE;  Surgeon: Lamar LULLA Leonor Mickey., MD;  Location: [facility];  Service: Orthopedics;  Laterality: Right;   catarac removal right eye Right 05/2019   CERVICAL DISC SURGERY  2011   CESAREAN SECTION  04/1993   BTL   CHOLECYSTECTOMY  07/07/2005   lap. chole.   ESOPHAGEAL DILATION  05/15/2003   INTRAOPERATIVE TRANSTHORACIC ECHOCARDIOGRAM N/A 01/29/2024   Procedure: ECHOCARDIOGRAM, TRANSTHORACIC;  Surgeon: Wonda Sharper, MD;  Location: [facility]ASIVE CV [facility];  Service: Cardiovascular;  Laterality: N/A;   LUMBAR DISC SURGERY  early 1990's   RIGHT HEART CATH AND CORONARY ANGIOGRAPHY N/A 11/26/2023   Procedure: RIGHT HEART CATH AND CORONARY ANGIOGRAPHY;  Surgeon: Elmira Newman PARAS, MD;  Location: [facility] INVASIVE CV LAB;  Service: Cardiovascular;  Laterality: N/A;   SPINE SURGERY     TONSILLECTOMY AND ADENOIDECTOMY  as a child   TRIGGER FINGER RELEASE Right 02/12/2014   Procedure: RELEASE TRIGGER FINGER/A-1 PULLEY RIGHT INDEX AND RIGHT LONG FINGERS;  Surgeon: Lamar Leonor Mickey LULLA, MD;  Location: [facility];  Service: Orthopedics;  Laterality: Right;   TUBAL LIGATION  1994   WISDOM TOOTH EXTRACTION      Review of systems negative except as noted in HPI / PMHx or noted below:  Review of Systems  Constitutional: Negative.   HENT: Negative.    Eyes: Negative.   Respiratory: Negative.    Cardiovascular: Negative.   Gastrointestinal: Negative.   Genitourinary: Negative.   Musculoskeletal: Negative.   Skin: Negative.    Neurological: Negative.   Endo/Heme/Allergies: Negative.   Psychiatric/Behavioral: Negative.       Objective:   Vitals:   05/20/24 1042  BP: 126/68  Pulse: 60  SpO2: 98%   Height: 5' 0.5 (153.7 cm)  Weight: 160 lb (72.6 kg)   Physical Exam Constitutional:      Appearance: She is not diaphoretic.  HENT:     Head: Normocephalic.     Right Ear: Tympanic membrane, ear canal and external ear normal.     Left Ear: Tympanic membrane, ear canal and external ear normal.     Nose: Nose normal. No mucosal edema or rhinorrhea.     Mouth/Throat:  Pharynx: Uvula midline. No oropharyngeal exudate.  Eyes:     Conjunctiva/sclera: Conjunctivae normal.  Neck:     Thyroid: No thyromegaly.     Trachea: Trachea normal. No tracheal tenderness or tracheal deviation.  Cardiovascular:     Rate and Rhythm: Normal rate and regular rhythm.     Heart sounds: Normal heart sounds, S1 normal and S2 normal. No murmur heard. Pulmonary:     Effort: No respiratory distress.     Breath sounds: Normal breath sounds. No stridor. No wheezing or rales.  Lymphadenopathy:     Head:     Right side of head: No tonsillar adenopathy.     Left side of head: No tonsillar adenopathy.     Cervical: No cervical adenopathy.  Skin:    Findings: No erythema or rash.     Nails: There is no clubbing.  Neurological:     Mental Status: She is alert.     Diagnostics: Spirometry was performed and demonstrated an FEV1 of 1.48 at 77 % of predicted.  Assessment and Plan:   1. Asthma, well controlled, mild persistent   2. Perennial allergic rhinitis   3. Seasonal allergic rhinitis due to pollen   4. LPRD (laryngopharyngeal reflux disease)    1.  Continue to perform allergen avoidance measures - dust mite / pollens  2.  Continue to treat and prevent infalammation:   A.  Arnuity 200 - 1 inhalation 1 time per day  B.  Flonase  - 2 sprays each nostril 1-7 times per week  3.  Continue to treat and prevent  reflux:   A.  Consolidate caffeine consumption  B.  Nexium  40 mg tablet in a.m.  4. If needed:   A.  Albuterol  HFA-2 inhalations every 4-6 hours  B.  antihistamine- Clarinex    C.  OTC nasal saline  D.  OTC Systane eyedrops  E.  Ipratropium 0.06% - 2 sprays each nostril every 6 hours to dry nose   5. Return to clinic in 12 months or earlier if problem  6. Influenza = Tamiflu. Covid = Paxlovid  Iyonnah is doing very well while using anti-inflammatory agents for both her upper and lower airway and also addressing her reflux with the use of her proton pump inhibitor.  She does have some gustatory rhinitis and she can use ipratropium as needed as specified above along with other agents should they be required.  Assuming she does well I will see her back in this clinic in 1 year or earlier if there is a problem.  Camellia Denis, MD Allergy / Immunology [facility]

## 2024-05-21 ENCOUNTER — Encounter (HOSPITAL_COMMUNITY)
Admission: RE | Admit: 2024-05-21 | Discharge: 2024-05-21 | Disposition: A | Source: Ambulatory Visit | Attending: Cardiology

## 2024-05-21 ENCOUNTER — Encounter: Payer: Self-pay | Admitting: Allergy and Immunology

## 2024-05-21 DIAGNOSIS — Z952 Presence of prosthetic heart valve: Secondary | ICD-10-CM

## 2024-05-21 MED ORDER — DESLORATADINE 5 MG PO TABS: 5.0000 mg | ORAL_TABLET | Freq: Every day | ORAL | 3 refills | Status: AC | PRN

## 2024-05-21 MED ORDER — ESOMEPRAZOLE MAGNESIUM 40 MG PO CPDR: 40.0000 mg | DELAYED_RELEASE_CAPSULE | Freq: Every morning | ORAL | 11 refills | Status: AC

## 2024-05-21 MED ORDER — ALBUTEROL SULFATE HFA 108 (90 BASE) MCG/ACT IN AERS: INHALATION_SPRAY | RESPIRATORY_TRACT | 2 refills | Status: AC

## 2024-05-21 MED ORDER — FLUTICASONE PROPIONATE 50 MCG/ACT NA SUSP: NASAL | 5 refills | Status: AC

## 2024-05-21 MED ORDER — IPRATROPIUM BROMIDE 0.06 % NA SOLN: 2.0000 | NASAL | 3 refills | Status: AC | PRN

## 2024-05-23 ENCOUNTER — Encounter (HOSPITAL_COMMUNITY)
Admission: RE | Admit: 2024-05-23 | Discharge: 2024-05-23 | Disposition: A | Discharge status: Home / Self Care | Source: Ambulatory Visit | Attending: Cardiology | Admitting: Cardiology

## 2024-05-23 DIAGNOSIS — Z952 Presence of prosthetic heart valve: Secondary | ICD-10-CM

## 2024-05-26 ENCOUNTER — Encounter (HOSPITAL_COMMUNITY)
Admission: RE | Admit: 2024-05-26 | Discharge: 2024-05-26 | Disposition: A | Source: Ambulatory Visit | Attending: Cardiology

## 2024-05-26 DIAGNOSIS — Z952 Presence of prosthetic heart valve: Secondary | ICD-10-CM | POA: Diagnosis not present

## 2024-05-28 ENCOUNTER — Encounter (HOSPITAL_COMMUNITY)
Admission: RE | Admit: 2024-05-28 | Discharge: 2024-05-28 | Disposition: A | Discharge status: Home / Self Care | Source: Ambulatory Visit | Attending: Cardiology | Admitting: Cardiology

## 2024-05-28 DIAGNOSIS — Z952 Presence of prosthetic heart valve: Secondary | ICD-10-CM

## 2024-05-30 ENCOUNTER — Encounter (HOSPITAL_COMMUNITY)
Admission: RE | Admit: 2024-05-30 | Discharge: 2024-05-30 | Disposition: A | Discharge status: Home / Self Care | Source: Ambulatory Visit | Attending: Cardiology | Admitting: Cardiology

## 2024-05-30 DIAGNOSIS — Z952 Presence of prosthetic heart valve: Secondary | ICD-10-CM | POA: Diagnosis not present

## 2024-06-02 ENCOUNTER — Encounter (HOSPITAL_COMMUNITY)
Admission: RE | Admit: 2024-06-02 | Discharge: 2024-06-02 | Disposition: A | Discharge status: Home / Self Care | Source: Ambulatory Visit | Attending: Cardiology | Admitting: Cardiology

## 2024-06-02 DIAGNOSIS — Z952 Presence of prosthetic heart valve: Secondary | ICD-10-CM | POA: Diagnosis present

## 2024-06-03 NOTE — Progress Notes (Signed)
 Cardiac Individual Treatment Plan  Patient Details  Name: Taylor Stanley MRN: [id number] Date of Birth: 1956-05-18 Referring Provider:   Flowsheet Row INTENSIVE CARDIAC REHAB ORIENT from 02/26/2024 in [facility]  Referring Provider Newman Lawrence, MD    Initial Encounter Date:  Flowsheet Row INTENSIVE CARDIAC REHAB ORIENT from 02/26/2024 in [facility]  Date 02/26/24    Visit Diagnosis: 01/29/24 S/P TAVR  Patient's Home Medications on Admission:  Current Outpatient Medications:    albuterol  (VENTOLIN  HFA) 108 (90 Base) MCG/ACT inhaler, TAKE 2 PUFFS BY MOUTH EVERY 4 HOURS AS NEEDED, Disp: 18 g, Rfl: 2   ascorbic acid (VITAMIN C) 500 MG tablet, Take 1,000 mg by mouth daily., Disp: , Rfl:    aspirin  EC 81 MG tablet, Take 1 tablet (81 mg total) by mouth daily. Swallow whole., Disp: 90 tablet, Rfl: 2   carvedilol  (COREG ) 3.125 MG tablet, Take 1 tablet (3.125 mg total) by mouth 2 (two) times daily with a meal., Disp: 60 tablet, Rfl: 0   cholecalciferol (VITAMIN D) 1000 UNITS tablet, Take 1,000 Units by mouth in the morning., Disp: , Rfl:    clobetasol cream (TEMOVATE) 0.05 %, Apply 1 Application topically 2 (two) times daily as needed (rash)., Disp: , Rfl:    desloratadine  (CLARINEX ) 5 MG tablet, Take 1 tablet (5 mg total) by mouth daily as needed., Disp: 30 tablet, Rfl: 3   esomeprazole  (NEXIUM ) 40 MG capsule, Take 1 capsule (40 mg total) by mouth every morning., Disp: 30 capsule, Rfl: 11   fluticasone  (FLONASE ) 50 MCG/ACT nasal spray, SPRAY 1-2 SPRAYS INTO EACH NOSTRIL 1-7 times per week., Disp: 16 g, Rfl: 5   Fluticasone  Furoate (ARNUITY ELLIPTA ) 200 MCG/ACT AEPB, INHALE 1 PUFF BY MOUTH DAILY, Disp: 90 each, Rfl: 5   Golimumab (SIMPONI ARIA IV), Inject 1 Dose into the vein every 2 (two) months., Disp: , Rfl:    ibuprofen (ADVIL) 200 MG tablet, Take 200 mg by mouth every  6 (six) hours as needed for moderate pain (pain score 4-6)., Disp: , Rfl:    ipratropium (ATROVENT) 0.06 % nasal spray, Place 2 sprays into both nostrils as needed for rhinitis (Every 6 hours for drying nose)., Disp: 15 mL, Rfl: 3   nitroGLYCERIN  (NITROSTAT ) 0.4 MG SL tablet, Place 1 tablet (0.4 mg total) under the tongue every 5 (five) minutes as needed for chest pain., Disp: 25 tablet, Rfl: 4   olmesartan-hydrochlorothiazide (BENICAR HCT) 40-25 MG tablet, Take 1 tablet by mouth in the morning., Disp: , Rfl:    rosuvastatin  (CRESTOR ) 20 MG tablet, Take 1 tablet (20 mg total) by mouth daily., Disp: 90 tablet, Rfl: 2   sertraline  (ZOLOFT ) 100 MG tablet, Take 50 mg by mouth at bedtime., Disp: , Rfl:    triamcinolone cream (KENALOG) 0.1 %, Apply 1 Application topically 2 (two) times daily as needed (skin irritation)., Disp: , Rfl:   Past Medical History: Past Medical History:  Diagnosis Date   Anxiety    Asthma    triggered by allergies; does not use inhaler daily   Carpal tunnel syndrome of right wrist 11/2011   Dental crowns present    Depression    GERD (gastroesophageal reflux disease)    Hx of colonic polyps    Hypertension    under control; has been on med. x 5 yrs.   Osteopenia    PONV (postoperative nausea and vomiting)  S/P TAVR (transcatheter aortic valve replacement) 01/29/2024   s/p TAVR with a 23 mm Edwards S3UR via the TF approach by Dr. Wonda and Dr. Lucas   Seasonal allergies    Seizure [facility]) as an infant   x 1 - unknown cause   Snores    denies apnea   TMJ disease    Wears glasses     Tobacco Use: Social History   Tobacco Use  Smoking Status Former   Current packs/day: 0.00   Average packs/day: 1 pack/day for 13.0 years (13.0 ttl pk-yrs)   Types: Cigarettes   Start date: 02/11/1992   Quit date: 02/10/2005   Years since quitting: 19.3  Smokeless Tobacco Never    Labs: Review Flowsheet  More data exists      Latest Ref Rng & Units 06/23/2013 02/22/2022  10/20/2022 11/26/2023 01/29/2024  Labs for ITP Cardiac and Pulmonary Rehab  Cholestrol 100 - 199 mg/dL 839  94  889  - -  LDL (calc) 0 - 99 mg/dL 892  41  57  - -  HDL-C >39 mg/dL 69.69  33  36  - -  Trlycerides 0 - 149 mg/dL 886.9  895  86  - -  PH, Arterial 7.35 - 7.45 - - - 7.418  -  PCO2 arterial 32 - 48 mmHg - - - 40.2  -  Bicarbonate 20.0 - 28.0 mmol/L - - - 27.7  25.9  28.2  -  TCO2 22 - 32 mmol/L - - - 29  27  30  26    O2 Saturation % - - - 76  96  75  -    Details       Multiple values from one day are sorted in reverse-chronological order         Capillary Blood Glucose: No results found for: GLUCAP   Exercise Target Goals: Exercise Program Goal: Individual exercise prescription set using results from initial 6 min walk test and THRR while considering  patient's activity barriers and safety.   Exercise Prescription Goal: Initial exercise prescription builds to 30-45 minutes a day of aerobic activity, 2-3 days per week.  Home exercise guidelines will be given to patient during program as part of exercise prescription that the participant will acknowledge.  Activity Barriers & Risk Stratification:  Activity Barriers & Cardiac Risk Stratification - 02/26/24 1311       Activity Barriers & Cardiac Risk Stratification   Activity Barriers Arthritis;Joint Problems;Balance Concerns;Shortness of Breath;Neck/Spine Problems;Back Problems    Cardiac Risk Stratification High          6 Minute Walk:  6 Minute Walk     Row Name 02/26/24 1310         6 Minute Walk   Phase Initial     Distance 1514 feet     Walk Time 6 minutes     # of Rest Breaks 0     MPH 2.9     METS 2.9     RPE 7     Perceived Dyspnea  1     VO2 Peak 10.2     Symptoms Yes (comment)     Comments Mild SOB, RPD = 1, Chronic left hip pain 2/10     Resting HR 62 bpm     Resting BP 138/76     Resting Oxygen Saturation  95 %     Exercise Oxygen Saturation  during 6 min walk 96 %     Max Ex. HR 82  bpm     Max Ex. BP 152/80     2 Minute Post BP 130/80        Oxygen Initial Assessment:   Oxygen Re-Evaluation:   Oxygen Discharge (Final Oxygen Re-Evaluation):   Initial Exercise Prescription:  Initial Exercise Prescription - 02/26/24 1300       Date of Initial Exercise RX and Referring Provider   Date 02/26/24    Referring Provider Newman Lawrence, MD    Expected Discharge Date 05/14/24      NuStep   Level 2    SPM 75    Minutes 15    METs 2.9      Recumbant Elliptical   Level 2    RPM 60    Watts 75    Minutes 15    METs 2.9      Prescription Details   Frequency (times per week) 3    Duration Progress to 30 minutes of continuous aerobic without signs/symptoms of physical distress      Intensity   THRR 40-80% of Max Heartrate 60-120    Ratings of Perceived Exertion 11-13    Perceived Dyspnea 0-4      Progression   Progression Continue progressive overload as per policy without signs/symptoms or physical distress.      Resistance Training   Training Prescription Yes    Weight 3 lbs    Reps 10-15          Perform Capillary Blood Glucose checks as needed.  Exercise Prescription Changes:   Exercise Prescription Changes     Row Name 03/03/24 1400 04/02/24 1500 05/02/24 1600 05/05/24 1538 05/16/24 1500     Response to Exercise   Blood Pressure (Admit) 134/70 132/72 148/62 114/72 134/70   Blood Pressure (Exercise) 164/82 -- -- -- --   Blood Pressure (Exit) 122/62 108/68 130/74 108/68 132/66   Heart Rate (Admit) 59 bpm 54 bpm 56 bpm 55 bpm 57 bpm   Heart Rate (Exercise) 80 bpm 90 bpm 84 bpm 99 bpm 97 bpm   Heart Rate (Exit) 63 bpm 63 bpm 62 bpm 64 bpm 62 bpm   Rating of Perceived Exertion (Exercise) 13 11 10 10 10    Symptoms None None None None None   Comments Pt's first day in the CRP2 program Reviewed METs Reviewed goals and METs Reviewed home exercise Rx Reviewed METs   Duration Continue with 30 min of aerobic exercise without signs/symptoms  of physical distress. Continue with 30 min of aerobic exercise without signs/symptoms of physical distress. Continue with 30 min of aerobic exercise without signs/symptoms of physical distress. Continue with 30 min of aerobic exercise without signs/symptoms of physical distress. Continue with 30 min of aerobic exercise without signs/symptoms of physical distress.   Intensity THRR unchanged THRR unchanged THRR unchanged THRR unchanged THRR unchanged     Progression   Progression Continue to progress workloads to maintain intensity without signs/symptoms of physical distress. Continue to progress workloads to maintain intensity without signs/symptoms of physical distress. Continue to progress workloads to maintain intensity without signs/symptoms of physical distress. Continue to progress workloads to maintain intensity without signs/symptoms of physical distress. Continue to progress workloads to maintain intensity without signs/symptoms of physical distress.   Average METs 2.6 3.4 3.2 3.3 3.55     Resistance Training   Training Prescription Yes No Yes Yes Yes   Weight 3 lbs No wts on Wednesdays 2 lbs 2 lbs 2 lbs   Reps 10-15 -- 10-15 10-15 10-15  Time 5 Minutes -- 5 Minutes 5 Minutes 5 Minutes     Interval Training   Interval Training No No No No No     Treadmill   MPH -- -- 2.4 2.4 2.5   Grade -- -- 0 0 1   Minutes -- -- 15 15 15    METs -- -- 2.84 2.84 3.26     NuStep   Level 2 3 4 5 5    SPM 77 99 94 98 99   Minutes 15 15 15 15 15    METs 2.2 3.2 3.5 3.8 3.8     Recumbant Elliptical   Level 2 -- -- -- --   RPM 150 -- -- -- --   Watts 64 -- -- -- --   Minutes 15 -- -- -- --   METs 3 -- -- -- --     Track   Laps -- 20 -- -- --   Minutes -- 15 -- -- --   METs -- 3.55 -- -- --     Home Exercise Plan   Plans to continue exercise at -- -- -- Home (comment) Home (comment)   Frequency -- -- -- Add 2 additional days to program exercise sessions. Add 2 additional days to program  exercise sessions.   Initial Home Exercises Provided -- -- -- 05/05/24 05/05/24    Row Name 05/28/24 1500             Response to Exercise   Blood Pressure (Admit) 118/60       Blood Pressure (Exit) 110/60       Heart Rate (Admit) 56 bpm       Heart Rate (Exercise) 112 bpm       Heart Rate (Exit) 65 bpm       Rating of Perceived Exertion (Exercise) 10       Symptoms None       Comments Reviewed METs and goals       Duration Continue with 30 min of aerobic exercise without signs/symptoms of physical distress.       Intensity THRR unchanged         Progression   Progression Continue to progress workloads to maintain intensity without signs/symptoms of physical distress.       Average METs 3.8         Resistance Training   Training Prescription No       Weight No wts on wednesdays         Interval Training   Interval Training No         Treadmill   MPH 2.7       Grade 1       Minutes 15       METs 3.44         NuStep   Level 5       SPM 96       Minutes 15       METs 4.2         Home Exercise Plan   Plans to continue exercise at Home (comment)       Frequency Add 2 additional days to program exercise sessions.       Initial Home Exercises Provided 05/05/24          Exercise Comments:   Exercise Comments     Row Name 03/03/24 1422 04/02/24 0150 04/02/24 1500 04/21/24 1607 05/02/24 1648   Exercise Comments Pt's first day in the CRP2 program. Pt tolerated initial session well.  Pt did complain that she found the seat on the [employer] uncomfortable on her buttocks; will reassess next session. -- Reviewed METs. Pt is progressing well. Pt dropped Oct[employer] because it was uncomfortable for her back, using track and doing very well. Reviewed METs. Pt is progressing well. Pt has been out of town and returned tot he program today. No workload changres. Pt report walking daily while out of town. Reviwed METs and goals. Pt is progressing and will increase workload on nustep next  session. Average METs have dropped as pt in not walking on the track anymore and is now on the treadmill. Pt has been getting used to it and will be able to begin increasing workloads.    Row Name 05/05/24 1531 05/16/24 1616 05/28/24 1500       Exercise Comments Reviewed home exercise Rx. Pt will walk with her husband or go to her gym ([employer] to exercise). Pt will add 2 days of exercise for 30 minutes to achieve 150 + minutes of exercise per week. Reviewed METs with patient today. Pt is making progress. Pt increased workload on treadmill today with no complaints. Reviewed MET and goals. Pt continues to make slow progress on increasing MET levels.        Exercise Goals and Review:   Exercise Goals     Row Name 02/26/24 1314             Exercise Goals   Increase Physical Activity Yes       Intervention Provide advice, education, support and counseling about physical activity/exercise needs.;Develop an individualized exercise prescription for aerobic and resistive training based on initial evaluation findings, risk stratification, comorbidities and participant's personal goals.       Expected Outcomes Long Term: Exercising regularly at least 3-5 days a week.;Short Term: Attend rehab on a regular basis to increase amount of physical activity.;Long Term: Add in home exercise to make exercise part of routine and to increase amount of physical activity.       Increase Strength and Stamina Yes       Intervention Provide advice, education, support and counseling about physical activity/exercise needs.;Develop an individualized exercise prescription for aerobic and resistive training based on initial evaluation findings, risk stratification, comorbidities and participant's personal goals.       Expected Outcomes Short Term: Increase workloads from initial exercise prescription for resistance, speed, and METs.;Short Term: Perform resistance training exercises routinely during rehab and add in  resistance training at home;Long Term: Improve cardiorespiratory fitness, muscular endurance and strength as measured by increased METs and functional capacity ( )       Able to understand and use rate of perceived exertion (RPE) scale Yes       Intervention Provide education and explanation on how to use RPE scale       Expected Outcomes Short Term: Able to use RPE daily in rehab to express subjective intensity level;Long Term:  Able to use RPE to guide intensity level when exercising independently       Knowledge and understanding of Target Heart Rate Range (THRR) Yes       Intervention Provide education and explanation of THRR including how the numbers were predicted and where they are located for reference       Expected Outcomes Long Term: Able to use THRR to govern intensity when exercising independently;Short Term: Able to use daily as guideline for intensity in rehab;Short Term: Able to state/look up THRR       Understanding of  Exercise Prescription Yes       Intervention Provide education, explanation, and written materials on patient's individual exercise prescription       Expected Outcomes Short Term: Able to explain program exercise prescription;Long Term: Able to explain home exercise prescription to exercise independently          Exercise Goals Re-Evaluation :  Exercise Goals Re-Evaluation     Row Name 03/03/24 1421 04/04/24 1539 05/02/24 1646 05/28/24 1500       Exercise Goal Re-Evaluation   Exercise Goals Review Increase Physical Activity;Understanding of Exercise Prescription;Increase Strength and Stamina;Knowledge and understanding of Target Heart Rate Range (THRR);Able to understand and use rate of perceived exertion (RPE) scale Increase Physical Activity;Understanding of Exercise Prescription;Increase Strength and Stamina;Knowledge and understanding of Target Heart Rate Range (THRR);Able to understand and use rate of perceived exertion (RPE) scale Increase Physical  Activity;Understanding of Exercise Prescription;Increase Strength and Stamina;Knowledge and understanding of Target Heart Rate Range (THRR);Able to understand and use rate of perceived exertion (RPE) scale Increase Physical Activity;Understanding of Exercise Prescription;Increase Strength and Stamina;Knowledge and understanding of Target Heart Rate Range (THRR);Able to understand and use rate of perceived exertion (RPE) scale    Comments Pt's first day in the CRP2 program. Pt understands the exercise Rx, RPE scale and THRR. Reviewed goals with patient today. Pt voices progress on her goals of increased endurance and muscular strength. Pt has goal of weight loss hand has lost about 1.4 kg. Reviewed goals and METs with patient today. Pt voices continued progress on her goals of increased endurance and muscular strength. Pt's weight change from basline is 1 kg. Reviewed goals and METs with patient today. Pt voices continued progress on her goals of increased endurance and muscular strength. Pt's weight has not had any significant change since last review.    Expected Outcomes Will continue to monitor the patient and increase exercise workloads as tolerated. Will continue to monitor the patient and increase exercise workloads as tolerated. Will continue to monitor the patient and increase exercise workloads as tolerated. Will continue to monitor the patient and increase exercise workloads as tolerated.       Discharge Exercise Prescription (Final Exercise Prescription Changes):  Exercise Prescription Changes - 05/28/24 1500       Response to Exercise   Blood Pressure (Admit) 118/60    Blood Pressure (Exit) 110/60    Heart Rate (Admit) 56 bpm    Heart Rate (Exercise) 112 bpm    Heart Rate (Exit) 65 bpm    Rating of Perceived Exertion (Exercise) 10    Symptoms None    Comments Reviewed METs and goals    Duration Continue with 30 min of aerobic exercise without signs/symptoms of physical distress.     Intensity THRR unchanged      Progression   Progression Continue to progress workloads to maintain intensity without signs/symptoms of physical distress.    Average METs 3.8      Resistance Training   Training Prescription No    Weight No wts on wednesdays      Interval Training   Interval Training No      Treadmill   MPH 2.7    Grade 1    Minutes 15    METs 3.44      NuStep   Level 5    SPM 96    Minutes 15    METs 4.2      Home Exercise Plan   Plans to continue exercise at Home (comment)  Frequency Add 2 additional days to program exercise sessions.    Initial Home Exercises Provided 05/05/24          Nutrition:  Target Goals: Understanding of nutrition guidelines, daily intake of sodium 1500mg , cholesterol 200mg , calories 30% from fat and 7% or less from saturated fats, daily to have 5 or more servings of fruits and vegetables.  Biometrics:  Pre Biometrics - 02/26/24 0937       Pre Biometrics   Waist Circumference 39.5 inches    Hip Circumference 43 inches    Waist to Hip Ratio 0.92 %    Triceps Skinfold 30 mm    % Body Fat 43.8 %    Grip Strength 14 kg    Flexibility 14.25 in    Single Leg Stand 4.18 seconds           Nutrition Therapy Plan and Nutrition Goals:  Nutrition Therapy & Goals - 03/05/24 0834       Nutrition Therapy   Diet Heart Healthy Diet    Drug/Food Interactions Statins/Certain Fruits      Personal Nutrition Goals   Nutrition Goal Patient to identify strategies for reducing cardiovascular risk by attending the [employer] education and nutrition series weekly.    Personal Goal #2 Patient to improve diet quality by using the plate method as a guide for meal planning to include lean protein/plant protein, fruits, vegetables, whole grains, nonfat dairy as part of a well-balanced diet.    Comments Patient has medical history of TAVR, HTN, CAD, hyperlipidemia, hx of tobacco abuse, rheumatoid arthritis. Lipids are well controlled.  Patient will benefit from participation in intensive cardiac rehab for nutrition education, exercise, and lifestyle modification.      Intervention Plan   Intervention Prescribe, educate and counsel regarding individualized specific dietary modifications aiming towards targeted core components such as weight, hypertension, lipid management, diabetes, heart failure and other comorbidities.;Nutrition handout(s) given to patient.    Expected Outcomes Short Term Goal: Understand basic principles of dietary content, such as calories, fat, sodium, cholesterol and nutrients.;Long Term Goal: Adherence to prescribed nutrition plan.          Nutrition Assessments:  Nutrition Assessments - 05/13/24 0836       Rate Your Plate Scores   Pre Score 61         MEDIFICTS Score Key: >=70 Need to make dietary changes  40-70 Heart Healthy Diet <= 40 Therapeutic Level Cholesterol Diet   Flowsheet Row INTENSIVE CARDIAC REHAB from 05/12/2024 in [facility]  Picture Your Plate Total Score on Admission 61  Picture Your Plate Total Score on Discharge 71   Picture Your Plate Scores: <59 Unhealthy dietary pattern with much room for improvement. 41-50 Dietary pattern unlikely to meet recommendations for good health and room for improvement. 51-60 More healthful dietary pattern, with some room for improvement.  >60 Healthy dietary pattern, although there may be some specific behaviors that could be improved.    Nutrition Goals Re-Evaluation:  Nutrition Goals Re-Evaluation     Row Name 03/05/24 0834             Goals   Current Weight 163 lb 9.3 oz (74.2 kg)       Comment Lpa WNL, HDl 36, LDL 57       Expected Outcome Patient has medical history of TAVR, HTN, CAD, hyperlipidemia, hx of tobacco abuse, rheumatoid arthritis. Lipids are well controlled. Patient will benefit from participation in  intensive cardiac rehab for nutrition education,  exercise, and lifestyle modification.          Nutrition Goals Re-Evaluation:  Nutrition Goals Re-Evaluation     Row Name 03/05/24 0834             Goals   Current Weight 163 lb 9.3 oz (74.2 kg)       Comment Lpa WNL, HDl 36, LDL 57       Expected Outcome Patient has medical history of TAVR, HTN, CAD, hyperlipidemia, hx of tobacco abuse, rheumatoid arthritis. Lipids are well controlled. Patient will benefit from participation in intensive cardiac rehab for nutrition education, exercise, and lifestyle modification.          Nutrition Goals Discharge (Final Nutrition Goals Re-Evaluation):  Nutrition Goals Re-Evaluation - 03/05/24 0834       Goals   Current Weight 163 lb 9.3 oz (74.2 kg)    Comment Lpa WNL, HDl 36, LDL 57    Expected Outcome Patient has medical history of TAVR, HTN, CAD, hyperlipidemia, hx of tobacco abuse, rheumatoid arthritis. Lipids are well controlled. Patient will benefit from participation in intensive cardiac rehab for nutrition education, exercise, and lifestyle modification.          Psychosocial: Target Goals: Acknowledge presence or absence of significant depression and/or stress, maximize coping skills, provide positive support system. Participant is able to verbalize types and ability to use techniques and skills needed for reducing stress and depression.  Initial Review & Psychosocial Screening:  Initial Psych Review & Screening - 02/26/24 1053       Initial Review   Current issues with Current Anxiety/Panic      Family Dynamics   Good Support System? Yes   Maurine has her husband and two children and a grandson brother and sister for support.     Barriers   Psychosocial barriers to participate in program There are no identifiable barriers or psychosocial needs.      Screening Interventions   Interventions Encouraged to exercise          Quality of Life Scores:  Quality of Life - 05/13/24 0832       Quality of Life Scores    Health/Function Pre 25.2 %    Health/Function Post 24.23 %    Health/Function % Change -3.85 %    Socioeconomic Pre 27.14 %    Socioeconomic Post 22.5 %    Socioeconomic % Change  -17.1 %    Psych/Spiritual Pre 28.93 %    Psych/Spiritual Post 28.29 %    Psych/Spiritual % Change -2.21 %    Family Pre 28 %    Family Post 27.6 %    Family % Change -1.43 %    GLOBAL Pre 26.78 %    GLOBAL Post 25.38 %    GLOBAL % Change -5.23 %         Scores of 19 and below usually indicate a poorer quality of life in these areas.  A difference of  2-3 points is a clinically meaningful difference.  A difference of 2-3 points in the total score of the Quality of Life Index has been associated with significant improvement in overall quality of life, self-image, physical symptoms, and general health in studies assessing change in quality of life.  PHQ-9: Review Flowsheet       02/26/2024 07/21/2013  Depression screen PHQ 2/9  Decreased Interest 0 0  Down, Depressed, Hopeless 0 0  PHQ - 2 Score 0 0  Altered sleeping  0 -  Tired, decreased energy 1 -  Change in appetite 0 -  Feeling bad or failure about yourself  0 -  Trouble concentrating 0 -  Moving slowly or fidgety/restless 0 -  Suicidal thoughts 0 -  PHQ-9 Score 1 -  Difficult doing work/chores Not difficult at all -   Interpretation of Total Score  Total Score Depression Severity:  1-4 = Minimal depression, 5-9 = Mild depression, 10-14 = Moderate depression, 15-19 = Moderately severe depression, 20-27 = Severe depression   Psychosocial Evaluation and Intervention:   Psychosocial Re-Evaluation:  Psychosocial Re-Evaluation     Row Name 03/03/24 1435 03/07/24 1445 04/04/24 1628 05/28/24 1644       Psychosocial Re-Evaluation   Current issues with Current Anxiety/Panic Current Anxiety/Panic Current Anxiety/Panic Current Anxiety/Panic    Comments Pt started cardiac rehab today (02/18/24). He did not voice any concerns or stressors. Pt has  not voiced any increased concerns or stressors during exercise at cardiac rehab. Pt has not voiced any increased concerns or stressors during exercise at cardiac rehab. Pt has not voiced any increased concerns or stressors during exercise at cardiac rehab.    Expected Outcomes Pt will have controlled/reduced stress and anxiety by completion of cardiac rehab. Pt will have controlled/reduced stress and anxiety by completion of cardiac rehab. Pt will have controlled/reduced stress and anxiety by completion of cardiac rehab. Pt will have controlled/reduced stress and anxiety by completion of cardiac rehab.    Interventions Encouraged to attend Cardiac Rehabilitation for the exercise;Stress management education;Relaxation education Encouraged to attend Cardiac Rehabilitation for the exercise;Stress management education;Relaxation education Encouraged to attend Cardiac Rehabilitation for the exercise;Stress management education;Relaxation education Encouraged to attend Cardiac Rehabilitation for the exercise;Stress management education;Relaxation education    Continue Psychosocial Services  Follow up required by staff  Will continue to monitor and offer support as needed. Follow up required by staff  will continue to monitor and offer support as needed Follow up required by staff  will continue to monitor and offer support as needed No Follow up required  will continue to monitor and offer support as needed       Psychosocial Discharge (Final Psychosocial Re-Evaluation):  Psychosocial Re-Evaluation - 05/28/24 1644       Psychosocial Re-Evaluation   Current issues with Current Anxiety/Panic    Comments Pt has not voiced any increased concerns or stressors during exercise at cardiac rehab.    Expected Outcomes Pt will have controlled/reduced stress and anxiety by completion of cardiac rehab.    Interventions Encouraged to attend Cardiac Rehabilitation for the exercise;Stress management education;Relaxation  education    Continue Psychosocial Services  No Follow up required   will continue to monitor and offer support as needed         Vocational Rehabilitation: Provide vocational rehab assistance to qualifying candidates.   Vocational Rehab Evaluation & Intervention:  Vocational Rehab - 02/26/24 1054       Initial Vocational Rehab Evaluation & Intervention   Assessment shows need for Vocational Rehabilitation No   [name] is retired and does not need vocational rehab at this time         Education: Education Goals: Education classes will be provided on a weekly basis, covering required topics. Participant will state understanding/return demonstration of topics presented.    Education     Row Name 03/03/24 1200     Education   Cardiac Education Topics Pritikin   Immunologist  Dietitian   Select Nutrition   Nutrition Workshop Label Reading   Instruction Review Code 1- Verbalizes Understanding   Class Start Time 1145   Class Stop Time 1230   Class Time Calculation (min) 45 min    Row Name 03/07/24 1400     Education   Cardiac Education Topics Pritikin   Nurse, Children's   Educator Dietitian   Select Nutrition   Nutrition Other   Instruction Review Code 1- Verbalizes Understanding    Row Name 03/10/24 1200     Education   Cardiac Education Topics Pritikin   Hospital Doctor Education   General Education Metabolic Syndrome and Belly Fat   Instruction Review Code 1- Verbalizes Understanding   Class Start Time 1200   Class Stop Time 1235   Class Time Calculation (min) 35 min    Row Name 03/12/24 1200     Education   Cardiac Education Topics Pritikin   Set Designer Exercise Physiologist   Weekly Topic Personalizing Your Pritikin Plate   Instruction Review Code 1- Verbalizes Understanding   Class Start  Time 1145   Class Stop Time 1225   Class Time Calculation (min) 40 min    Row Name 03/14/24 1200     Education   Cardiac Education Topics Pritikin   Licensed Conveyancer Nutrition   Nutrition Overview of the Pritikin Eating Plan   Instruction Review Code 1- Verbalizes Understanding   Class Start Time 1145   Class Stop Time 1225   Class Time Calculation (min) 40 min    Row Name 03/17/24 1300     Education   Cardiac Education Topics Pritikin   Select Workshops     Workshops   Educator Exercise Physiologist   Select Psychosocial   Psychosocial Workshop Recognizing and Reducing Stress   Instruction Review Code 1- Verbalizes Understanding   Class Start Time 1150   Class Stop Time 1232   Class Time Calculation (min) 42 min    Row Name 03/19/24 1200     Education   Cardiac Education Topics Pritikin   Orthoptist   Educator Dietitian   Weekly Topic Delicious Desserts   Instruction Review Code 1- Verbalizes Understanding   Class Start Time 1145   Class Stop Time 1225   Class Time Calculation (min) 40 min    Row Name 03/21/24 1300     Education   Cardiac Education Topics Pritikin   Nurse, Children's   Educator Dietitian   Select Nutrition   Nutrition Calorie Density   Instruction Review Code 1- Verbalizes Understanding   Class Start Time 1145   Class Stop Time 1225   Class Time Calculation (min) 40 min    Row Name 03/24/24 1100     Education   Cardiac Education Topics Pritikin   Select Workshops     Workshops   Educator Exercise Physiologist   Select Exercise   Exercise Workshop Exercise Basics: Building Your Action Plan   Instruction Review Code 1- Verbalizes Understanding   Class Start Time 1155   Class Stop Time 1248   Class Time Calculation (min) 53 min    Row Name 03/26/24 1200  Education   Cardiac Education Topics Pritikin   Nurse, Learning Disability - Meals in a Snap   Instruction Review Code 1- Verbalizes Understanding    Row Name 03/28/24 1200     Education   Cardiac Education Topics Pritikin   Psychologist, Forensic Exercise Education   Exercise Education Move It!   Instruction Review Code 1- Verbalizes Understanding   Class Start Time 1145   Class Stop Time 1224   Class Time Calculation (min) 39 min    Row Name 04/02/24 1300     Education   Cardiac Education Topics Pritikin   Orthoptist   Educator Dietitian   Weekly Topic One-Pot Wonders   Instruction Review Code 1- Verbalizes Understanding   Class Start Time 1146   Class Stop Time 1225   Class Time Calculation (min) 39 min    Row Name 04/04/24 1300     Education   Cardiac Education Topics Pritikin   Writer General Education   General Education Hypertension and Heart Disease   Instruction Review Code 1- Verbalizes Understanding   Class Start Time 1153   Class Stop Time 1230   Class Time Calculation (min) 37 min    Row Name 04/21/24 1300     Education   Cardiac Education Topics Pritikin   Psychologist, Forensic Exercise Education   Exercise Education Improving Performance   Instruction Review Code 1- Verbalizes Understanding   Class Start Time 1150   Class Stop Time 1230   Class Time Calculation (min) 40 min    Row Name 04/23/24 1300     Education   Cardiac Education Topics Pritikin   Customer Service Manager   Weekly Topic International Cuisine- Spotlight on the [employer] Zones   Instruction Review Code 1- Verbalizes Understanding   Class Start Time 1145   Class Stop Time 1225   Class Time Calculation (min) 40 min    Row Name 04/25/24  1100     Education   Cardiac Education Topics Pritikin   Glass Blower/designer Nutrition   Nutrition Workshop Fueling a Forensic Psychologist   Instruction Review Code 1- Tax Inspector   Class Start Time 1157   Class Stop Time 1230   Class Time Calculation (min) 33 min    Row Name 04/28/24 1300     Education   Cardiac Education Topics Pritikin   Geographical Information Systems Officer Psychosocial   Psychosocial Workshop Healthy Sleep for a Healthy Heart   Instruction Review Code 1- Verbalizes Understanding   Class Start Time 1153   Class Stop Time 1235   Class Time Calculation (min) 42 min    Row Name 04/30/24 1200     Education   Cardiac Education Topics Pritikin   Customer Service Manager   Weekly Topic Simple Sides and Sauces   Instruction Review Code 1- Teaching Laboratory Technician Start  Time 1155   Class Stop Time 1230   Class Time Calculation (min) 35 min    Row Name 05/02/24 1100     Education   Cardiac Education Topics Pritikin   Select Core Videos     Core Videos   Educator Exercise Physiologist   Select Psychosocial   Psychosocial How Our Thoughts Can Heal Our Hearts   Instruction Review Code 1- Verbalizes Understanding   Class Start Time 1200   Class Stop Time 1240   Class Time Calculation (min) 40 min    Row Name 05/05/24 1200     Education   Cardiac Education Topics Pritikin   Hospital Doctor Education   General Education Heart Disease Risk Reduction   Instruction Review Code 1- Verbalizes Understanding   Class Start Time 1150   Class Stop Time 1228   Class Time Calculation (min) 38 min    Row Name 05/07/24 1300     Education   Cardiac Education Topics Pritikin   Orthoptist   Educator Dietitian   Weekly Topic  Powerhouse Plant-Based Proteins   Instruction Review Code 1- Verbalizes Understanding   Class Start Time 1145   Class Stop Time 1216   Class Time Calculation (min) 31 min    Row Name 05/09/24 1200     Education   Cardiac Education Topics Pritikin   Select Core Videos     Core Videos   Educator Dietitian   Select Nutrition   Nutrition Facts on Fat   Instruction Review Code 1- Verbalizes Understanding   Class Start Time 1144   Class Stop Time 1217   Class Time Calculation (min) 33 min    Row Name 05/16/24 1200     Education   Cardiac Education Topics Pritikin   Select Workshops     Workshops   Educator Exercise Physiologist   Select Exercise   Exercise Workshop Managing Heart Disease: Your Path to a Healthier Heart   Instruction Review Code 1- Verbalizes Understanding   Class Start Time 1152   Class Stop Time 1233   Class Time Calculation (min) 41 min    Row Name 05/19/24 1200     Education   Cardiac Education Topics Pritikin   Nurse, Children's Exercise Physiologist   Select Psychosocial   Psychosocial Healthy Minds, Bodies, Hearts   Instruction Review Code 1- Verbalizes Understanding   Class Start Time 1145   Class Stop Time 1220   Class Time Calculation (min) 35 min    Row Name 05/21/24 1100     Education   Cardiac Education Topics Pritikin   Secondary School Teacher School   Educator Nurse;Respiratory Therapist   Weekly Topic Adding Flavor - Sodium-Free   Instruction Review Code 1- Verbalizes Understanding   Class Start Time 1145   Class Stop Time 1223   Class Time Calculation (min) 38 min    Row Name 05/23/24 1100     Education   Cardiac Education Topics Pritikin   Glass Blower/designer Nutrition   Nutrition Workshop Label Reading   Instruction Review Code 1- Verbalizes Understanding   Class Start Time 1145   Class Stop Time 1220   Class Time Calculation (min)  35 min  Row Name 05/26/24 1300     Education   Cardiac Education Topics Pritikin   Select Workshops     Workshops   Educator Exercise Physiologist   Select Exercise   Exercise Workshop Location Manager and Fall Prevention   Instruction Review Code 1- Verbalizes Understanding   Class Start Time 1154   Class Stop Time 1243   Class Time Calculation (min) 49 min    Row Name 05/28/24 1300     Education   Cardiac Education Topics Pritikin   Customer Service Manager   Weekly Topic Fast and Healthy Breakfasts   Instruction Review Code 1- Verbalizes Understanding   Class Start Time 1145   Class Stop Time 1225   Class Time Calculation (min) 40 min    Row Name 05/30/24 1200     Education   Cardiac Education Topics Pritikin   Licensed Conveyancer Nutrition   General Education Other   Instruction Review Code 1- Verbalizes Understanding   Class Start Time 1145   Class Stop Time 1230   Class Time Calculation (min) 45 min    Row Name 06/02/24 1200     Education   Cardiac Education Topics Pritikin   Hospital Doctor Education   General Education Metabolic Syndrome and Belly Fat   Instruction Review Code 1- Verbalizes Understanding   Class Start Time 1150   Class Stop Time 1240   Class Time Calculation (min) 50 min      Core Videos: Exercise    Move It!  Clinical staff conducted group or individual video education with verbal and written material and guidebook.  Patient learns the recommended Pritikin exercise program. Exercise with the goal of living a long, healthy life. Some of the health benefits of exercise include controlled diabetes, healthier blood pressure levels, improved cholesterol levels, improved heart and lung capacity, improved sleep, and better body composition. Everyone should speak with their doctor  before starting or changing an exercise routine.  Biomechanical Limitations Clinical staff conducted group or individual video education with verbal and written material and guidebook.  Patient learns how biomechanical limitations can impact exercise and how we can mitigate and possibly overcome limitations to have an impactful and balanced exercise routine.  Body Composition Clinical staff conducted group or individual video education with verbal and written material and guidebook.  Patient learns that body composition (ratio of muscle mass to fat mass) is a key component to assessing overall fitness, rather than body weight alone. Increased fat mass, especially visceral belly fat, can put us  at increased risk for metabolic syndrome, type 2 diabetes, heart disease, and even death. It is recommended to combine diet and exercise (cardiovascular and resistance training) to improve your body composition. Seek guidance from your physician and exercise physiologist before implementing an exercise routine.  Exercise Action Plan Clinical staff conducted group or individual video education with verbal and written material and guidebook.  Patient learns the recommended strategies to achieve and enjoy long-term exercise adherence, including variety, self-motivation, self-efficacy, and positive decision making. Benefits of exercise include fitness, good health, weight management, more energy, better sleep, less stress, and overall well-being.  Medical   Heart Disease Risk Reduction Clinical staff conducted group or individual video education with verbal and written material and guidebook.  Patient learns  our heart is our most vital organ as it circulates oxygen, nutrients, white blood cells, and hormones throughout the entire body, and carries waste away. Data supports a plant-based eating plan like the Pritikin Program for its effectiveness in slowing progression of and reversing heart disease. The video  provides a number of recommendations to address heart disease.   Metabolic Syndrome and Belly Fat  Clinical staff conducted group or individual video education with verbal and written material and guidebook.  Patient learns what metabolic syndrome is, how it leads to heart disease, and how one can reverse it and keep it from coming back. You have metabolic syndrome if you have 3 of the following 5 criteria: abdominal obesity, high blood pressure, high triglycerides, low HDL cholesterol, and high blood sugar.  Hypertension and Heart Disease Clinical staff conducted group or individual video education with verbal and written material and guidebook.  Patient learns that high blood pressure, or hypertension, is very common in the [place] . Hypertension is largely due to excessive salt intake, but other important risk factors include being overweight, physical inactivity, drinking too much alcohol, smoking, and not eating enough potassium from fruits and vegetables. High blood pressure is a leading risk factor for heart attack, stroke, congestive heart failure, dementia, kidney failure, and premature death. Long-term effects of excessive salt intake include stiffening of the arteries and thickening of heart muscle and organ damage. Recommendations include ways to reduce hypertension and the risk of heart disease.  Diseases of Our Time - Focusing on Diabetes Clinical staff conducted group or individual video education with verbal and written material and guidebook.  Patient learns why the best way to stop diseases of our time is prevention, through food and other lifestyle changes. Medicine (such as prescription pills and surgeries) is often only a Band-Aid on the problem, not a long-term solution. Most common diseases of our time include obesity, type 2 diabetes, hypertension, heart disease, and cancer. The Pritikin Program is recommended and has been proven to help reduce, reverse, and/or prevent the  damaging effects of metabolic syndrome.  Nutrition   Overview of the Pritikin Eating Plan  Clinical staff conducted group or individual video education with verbal and written material and guidebook.  Patient learns about the Pritikin Eating Plan for disease risk reduction. The Pritikin Eating Plan emphasizes a wide variety of unrefined, minimally-processed carbohydrates, like fruits, vegetables, whole grains, and legumes. Go, Caution, and Stop food choices are explained. Plant-based and lean animal proteins are emphasized. Rationale provided for low sodium intake for blood pressure control, low added sugars for blood sugar stabilization, and low added fats and oils for coronary artery disease risk reduction and weight management.  Calorie Density  Clinical staff conducted group or individual video education with verbal and written material and guidebook.  Patient learns about calorie density and how it impacts the Pritikin Eating Plan. Knowing the characteristics of the food you choose will help you decide whether those foods will lead to weight gain or weight loss, and whether you want to consume more or less of them. Weight loss is usually a side effect of the Pritikin Eating Plan because of its focus on low calorie-dense foods.  Label Reading  Clinical staff conducted group or individual video education with verbal and written material and guidebook.  Patient learns about the Pritikin recommended label reading guidelines and corresponding recommendations regarding calorie density, added sugars, sodium content, and whole grains.  Dining Out - Part 1  Clinical staff conducted group or  individual video education with verbal and written material and guidebook.  Patient learns that restaurant meals can be sabotaging because they can be so high in calories, fat, sodium, and/or sugar. Patient learns recommended strategies on how to positively address this and avoid unhealthy pitfalls.  Facts on Fats   Clinical staff conducted group or individual video education with verbal and written material and guidebook.  Patient learns that lifestyle modifications can be just as effective, if not more so, as many medications for lowering your risk of heart disease. A Pritikin lifestyle can help to reduce your risk of inflammation and atherosclerosis (cholesterol build-up, or plaque, in the artery walls). Lifestyle interventions such as dietary choices and physical activity address the cause of atherosclerosis. A review of the types of fats and their impact on blood cholesterol levels, along with dietary recommendations to reduce fat intake is also included.  Nutrition Action Plan  Clinical staff conducted group or individual video education with verbal and written material and guidebook.  Patient learns how to incorporate Pritikin recommendations into their lifestyle. Recommendations include planning and keeping personal health goals in mind as an important part of their success.  Healthy Mind-Set    Healthy Minds, Bodies, Hearts  Clinical staff conducted group or individual video education with verbal and written material and guidebook.  Patient learns how to identify when they are stressed. Video will discuss the impact of that stress, as well as the many benefits of stress management. Patient will also be introduced to stress management techniques. The way we think, act, and feel has an impact on our hearts.  How Our Thoughts Can Heal Our Hearts  Clinical staff conducted group or individual video education with verbal and written material and guidebook.  Patient learns that negative thoughts can cause depression and anxiety. This can result in negative lifestyle behavior and serious health problems. Cognitive behavioral therapy is an effective method to help control our thoughts in order to change and improve our emotional outlook.  Additional Videos:  Exercise    Improving Performance  Clinical  staff conducted group or individual video education with verbal and written material and guidebook.  Patient learns to use a non-linear approach by alternating intensity levels and lengths of time spent exercising to help burn more calories and lose more body fat. Cardiovascular exercise helps improve heart health, metabolism, hormonal balance, blood sugar control, and recovery from fatigue. Resistance training improves strength, endurance, balance, coordination, reaction time, metabolism, and muscle mass. Flexibility exercise improves circulation, posture, and balance. Seek guidance from your physician and exercise physiologist before implementing an exercise routine and learn your capabilities and proper form for all exercise.  Introduction to Yoga  Clinical staff conducted group or individual video education with verbal and written material and guidebook.  Patient learns about yoga, a discipline of the coming together of mind, breath, and body. The benefits of yoga include improved flexibility, improved range of motion, better posture and core strength, increased lung function, weight loss, and positive self-image. Yoga's heart health benefits include lowered blood pressure, healthier heart rate, decreased cholesterol and triglyceride levels, improved immune function, and reduced stress. Seek guidance from your physician and exercise physiologist before implementing an exercise routine and learn your capabilities and proper form for all exercise.  Medical   Aging: Enhancing Your Quality of Life  Clinical staff conducted group or individual video education with verbal and written material and guidebook.  Patient learns key strategies and recommendations to stay in good physical health and  enhance quality of life, such as prevention strategies, having an advocate, securing a Health Care Proxy and Power of Attorney, and keeping a list of medications and system for tracking them. It also discusses how to  avoid risk for bone loss.  Biology of Weight Control  Clinical staff conducted group or individual video education with verbal and written material and guidebook.  Patient learns that weight gain occurs because we consume more calories than we burn (eating more, moving less). Even if your body weight is normal, you may have higher ratios of fat compared to muscle mass. Too much body fat puts you at increased risk for cardiovascular disease, heart attack, stroke, type 2 diabetes, and obesity-related cancers. In addition to exercise, following the Pritikin Eating Plan can help reduce your risk.  Decoding Lab Results  Clinical staff conducted group or individual video education with verbal and written material and guidebook.  Patient learns that lab test reflects one measurement whose values change over time and are influenced by many factors, including medication, stress, sleep, exercise, food, hydration, pre-existing medical conditions, and more. It is recommended to use the knowledge from this video to become more involved with your lab results and evaluate your numbers to speak with your doctor.   Diseases of Our Time - Overview  Clinical staff conducted group or individual video education with verbal and written material and guidebook.  Patient learns that according to the CDC, 50% to 70% of chronic diseases (such as obesity, type 2 diabetes, elevated lipids, hypertension, and heart disease) are avoidable through lifestyle improvements including healthier food choices, listening to satiety cues, and increased physical activity.  Sleep Disorders Clinical staff conducted group or individual video education with verbal and written material and guidebook.  Patient learns how good quality and duration of sleep are important to overall health and well-being. Patient also learns about sleep disorders and how they impact health along with recommendations to address them, including discussing with a  physician.  Nutrition  Dining Out - Part 2 Clinical staff conducted group or individual video education with verbal and written material and guidebook.  Patient learns how to plan ahead and communicate in order to maximize their dining experience in a healthy and nutritious manner. Included are recommended food choices based on the type of restaurant the patient is visiting.   Fueling a Banker conducted group or individual video education with verbal and written material and guidebook.  There is a strong connection between our food choices and our health. Diseases like obesity and type 2 diabetes are very prevalent and are in large-part due to lifestyle choices. The Pritikin Eating Plan provides plenty of food and hunger-curbing satisfaction. It is easy to follow, affordable, and helps reduce health risks.  Menu Workshop  Clinical staff conducted group or individual video education with verbal and written material and guidebook.  Patient learns that restaurant meals can sabotage health goals because they are often packed with calories, fat, sodium, and sugar. Recommendations include strategies to plan ahead and to communicate with the manager, chef, or server to help order a healthier meal.  Planning Your Eating Strategy  Clinical staff conducted group or individual video education with verbal and written material and guidebook.  Patient learns about the Pritikin Eating Plan and its benefit of reducing the risk of disease. The Pritikin Eating Plan does not focus on calories. Instead, it emphasizes high-quality, nutrient-rich foods. By knowing the characteristics of the foods, we choose, we can determine  their calorie density and make informed decisions.  Targeting Your Nutrition Priorities  Clinical staff conducted group or individual video education with verbal and written material and guidebook.  Patient learns that lifestyle habits have a tremendous impact on disease  risk and progression. This video provides eating and physical activity recommendations based on your personal health goals, such as reducing LDL cholesterol, losing weight, preventing or controlling type 2 diabetes, and reducing high blood pressure.  Vitamins and Minerals  Clinical staff conducted group or individual video education with verbal and written material and guidebook.  Patient learns different ways to obtain key vitamins and minerals, including through a recommended healthy diet. It is important to discuss all supplements you take with your doctor.   Healthy Mind-Set    Smoking Cessation  Clinical staff conducted group or individual video education with verbal and written material and guidebook.  Patient learns that cigarette smoking and tobacco addiction pose a serious health risk which affects millions of people. Stopping smoking will significantly reduce the risk of heart disease, lung disease, and many forms of cancer. Recommended strategies for quitting are covered, including working with your doctor to develop a successful plan.  Culinary   Becoming a Set Designer conducted group or individual video education with verbal and written material and guidebook.  Patient learns that cooking at home can be healthy, cost-effective, quick, and puts them in control. Keys to cooking healthy recipes will include looking at your recipe, assessing your equipment needs, planning ahead, making it simple, choosing cost-effective seasonal ingredients, and limiting the use of added fats, salts, and sugars.  Cooking - Breakfast and Snacks  Clinical staff conducted group or individual video education with verbal and written material and guidebook.  Patient learns how important breakfast is to satiety and nutrition through the entire day. Recommendations include key foods to eat during breakfast to help stabilize blood sugar levels and to prevent overeating at meals later in the day.  Planning ahead is also a key component.  Cooking - Educational Psychologist conducted group or individual video education with verbal and written material and guidebook.  Patient learns eating strategies to improve overall health, including an approach to cook more at home. Recommendations include thinking of animal protein as a side on your plate rather than center stage and focusing instead on lower calorie dense options like vegetables, fruits, whole grains, and plant-based proteins, such as beans. Making sauces in large quantities to freeze for later and leaving the skin on your vegetables are also recommended to maximize your experience.  Cooking - Healthy Salads and Dressing Clinical staff conducted group or individual video education with verbal and written material and guidebook.  Patient learns that vegetables, fruits, whole grains, and legumes are the foundations of the Pritikin Eating Plan. Recommendations include how to incorporate each of these in flavorful and healthy salads, and how to create homemade salad dressings. Proper handling of ingredients is also covered. Cooking - Soups and [employer] - Soups and Desserts Clinical staff conducted group or individual video education with verbal and written material and guidebook.  Patient learns that Pritikin soups and desserts make for easy, nutritious, and delicious snacks and meal components that are low in sodium, fat, sugar, and calorie density, while high in vitamins, minerals, and filling fiber. Recommendations include simple and healthy ideas for soups and desserts.   Overview     The Pritikin Solution Program Overview Clinical staff conducted group or individual  video education with verbal and written material and guidebook.  Patient learns that the results of the Pritikin Program have been documented in more than 100 articles published in peer-reviewed journals, and the benefits include reducing risk factors for  (and, in some cases, even reversing) high cholesterol, high blood pressure, type 2 diabetes, obesity, and more! An overview of the three key pillars of the Pritikin Program will be covered: eating well, doing regular exercise, and having a healthy mind-set.  WORKSHOPS  Exercise: Exercise Basics: Building Your Action Plan Clinical staff led group instruction and group discussion with PowerPoint presentation and patient guidebook. To enhance the learning environment the use of posters, models and videos may be added. At the conclusion of this workshop, patients will comprehend the difference between physical activity and exercise, as well as the benefits of incorporating both, into their routine. Patients will understand the FITT (Frequency, Intensity, Time, and Type) principle and how to use it to build an exercise action plan. In addition, safety concerns and other considerations for exercise and cardiac rehab will be addressed by the presenter. The purpose of this lesson is to promote a comprehensive and effective weekly exercise routine in order to improve patients' overall level of fitness.   Managing Heart Disease: Your Path to a Healthier Heart Clinical staff led group instruction and group discussion with PowerPoint presentation and patient guidebook. To enhance the learning environment the use of posters, models and videos may be added.At the conclusion of this workshop, patients will understand the anatomy and physiology of the heart. Additionally, they will understand how Pritikin's three pillars impact the risk factors, the progression, and the management of heart disease.  The purpose of this lesson is to provide a high-level overview of the heart, heart disease, and how the Pritikin lifestyle positively impacts risk factors.  Exercise Biomechanics Clinical staff led group instruction and group discussion with PowerPoint presentation and patient guidebook. To enhance the learning  environment the use of posters, models and videos may be added. Patients will learn how the structural parts of their bodies function and how these functions impact their daily activities, movement, and exercise. Patients will learn how to promote a neutral spine, learn how to manage pain, and identify ways to improve their physical movement in order to promote healthy living. The purpose of this lesson is to expose patients to common physical limitations that impact physical activity. Participants will learn practical ways to adapt and manage aches and pains, and to minimize their effect on regular exercise. Patients will learn how to maintain good posture while sitting, walking, and lifting.  Balance Training and Fall Prevention  Clinical staff led group instruction and group discussion with PowerPoint presentation and patient guidebook. To enhance the learning environment the use of posters, models and videos may be added. At the conclusion of this workshop, patients will understand the importance of their sensorimotor skills (vision, proprioception, and the vestibular system) in maintaining their ability to balance as they age. Patients will apply a variety of balancing exercises that are appropriate for their current level of function. Patients will understand the common causes for poor balance, possible solutions to these problems, and ways to modify their physical environment in order to minimize their fall risk. The purpose of this lesson is to teach patients about the importance of maintaining balance as they age and ways to minimize their risk of falling.  WORKSHOPS   Nutrition:  Fueling a Ship Broker led group instruction and group discussion  with PowerPoint presentation and patient guidebook. To enhance the learning environment the use of posters, models and videos may be added. Patients will review the foundational principles of the Pritikin Eating Plan and understand  what constitutes a serving size in each of the food groups. Patients will also learn Pritikin-friendly foods that are better choices when away from home and review make-ahead meal and snack options. Calorie density will be reviewed and applied to three nutrition priorities: weight maintenance, weight loss, and weight gain. The purpose of this lesson is to reinforce (in a group setting) the key concepts around what patients are recommended to eat and how to apply these guidelines when away from home by planning and selecting Pritikin-friendly options. Patients will understand how calorie density may be adjusted for different weight management goals.  Mindful Eating  Clinical staff led group instruction and group discussion with PowerPoint presentation and patient guidebook. To enhance the learning environment the use of posters, models and videos may be added. Patients will briefly review the concepts of the Pritikin Eating Plan and the importance of low-calorie dense foods. The concept of mindful eating will be introduced as well as the importance of paying attention to internal hunger signals. Triggers for non-hunger eating and techniques for dealing with triggers will be explored. The purpose of this lesson is to provide patients with the opportunity to review the basic principles of the Pritikin Eating Plan, discuss the value of eating mindfully and how to measure internal cues of hunger and fullness using the Hunger Scale. Patients will also discuss reasons for non-hunger eating and learn strategies to use for controlling emotional eating.  Targeting Your Nutrition Priorities Clinical staff led group instruction and group discussion with PowerPoint presentation and patient guidebook. To enhance the learning environment the use of posters, models and videos may be added. Patients will learn how to determine their genetic susceptibility to disease by reviewing their family history. Patients will gain  insight into the importance of diet as part of an overall healthy lifestyle in mitigating the impact of genetics and other environmental insults. The purpose of this lesson is to provide patients with the opportunity to assess their personal nutrition priorities by looking at their family history, their own health history and current risk factors. Patients will also be able to discuss ways of prioritizing and modifying the Pritikin Eating Plan for their highest risk areas  Menu  Clinical staff led group instruction and group discussion with PowerPoint presentation and patient guidebook. To enhance the learning environment the use of posters, models and videos may be added. Using menus brought in from [employer], or printed from [employer], patients will apply the Pritikin dining out guidelines that were presented in the [employer] video. Patients will also be able to practice these guidelines in a variety of provided scenarios. The purpose of this lesson is to provide patients with the opportunity to practice hands-on learning of the Pritikin Dining Out guidelines with actual menus and practice scenarios.  Label Reading Clinical staff led group instruction and group discussion with PowerPoint presentation and patient guidebook. To enhance the learning environment the use of posters, models and videos may be added. Patients will review and discuss the Pritikin label reading guidelines presented in Pritikin's Label Reading Educational series video. Using fool labels brought in from local grocery stores and markets, patients will apply the label reading guidelines and determine if the packaged food meet the Pritikin guidelines. The purpose of this lesson is to  provide patients with the opportunity to review, discuss, and practice hands-on learning of the Pritikin Label Reading guidelines with actual packaged food labels. Cooking School  Pritikin's [employer] are  designed to teach patients ways to prepare quick, simple, and affordable recipes at home. The importance of nutrition's role in chronic disease risk reduction is reflected in its emphasis in the overall Pritikin program. By learning how to prepare essential core Pritikin Eating Plan recipes, patients will increase control over what they eat; be able to customize the flavor of foods without the use of added salt, sugar, or fat; and improve the quality of the food they consume. By learning a set of core recipes which are easily assembled, quickly prepared, and affordable, patients are more likely to prepare more healthy foods at home. These workshops focus on convenient breakfasts, simple entres, side dishes, and desserts which can be prepared with minimal effort and are consistent with nutrition recommendations for cardiovascular risk reduction. Cooking [employer] are taught by a armed forces logistics/support/administrative officer (RD) who has been trained by the [employer]. The chef or RD has a clear understanding of the importance of minimizing - if not completely eliminating - added fat, sugar, and sodium in recipes. Throughout the series of Cooking School Workshop sessions, patients will learn about healthy ingredients and efficient methods of cooking to build confidence in their capability to prepare    Cooking School weekly topics:  Adding Flavor- Sodium-Free  Fast and Healthy Breakfasts  Powerhouse Plant-Based Proteins  Satisfying Salads and Dressings  Simple Sides and Sauces  International Cuisine-Spotlight on the [employer] Zones  Delicious Desserts  Savory Soups  [employer] - Meals in a Astronomer Appetizers and Snacks  Comforting Weekend Breakfasts  One-Pot Wonders   Fast Evening Meals  Landscape Architect Your Pritikin Plate  WORKSHOPS   Healthy Mindset (Psychosocial):  Focused Goals, Sustainable Changes Clinical staff led group instruction and group discussion  with PowerPoint presentation and patient guidebook. To enhance the learning environment the use of posters, models and videos may be added. Patients will be able to apply effective goal setting strategies to establish at least one personal goal, and then take consistent, meaningful action toward that goal. They will learn to identify common barriers to achieving personal goals and develop strategies to overcome them. Patients will also gain an understanding of how our mind-set can impact our ability to achieve goals and the importance of cultivating a positive and growth-oriented mind-set. The purpose of this lesson is to provide patients with a deeper understanding of how to set and achieve personal goals, as well as the tools and strategies needed to overcome common obstacles which may arise along the way.  From Head to Heart: The Power of a Healthy Outlook  Clinical staff led group instruction and group discussion with PowerPoint presentation and patient guidebook. To enhance the learning environment the use of posters, models and videos may be added. Patients will be able to recognize and describe the impact of emotions and mood on physical health. They will discover the importance of self-care and explore self-care practices which may work for them. Patients will also learn how to utilize the 4 C's to cultivate a healthier outlook and better manage stress and challenges. The purpose of this lesson is to demonstrate to patients how a healthy outlook is an essential part of maintaining good health, especially as they continue their cardiac rehab journey.  Healthy Sleep for a Healthy Heart  Clinical staff led group instruction and group discussion with PowerPoint presentation and patient guidebook. To enhance the learning environment the use of posters, models and videos may be added. At the conclusion of this workshop, patients will be able to demonstrate knowledge of the importance of sleep to overall  health, well-being, and quality of life. They will understand the symptoms of, and treatments for, common sleep disorders. Patients will also be able to identify daytime and nighttime behaviors which impact sleep, and they will be able to apply these tools to help manage sleep-related challenges. The purpose of this lesson is to provide patients with a general overview of sleep and outline the importance of quality sleep. Patients will learn about a few of the most common sleep disorders. Patients will also be introduced to the concept of "sleep hygiene," and discover ways to self-manage certain sleeping problems through simple daily behavior changes. Finally, the workshop will motivate patients by clarifying the links between quality sleep and their goals of heart-healthy living.   Recognizing and Reducing Stress Clinical staff led group instruction and group discussion with PowerPoint presentation and patient guidebook. To enhance the learning environment the use of posters, models and videos may be added. At the conclusion of this workshop, patients will be able to understand the types of stress reactions, differentiate between acute and chronic stress, and recognize the impact that chronic stress has on their health. They will also be able to apply different coping mechanisms, such as reframing negative self-talk. Patients will have the opportunity to practice a variety of stress management techniques, such as deep abdominal breathing, progressive muscle relaxation, and/or guided imagery.  The purpose of this lesson is to educate patients on the role of stress in their lives and to provide healthy techniques for coping with it.  Learning Barriers/Preferences:  Learning Barriers/Preferences - 02/26/24 1150       Learning Barriers/Preferences   Learning Barriers Sight   reading glasses   Learning Preferences Group Instruction;Individual Instruction;Skilled Demonstration          Education  Topics:  Knowledge Questionnaire Score:  Knowledge Questionnaire Score - 05/13/24 0829       Knowledge Questionnaire Score   Pre Score 18/24    Post Score 23/24          Core Components/Risk Factors/Patient Goals at Admission:  Personal Goals and Risk Factors at Admission - 02/26/24 1152       Core Components/Risk Factors/Patient Goals on Admission    Weight Management Obesity;Weight Loss    Hypertension Yes    Intervention Provide education on lifestyle modifcations including regular physical activity/exercise, weight management, moderate sodium restriction and increased consumption of fresh fruit, vegetables, and low fat dairy, alcohol moderation, and smoking cessation.;Monitor prescription use compliance.    Expected Outcomes Short Term: Continued assessment and intervention until BP is < 140/72mm HG in hypertensive participants. < 130/34mm HG in hypertensive participants with diabetes, heart failure or chronic kidney disease.;Long Term: Maintenance of blood pressure at goal levels.    Lipids Yes    Intervention Provide education and support for participant on nutrition & aerobic/resistive exercise along with prescribed medications to achieve LDL 70mg , HDL >40mg .    Expected Outcomes Short Term: Participant states understanding of desired cholesterol values and is compliant with medications prescribed. Participant is following exercise prescription and nutrition guidelines.;Long Term: Cholesterol controlled with medications as prescribed, with individualized exercise RX and with personalized nutrition plan. Value goals: LDL < 70mg , HDL > 40 mg.  Stress Yes    Intervention Offer individual and/or small group education and counseling on adjustment to heart disease, stress management and health-related lifestyle change. Teach and support self-help strategies.;Refer participants experiencing significant psychosocial distress to appropriate mental health specialists for further evaluation  and treatment. When possible, include family members and significant others in education/counseling sessions.    Expected Outcomes Short Term: Participant demonstrates changes in health-related behavior, relaxation and other stress management skills, ability to obtain effective social support, and compliance with psychotropic medications if prescribed.;Long Term: Emotional wellbeing is indicated by absence of clinically significant psychosocial distress or social isolation.          Core Components/Risk Factors/Patient Goals Review:   Goals and Risk Factor Review     Row Name 03/03/24 1438 03/07/24 1447 04/04/24 1629 05/28/24 1644       Core Components/Risk Factors/Patient Goals Review   Personal Goals Review Hypertension;Lipids;Stress Hypertension;Lipids;Stress Hypertension;Lipids;Stress Hypertension;Lipids;Stress    Review Pt started exercise today (03/03/24). VSS. Tolerated exercise well. Tolerated exercise well. Pt doing well with exercise at cardiac rehab. Vital signs have been stable. [name] has increased his met levels. Tolerated exercise well. Pt doing well with exercise at cardiac rehab. Vital signs have been stable. [name] has increased his met levels. Pt is tolerating exercise well at cardiac rehab. VSS. [name] has increased her met levels over the last 30 days    Expected Outcomes Pt will continue to participate in cardiac rehab for exercise, nutrition, and lifestyle modifications Pt will continue to participate in cardiac rehab for exercise, nutrition, and lifestyle modifications Pt will continue to participate in cardiac rehab for exercise, nutrition, and lifestyle modifications Pt will continue to participate in cardiac rehab for exercise, nutrition, and lifestyle modifications       Core Components/Risk Factors/Patient Goals at Discharge (Final Review):   Goals and Risk Factor Review - 05/28/24 1644       Core Components/Risk Factors/Patient Goals Review   Personal Goals Review  Hypertension;Lipids;Stress    Review Pt is tolerating exercise well at cardiac rehab. VSS. [name] has increased her met levels over the last 30 days    Expected Outcomes Pt will continue to participate in cardiac rehab for exercise, nutrition, and lifestyle modifications          ITP Comments:  ITP Comments     Row Name 02/26/24 1050 03/03/24 1431 03/07/24 1444 04/04/24 1627 05/28/24 1643   ITP Comments Dr Wilbert Bihari MD, Medical Director. Introduction to [facility] Education program/ Intensive Cardiac Rehab. Initial Orientation Packet reviewed with the patient. 30 Day ITP Review. Pt started cardiac rehab today (03/03/24). Tolerated exercise well. 30 Day ITP Review. [name] has good attendance and participation with exercise in cardiac rehab. 30 Day ITP Review. [name] has good attendance and participation with exercise in cardiac rehab. 30 Day ITP Review. [name] continues to have good attendance and participation with exercise in cardiac rehab.      Comments: see ITP comments

## 2024-06-04 ENCOUNTER — Encounter (HOSPITAL_COMMUNITY)
Admission: RE | Admit: 2024-06-04 | Discharge: 2024-06-04 | Disposition: A | Source: Ambulatory Visit | Attending: Cardiology

## 2024-06-04 VITALS — Ht 60.5 in | Wt 160.1 lb

## 2024-06-04 DIAGNOSIS — Z952 Presence of prosthetic heart valve: Secondary | ICD-10-CM

## 2024-06-06 ENCOUNTER — Encounter (HOSPITAL_COMMUNITY)
Admission: RE | Admit: 2024-06-06 | Discharge: 2024-06-06 | Disposition: A | Discharge status: Home / Self Care | Source: Ambulatory Visit | Attending: Cardiology | Admitting: Cardiology

## 2024-06-06 ENCOUNTER — Encounter (HOSPITAL_COMMUNITY): Payer: Self-pay

## 2024-06-06 DIAGNOSIS — Z952 Presence of prosthetic heart valve: Secondary | ICD-10-CM | POA: Diagnosis not present

## 2024-06-07 ENCOUNTER — Other Ambulatory Visit: Payer: Self-pay | Admitting: Cardiology

## 2024-06-13 NOTE — Progress Notes (Incomplete)
 Daily Session Note  Patient Details  Name: Taylor Stanley MRN: [id number] Date of Birth: Feb 12, 1956 Referring Provider:   Flowsheet Row INTENSIVE CARDIAC REHAB ORIENT from 02/26/2024 in [facility]  Referring Provider Taylor Lawrence, MD    Encounter Date: 06/06/2024  Check In:   Capillary Blood Glucose: No results found for this or any previous visit (from the past 24 hours).    Social History   Tobacco Use  Smoking Status Former   Current packs/day: 0.00   Average packs/day: 1 pack/day for 13.0 years (13.0 ttl pk-yrs)   Types: Cigarettes   Start date: 02/11/1992   Quit date: 02/10/2005   Years since quitting: 19.3  Smokeless Tobacco Never    Goals Met:  Exercise tolerated well No report of concerns or symptoms today Strength training completed today  Goals Unmet:  Not Applicable  Comments: Pt graduates from  Intensive/Traditional cardiac rehab program today with completion of exercise and education sessions. Pt maintained good attendance and progressed nicely during their participation in rehab as evidenced by increased MET level.   Medication list reconciled. Repeat PHQ score-0.  Pt has made significant lifestyle changes and should be commended for her success.  Taylor Stanley achieved her goals during cardiac rehab.   Pt plans to continue exercise at her home and at the [employer] [facility] 4-5x.    {[facility] MEDICAL [phone]}

## 2024-06-23 NOTE — Addendum Note (Signed)
 Encounter addended by: Lennon Fairy BIRCH, RN on: 06/23/2024 10:06 AM  Actions taken: Pend clinical note, Delete clinical note

## 2024-06-23 NOTE — Progress Notes (Signed)
 Discharge Progress Report  Patient Details  Name: Taylor Stanley MRN: [id number] Date of Birth: 23-Nov-1955 Referring Provider:   Flowsheet Row INTENSIVE CARDIAC REHAB ORIENT from 02/26/2024 in [facility]  Referring Provider Newman Lawrence, MD     Number of Visits: 18  Reason for Discharge:  Patient reached a stable level of exercise. Patient independent in their exercise. Patient has met program and personal goals.  Smoking History:  Social History   Tobacco Use  Smoking Status Former   Current packs/day: 0.00   Average packs/day: 1 pack/day for 13.0 years (13.0 ttl pk-yrs)   Types: Cigarettes   Start date: 02/11/1992   Quit date: 02/10/2005   Years since quitting: 19.3  Smokeless Tobacco Never    Diagnosis:  No diagnosis found.  ADL UCSD:   Initial Exercise Prescription:   Discharge Exercise Prescription (Final Exercise Prescription Changes):  Exercise Prescription Changes - 06/06/24 1500       Response to Exercise   Blood Pressure (Admit) 118/82    Blood Pressure (Exit) 118/64    Heart Rate (Admit) 59 bpm    Heart Rate (Exercise) 97 bpm    Heart Rate (Exit) 68 bpm    Rating of Perceived Exertion (Exercise) 9    Symptoms None    Comments Pt graduated from the CRP2 program    Duration Continue with 30 min of aerobic exercise without signs/symptoms of physical distress.    Intensity THRR unchanged      Progression   Progression Continue to progress workloads to maintain intensity without signs/symptoms of physical distress.    Average METs 3.85      Resistance Training   Training Prescription Yes    Weight 2 lbs    Reps 10-15    Time 5 Minutes      Interval Training   Interval Training No      Treadmill   MPH 2.8    Grade 1    Minutes 15    METs 3.53      NuStep   Level 5    SPM 95    Minutes 15    METs 4.2      Home Exercise Plan   Plans to continue exercise at Home (comment)     Frequency Add 2 additional days to program exercise sessions.    Initial Home Exercises Provided 05/05/24          Functional Capacity:  6 Minute Walk     Row Name 06/04/24 1306         6 Minute Walk   Phase Discharge     Distance 1730 feet     Distance % Change 14.3 %     Distance Feet Change 216 ft     Walk Time 6 minutes     # of Rest Breaks 0     MPH 3.3     METS 3.4     RPE 9     Perceived Dyspnea  1     VO2 Peak 11.9     Symptoms No     Resting HR 70 bpm     Resting BP 120/60     Max Ex. HR 93 bpm     Max Ex. BP 144/80        Psychological, QOL, Others - Outcomes: PHQ 2/9:    06/13/2024    2:29 PM 02/26/2024    1:17 PM 07/21/2013    4:02 PM  Depression screen PHQ 2/9  Decreased Interest 0 0 0  Down, Depressed, Hopeless 0 0 0  PHQ - 2 Score 0 0 0  Altered sleeping 0 0   Tired, decreased energy 0 1   Change in appetite 0 0   Feeling bad or failure about yourself  0 0   Trouble concentrating 0 0   Moving slowly or fidgety/restless 0 0   Suicidal thoughts 0 0   PHQ-9 Score 0 1    Difficult doing work/chores Not difficult at all Not difficult at all      Data saved with a previous flowsheet row definition    Quality of Life:  Quality of Life - 05/13/24 0832       Quality of Life Scores   Health/Function Pre 25.2 %    Health/Function Post 24.23 %    Health/Function % Change -3.85 %    Socioeconomic Pre 27.14 %    Socioeconomic Post 22.5 %    Socioeconomic % Change  -17.1 %    Psych/Spiritual Pre 28.93 %    Psych/Spiritual Post 28.29 %    Psych/Spiritual % Change -2.21 %    Family Pre 28 %    Family Post 27.6 %    Family % Change -1.43 %    GLOBAL Pre 26.78 %    GLOBAL Post 25.38 %    GLOBAL % Change -5.23 %          Personal Goals: Goals established at orientation with interventions provided to work toward goal.    Personal Goals Discharge:  Goals and Risk Factor Review     Row Name 05/28/24 1644             Core  Components/Risk Factors/Patient Goals Review   Personal Goals Review Hypertension;Lipids;Stress       Review Pt is tolerating exercise well at cardiac rehab. VSS. Taylor Stanley has increased her met levels over the last 30 days       Expected Outcomes Pt will continue to participate in cardiac rehab for exercise, nutrition, and lifestyle modifications          Exercise Goals and Review:   Exercise Goals Re-Evaluation:  Exercise Goals Re-Evaluation     Row Name 05/02/24 1646 05/28/24 1500 06/06/24 1500         Exercise Goal Re-Evaluation   Exercise Goals Review Increase Physical Activity;Understanding of Exercise Prescription;Increase Strength and Stamina;Knowledge and understanding of Target Heart Rate Range (THRR);Able to understand and use rate of perceived exertion (RPE) scale Increase Physical Activity;Understanding of Exercise Prescription;Increase Strength and Stamina;Knowledge and understanding of Target Heart Rate Range (THRR);Able to understand and use rate of perceived exertion (RPE) scale Increase Physical Activity;Understanding of Exercise Prescription;Increase Strength and Stamina;Knowledge and understanding of Target Heart Rate Range (THRR);Able to understand and use rate of perceived exertion (RPE) scale     Comments Reviewed goals and METs with patient today. Pt voices continued progress on her goals of increased endurance and muscular strength. Pt's weight change from basline is 1 kg. Reviewed goals and METs with patient today. Pt voices continued progress on her goals of increased endurance and muscular strength. Pt's weight has not had any significant change since last review. Pt graduated from the CRP2 program today. Pt voices continued progress on her goals of increased endurance and muscular strength. Pt had goal of weight loss; pt lost 1.9 kg since starting the program. Pt had peak METs of 4.2 during the program. Pt plans to contine her exercise  by going to the gym and using the  treadmill and the rower. Pt will continue also to walk at home and use her bike. Pt is at goal for 150 minutes per week of exercise.     Expected Outcomes Will continue to monitor the patient and increase exercise workloads as tolerated. Will continue to monitor the patient and increase exercise workloads as tolerated. Pt will continiue to walk/ride her bike at home, and go to the gym to use treadmill and rower.        Nutrition & Weight - Outcomes:   Post Biometrics - 06/04/24 1257        Post  Biometrics   Height 5' 0.5 (1.537 m)    Weight 160 lb 0.9 oz (72.6 kg)    Waist Circumference 39 inches    Hip Circumference 42.5 inches    Waist to Hip Ratio 0.92 %    BMI (Calculated) 30.73    Triceps Skinfold 29 mm    % Body Fat 43 %    Grip Strength 16 kg    Flexibility 16 in    Single Leg Stand 4.52 seconds          Nutrition:   Nutrition Discharge:  Nutrition Assessments - 05/13/24 0836       Rate Your Plate Scores   Pre Score 61          Education Questionnaire Score:  Knowledge Questionnaire Score - 05/13/24 0829       Knowledge Questionnaire Score   Pre Score 18/24    Post Score 23/24         Pt graduates from  Intensive/Traditional cardiac rehab program today with completion of 67 exercise and education sessions. Pt maintained good attendance and progressed nicely during their participation in rehab as evidenced by increased MET level.   Medication list reconciled. Repeat PHQ score-0.  Pt has made significant lifestyle changes and should be commended for her success.  Taylor Stanley achieved her goals during cardiac rehab.   Pt plans to continue exercise at her home and [facility] [facility]x.  Goals reviewed with patient; copy given to patient.

## 2024-07-18 ENCOUNTER — Ambulatory Visit: Attending: Cardiology | Admitting: Cardiology

## 2024-07-18 ENCOUNTER — Encounter: Payer: Self-pay | Admitting: Cardiology

## 2024-07-18 VITALS — BP 104/62 | HR 57 | Ht 65.0 in | Wt 161.5 lb

## 2024-07-18 DIAGNOSIS — I2510 Atherosclerotic heart disease of native coronary artery without angina pectoris: Secondary | ICD-10-CM | POA: Diagnosis not present

## 2024-07-18 DIAGNOSIS — Z952 Presence of prosthetic heart valve: Secondary | ICD-10-CM | POA: Diagnosis not present

## 2024-07-18 DIAGNOSIS — E782 Mixed hyperlipidemia: Secondary | ICD-10-CM | POA: Insufficient documentation

## 2024-07-18 DIAGNOSIS — I10 Essential (primary) hypertension: Secondary | ICD-10-CM | POA: Insufficient documentation

## 2024-07-18 NOTE — Progress Notes (Signed)
 " Cardiology Office Note:  .   Date:  07/18/2024  ID:  Taylor Stanley, DOB [birth year], MRN [id number] PCP: [name], NP  [facility] Providers Cardiologist:  Newman Lawrence, MD PCP: [name], NP  Chief Complaint  Patient presents with   Coronary Artery Disease      History of Present Illness: .    Taylor Stanley is a 68 y.o. female with hypertension, rheumatoid arthritis, elevated CAC, nonobstructive CAD, s/p TAVR (01/2024) for severer AS  Since her last visit with me, patient underwent successful TAVR in 01/2024 with echocardiogram in 02/2024 showing well-functioning valve.  She denies any complains of chest pain or shortness of breath.  She is compliant with her medical therapy.  Blood pressure is on the lower side.  She has occasional lightheadedness.   Vitals:   07/18/24 0930  BP: 104/62  Pulse: (!) 57  SpO2: 96%       ROS:  Review of Systems  Cardiovascular:  Negative for chest pain, dyspnea on exertion, leg swelling, palpitations and syncope.  Neurological:  Positive for light-headedness.     Studies Reviewed: Taylor Stanley        EKG 10/12/2023:  Sinus rhythm 57 bpm Moderate voltage criteria for LVH, may be normal variant ( R in aVL , Cornell product ) When compared with ECG of 05-Oct-2017 13:23, No significant change was found    Echocardiogram 02/2024:  1. Left ventricular ejection fraction, by estimation, is 60 to 65%. Left  ventricular ejection fraction by 3D volume is 60 %. The left ventricle has  normal function. The left ventricle has no regional wall motion  abnormalities. There is moderate asymmetric left ventricular hypertrophy of the basal-septal segment. Left ventricular diastolic parameters are consistent with Grade I diastolic dysfunction (impaired relaxation). The average left ventricular global  longitudinal strain is -19.9 %. The global longitudinal strain is normal.   2. Right ventricular systolic function is normal. The  right ventricular  size is normal.   3. Normal left atrial strain.   4. The mitral valve is normal in structure. Mild mitral valve  regurgitation. No evidence of mitral stenosis.   5. The aortic valve has been repaired/replaced. Aortic valve  regurgitation is mild. There is a 23 mm Sapien prosthetic (TAVR) valve  present in the aortic position. Procedure Date: 01/29/2024. Aortic valve  mean gradient measures 17.0 mmHg. Aortic valve Vmax measures 2.76 m/s.   6. The inferior vena cava is normal in size with greater than 50%  respiratory variability, suggesting right atrial pressure of 3 mmHg.   TAVR 01/2024 Cathey Sapien 3)  Coronary angiography 11/26/2023: LM: Normal LAD: No significant disease Lcx: No significant disease RCA: Large, dominant vessel           Prox 50% calcific stenosis   Right heart catheterization 11/26/2023: RA: 7 mmHg RV: 38/6 mmHg PA: 33/10 mmHg, mPAP 19 mmHg PCW: 9 mmHg   AO sats: 96% PA sats: 75%   CO: 5.7 L/min CI: 3.3 L/min/m2  Labs 01/2024: Hb 12.6 Cr 0.7  Labs 08/2023: Chol 120, TG 83, HDL 39, LDL 57 HbA1C 5.7% Hb 12.6 Cr 0.7      Physical Exam:   Physical Exam Vitals and nursing note reviewed.  Constitutional:      General: She is not in acute distress. Neck:     Vascular: No JVD.  Cardiovascular:     Rate and Rhythm: Normal rate and regular rhythm.     Heart sounds:  No murmur heard. Pulmonary:     Effort: Pulmonary effort is normal.     Breath sounds: Normal breath sounds. No wheezing or rales.  Musculoskeletal:     Right lower leg: No edema.     Left lower leg: No edema.      VISIT DIAGNOSES:   ICD-10-CM   1. Coronary artery disease involving native coronary artery of native heart without angina pectoris  I25.10     2. S/P TAVR (transcatheter aortic valve replacement)  Z95.2     3. Essential hypertension  I10     4. Mixed hyperlipidemia  E78.2          ASSESSMENT AND PLAN: .    Taylor Stanley is a 68 y.o.  female with hypertension, rheumatoid arthritis, elevated CAC, nonobstructive CAD, s/p TAVR (01/2024) for severer AS   Severe aortic stenosis: Now s/p TAVR (01/2024). Valve functioning well on echocardiogram 02/2024. Continue Aspirin  81 mg daily.  Has azithromycin  for antibiotic prophylaxis.  CAD: Proximal RCA 50% stenosis.  No anginal symptoms.   Continue aspirin , statin.   In absence of anginal symptoms and given low blood pressures, okay to stop carvedilol .    Hypertension: Blood pressure 104/62 on carvedilol  3.125 mg twice daily, and olmesartan-hydrochlorothiazide 40-25 mg daily. Stop carvedilol .  Okay to reduce olmesartan-hydrochlorothiazide to half tablet daily if blood pressure continues to remain low.  Continue to push hydration.  Mixed hyperlipidemia: Continue Crestor  20 mg daily. Annual lipid panel with PCP next month.       F/u w/me in 1 year  Signed, Newman JINNY Lawrence, MD  "

## 2024-07-18 NOTE — Patient Instructions (Addendum)
 Medication Instructions:   Stop taking  Carvedilol    *If you need a refill on your cardiac medications before your next appointment, please call your pharmacy*   Lab Work: Not needed If you have labs (blood work) drawn today and your tests are completely normal, you will receive your results only by: MyChart Message (if you have MyChart) OR A paper copy in the mail If you have any lab test that is abnormal or we need to change your treatment, we will call you to review the results.   Testing/Procedures:  Not needed  Follow-Up: At [facility], you and your health needs are our priority.  As part of our continuing mission to provide you with exceptional heart care, we have created designated Provider Care Teams.  These Care Teams include your primary Cardiologist (physician) and Advanced Practice Providers (APPs -  Physician Assistants and Nurse Practitioners) who all work together to provide you with the care you need, when you need it.     Your next appointment:   12 month(s)  The format for your next appointment:   In Person  Provider:   Newman JINNY Lawrence, MD

## 2024-07-23 ENCOUNTER — Other Ambulatory Visit: Payer: Self-pay | Admitting: Cardiology

## 2024-08-27 ENCOUNTER — Telehealth (HOSPITAL_BASED_OUTPATIENT_CLINIC_OR_DEPARTMENT_OTHER): Payer: Self-pay

## 2024-08-27 NOTE — Telephone Encounter (Signed)
 Hi Dr. Patwardan, you recently saw this patient in clinic on 07/18/2024. Are you able to comment on surgical clearance for upcoming colonoscopy scheduled for 10/06/2024? Please route your response to P CV DIV PREOP. Thank you!  ~Dandra Shambaugh

## 2024-08-27 NOTE — Telephone Encounter (Signed)
"  ° °  Pre-operative Risk Assessment    Patient Name: Taylor Stanley  DOB: 04-27-1956 MRN: [id number]   Date of last office visit: 07/18/24 with Dr. Elmira Date of next office visit: 02/02/25 with Sebastian   Request for Surgical Clearance    Procedure:  Colonoscopy  Date of Surgery:  Clearance 10/06/24                                  Surgeon:  Dr. Dianna Surgeon's Group or Practice Name:  [facility]  Phone number:  [phone] Fax number:  [phone]   Type of Clearance Requested:   - Medical  - Pharmacy:  Hold Aspirin  not indicated   Type of Anesthesia:  Propofol    Additional requests/questions:    Bonney Augustin JONETTA Delores   08/27/2024, 10:01 AM   "

## 2024-08-27 NOTE — Telephone Encounter (Signed)
"  ° °  Patient Name: Taylor Stanley  DOB: 1956-02-03 MRN: [id number]  Primary Cardiologist: Newman JINNY Lawrence, MD  Chart reviewed as part of pre-operative protocol coverage. Patient was recently seen by Dr. Lawrence in 06/2024. Per Dr. Lawrence: No cardiac risk, okay to proceed. If possible, continue aspirin  without interruption, given TAVR valve placement within last 6 months. If aspirin  must be held, please limit interruption to no more than 3 days.  I will route this recommendation to the requesting party via Epic fax function and remove from pre-op pool.  Please call with questions.  Mirel Hundal E Sarann Tregre, PA-C 08/27/2024, 1:17 PM  "

## 2024-08-27 NOTE — Telephone Encounter (Signed)
 No cardiac risk, okay to proceed. If possible, continue aspirin  without interruption, given TAVR valve placement within last 6 months. If aspirin  must be held, please limit interruption to no more than 3 days.  Thanks MJP

## 2025-02-02 ENCOUNTER — Ambulatory Visit: Admitting: Physician Assistant

## 2025-02-02 ENCOUNTER — Other Ambulatory Visit (HOSPITAL_COMMUNITY)

## 2025-05-19 ENCOUNTER — Ambulatory Visit: Admitting: Allergy and Immunology
